# Patient Record
Sex: Female | Born: 1990
Health system: Southern US, Community
[De-identification: ages and names within clinical notes are randomized; demographics above are authoritative.]

## PROBLEM LIST (undated history)

## (undated) DIAGNOSIS — R519 Headache, unspecified: Secondary | ICD-10-CM

## (undated) DIAGNOSIS — G8929 Other chronic pain: Secondary | ICD-10-CM

## (undated) DIAGNOSIS — G43909 Migraine, unspecified, not intractable, without status migrainosus: Secondary | ICD-10-CM

## (undated) DIAGNOSIS — K589 Irritable bowel syndrome without diarrhea: Secondary | ICD-10-CM

## (undated) DIAGNOSIS — T7840XA Allergy, unspecified, initial encounter: Secondary | ICD-10-CM

## (undated) DIAGNOSIS — F419 Anxiety disorder, unspecified: Secondary | ICD-10-CM

## (undated) DIAGNOSIS — K219 Gastro-esophageal reflux disease without esophagitis: Secondary | ICD-10-CM

## (undated) DIAGNOSIS — F32A Depression, unspecified: Secondary | ICD-10-CM

## (undated) DIAGNOSIS — F909 Attention-deficit hyperactivity disorder, unspecified type: Secondary | ICD-10-CM

## (undated) HISTORY — DX: Anxiety disorder, unspecified: F41.9

## (undated) HISTORY — DX: Other chronic pain: G89.29

## (undated) HISTORY — DX: Headache, unspecified: R51.9

## (undated) HISTORY — DX: Gastro-esophageal reflux disease without esophagitis: K21.9

## (undated) HISTORY — DX: Migraine, unspecified, not intractable, without status migrainosus: G43.909

## (undated) HISTORY — DX: Depression, unspecified: F32.A

## (undated) HISTORY — DX: Allergy, unspecified, initial encounter: T78.40XA

## (undated) HISTORY — DX: Irritable bowel syndrome, unspecified: K58.9

---

## 2015-07-06 ENCOUNTER — Ambulatory Visit: Payer: Self-pay | Admitting: Family Medicine

## 2018-09-21 ENCOUNTER — Encounter: Payer: Self-pay | Admitting: Nurse Practitioner

## 2018-09-21 ENCOUNTER — Other Ambulatory Visit (HOSPITAL_COMMUNITY)
Admission: RE | Admit: 2018-09-21 | Discharge: 2018-09-21 | Disposition: A | Discharge status: Home / Self Care | Payer: 59 | Source: Ambulatory Visit | Attending: Nurse Practitioner | Admitting: Nurse Practitioner

## 2018-09-21 ENCOUNTER — Ambulatory Visit (INDEPENDENT_AMBULATORY_CARE_PROVIDER_SITE_OTHER): Payer: 59 | Admitting: Nurse Practitioner

## 2018-09-21 VITALS — BP 102/80 | HR 83 | Temp 98.1°F | Ht 67.0 in | Wt 161.0 lb

## 2018-09-21 DIAGNOSIS — Z0000 Encounter for general adult medical examination without abnormal findings: Secondary | ICD-10-CM

## 2018-09-21 DIAGNOSIS — Z136 Encounter for screening for cardiovascular disorders: Secondary | ICD-10-CM

## 2018-09-21 DIAGNOSIS — Z124 Encounter for screening for malignant neoplasm of cervix: Secondary | ICD-10-CM | POA: Insufficient documentation

## 2018-09-21 DIAGNOSIS — Z13220 Encounter for screening for lipoid disorders: Secondary | ICD-10-CM

## 2018-09-21 DIAGNOSIS — Z30011 Encounter for initial prescription of contraceptive pills: Secondary | ICD-10-CM | POA: Insufficient documentation

## 2018-09-21 LAB — COMPREHENSIVE METABOLIC PANEL
ALBUMIN: 4.2 g/dL (ref 3.5–5.2)
ALK PHOS: 57 U/L (ref 39–117)
ALT: 12 U/L (ref 0–35)
AST: 15 U/L (ref 0–37)
BUN: 7 mg/dL (ref 6–23)
CALCIUM: 9 mg/dL (ref 8.4–10.5)
CHLORIDE: 109 meq/L (ref 96–112)
CO2: 25 mEq/L (ref 19–32)
CREATININE: 0.65 mg/dL (ref 0.40–1.20)
GFR: 117.84 mL/min (ref >60.00)
Glucose, Bld: 95 mg/dL (ref 70–99)
POTASSIUM: 3.8 meq/L (ref 3.5–5.1)
SODIUM: 141 meq/L (ref 135–145)
Total Bilirubin: 0.4 mg/dL (ref 0.2–1.2)
Total Protein: 6.7 g/dL (ref 6.0–8.3)

## 2018-09-21 LAB — CBC
HEMATOCRIT: 37.1 % (ref 36.0–46.0)
HEMOGLOBIN: 12.6 g/dL (ref 12.0–15.0)
MCHC: 34 g/dL (ref 30.0–36.0)
MCV: 91 fl (ref 78.0–100.0)
PLATELETS: 266 10*3/uL (ref 150.0–400.0)
RBC: 4.07 Mil/uL (ref 3.87–5.11)
RDW: 12.3 % (ref 11.5–15.5)
WBC: 4.6 10*3/uL (ref 4.0–10.5)

## 2018-09-21 LAB — POCT URINE PREGNANCY: PREG TEST UR: NEGATIVE

## 2018-09-21 LAB — LIPID PANEL
CHOL/HDL RATIO: 3
Cholesterol: 125 mg/dL (ref 0–200)
HDL: 47.3 mg/dL (ref >39.00)
LDL Cholesterol: 65 mg/dL (ref 0–99)
NONHDL: 77.36
TRIGLYCERIDES: 60 mg/dL (ref 0.0–149.0)
VLDL: 12 mg/dL (ref 0.0–40.0)

## 2018-09-21 LAB — TSH: TSH: 2.29 u[IU]/mL (ref 0.35–4.50)

## 2018-09-21 MED ORDER — NORETHIN ACE-ETH ESTRAD-FE 1-20 MG-MCG PO TABS: 1.0000 | ORAL_TABLET | Freq: Every day | ORAL | 3 refills | Status: DC

## 2018-09-21 NOTE — Progress Notes (Signed)
Subjective:    Patient ID: Melanie Jordan, female    DOB: [birth year], 27 y.o.   MRN: [id number]  Patient presents today for complete physical and establish care.  HPI  no pcp since 2016  Chest wall pain: Onset 1week ago, intermittent, no known aggravating factor, no other associated symptoms, no related to exertion. Improves with deep breathing.  Hx of anxiety, denies any increased anxiety at this time, denies any depression.  Sexual History (orientation,birth control, marital status, STD):sexually active, no contraception, will like to resume oral contraception. No hx of abnormal pap smear, completed HPV vaccine in 2016 while in [place]  Depression/Suicide: Depression screen PHQ 2/9 09/21/2018  Decreased Interest 0  Down, Depressed, Hopeless 0  PHQ - 2 Score 0   Vision:will schedule  Dental:will schedule  Immunizations: (TDAP, Hep C screen, Pneumovax, Influenza, zoster)  Health Maintenance  Topic Date Due  . Pap Smear  07/06/2014  . HIV Screening  09/22/2019*  . Tetanus Vaccine  05/25/2025  . Flu Shot  Completed  *Topic was postponed. The date shown is not the original due date.   Diet:regular.  Weight:  Wt Readings from Last 3 Encounters:  09/21/18 161 lb (73 kg)   Exercise:walking,  Fall Risk: Fall Risk  09/21/2018  Falls in the past year? No   Home Safety:home alone  Medications and allergies reviewed with patient and updated if appropriate.  Patient Active Problem List   Diagnosis Date Noted  . Initiation of oral contraception 09/21/2018    No current outpatient medications on file prior to visit.   No current facility-administered medications on file prior to visit.     Past Medical History:  Diagnosis Date  . GERD (gastroesophageal reflux disease)     History reviewed. No pertinent surgical history.  Social History   Socioeconomic History  . Marital status: Single    Spouse name: Not on file  . Number of children: 0  . Years of  education: Not on file  . Highest education level: Associate degree: occupational, Hotel manager, or vocational program  Occupational History    Comment: radiology technician (inpatient)  Social Needs  . Financial resource strain: Not on file  . Food insecurity:    Worry: Not on file    Inability: Not on file  . Transportation needs:    Medical: Not on file    Non-medical: Not on file  Tobacco Use  . Smoking status: Never Smoker  . Smokeless tobacco: Never Used  Substance and Sexual Activity  . Alcohol use: Yes    Comment: social  . Drug use: Never  . Sexual activity: Yes    Birth control/protection: None  Lifestyle  . Physical activity:    Days per week: Not on file    Minutes per session: Not on file  . Stress: Not on file  Relationships  . Social connections:    Talks on phone: Not on file    Gets together: Not on file    Attends religious service: Not on file    Active member of club or organization: Not on file    Attends meetings of clubs or organizations: Not on file    Relationship status: Not on file  Other Topics Concern  . Not on file  Social History Narrative  . Not on file    Family History  Problem Relation Age of Onset  . Cancer Mother 30       cervical cancer  . Asthma Brother   .  Arthritis Maternal Grandmother   . Depression Maternal Grandmother   . Heart disease Maternal Grandmother 63       CAD with stents  . Hyperlipidemia Maternal Grandmother   . Hypertension Maternal Grandmother   . Stroke Maternal Grandmother 72       TIA  . Cancer Paternal Grandmother 65       uterine cancer  . Asthma Brother        Review of Systems  Constitutional: Negative for fever, malaise/fatigue and weight loss.  HENT: Negative for congestion and sore throat.   Eyes:       Negative for visual changes  Respiratory: Negative for cough and shortness of breath.   Cardiovascular: Positive for chest pain. Negative for palpitations, orthopnea and leg swelling.    Gastrointestinal: Negative for abdominal pain, blood in stool, constipation, diarrhea, heartburn, melena, nausea and vomiting.  Genitourinary: Negative for dysuria, frequency and urgency.  Musculoskeletal: Negative for falls, joint pain and myalgias.  Skin: Negative for rash.  Neurological: Negative for dizziness, sensory change and headaches.  Endo/Heme/Allergies: Does not bruise/bleed easily.  Psychiatric/Behavioral: Negative for depression, substance abuse and suicidal ideas. The patient is not nervous/anxious.     Objective:   Vitals:   09/21/18 0908  BP: 102/80  Pulse: 83  Temp: 98.1 F (36.7 C)  SpO2: 99%    Body mass index is 25.22 kg/m.   Physical Examination:  Physical Exam  Constitutional: She is oriented to person, place, and time. No distress.  HENT:  Right Ear: External ear normal.  Left Ear: External ear normal.  Nose: Nose normal.  Mouth/Throat: No oropharyngeal exudate.  Eyes: Pupils are equal, round, and reactive to light. Conjunctivae and EOM are normal. No scleral icterus.  Neck: Normal range of motion. Neck supple. No thyromegaly present.  Cardiovascular: Normal rate, regular rhythm, normal heart sounds and intact distal pulses.  Pulmonary/Chest: Effort normal and breath sounds normal. She exhibits no tenderness. Right breast exhibits no mass, no nipple discharge and no skin change. Left breast exhibits no mass, no nipple discharge and no skin change. Breasts are symmetrical.  Abdominal: Soft. Bowel sounds are normal. She exhibits no distension. There is no tenderness.  Genitourinary: Rectum normal and vagina normal. Pelvic exam was performed with patient supine. There is no rash, tenderness or lesion on the right labia. There is no rash, tenderness or lesion on the left labia. Cervix exhibits no motion tenderness, no discharge and no friability. No erythema in the vagina. No vaginal discharge found.  Genitourinary Comments: Chaperone present   Musculoskeletal: Normal range of motion. She exhibits no edema or tenderness.  Lymphadenopathy:    She has no cervical adenopathy. No inguinal adenopathy noted on the right side.  Neurological: She is alert and oriented to person, place, and time.  Skin: Skin is warm and dry. No rash noted.  Psychiatric: She has a normal mood and affect. Her behavior is normal. Thought content normal.  Vitals reviewed.   ASSESSMENT and PLAN:  Ricci was seen today for establish care.  Diagnoses and all orders for this visit:  Preventative health care -     CBC -     Comprehensive metabolic panel -     TSH -     Lipid panel  Encounter for lipid screening for cardiovascular disease -     Lipid panel  Initiation of oral contraception -     POCT urine pregnancy -     norethindrone-ethinyl estradiol (JUNEL FE,GILDESS FE,LOESTRIN FE) 1-20  MG-MCG tablet; Take 1 tablet by mouth daily.  Encounter for Papanicolaou smear for cervical cancer screening -     Cytology - PAP   No problem-specific Assessment & Plan notes found for this encounter.     Follow up: Return if symptoms worsen or fail to improve.  Wilfred Lacy, NP

## 2018-09-21 NOTE — Patient Instructions (Addendum)
Negative urine pregnancy. Normal lab results. Oral contraceptive sent Pending PAP.  Chest wall pain may be related to muscle strain. Symptoms do not indicate GERD or cardiovascular disease. Okay to use tylenol or ibuprofen for pain.  Preventive Care for Melanie Jordan, Female The transition to life after high school as a young adult can be a stressful time with many changes. You may start seeing a primary care physician instead of a pediatrician. This is the time when your health care becomes your responsibility. Preventive care refers to lifestyle choices and visits with your health care provider that can promote health and wellness. What does preventive care include?  A yearly physical exam. This is also called an annual wellness visit.  Dental exams once or twice a year.  Routine eye exams. Ask your health care provider how often you should have your eyes checked.  Personal lifestyle choices, including: ? Daily care of your teeth and gums. ? Regular physical activity. ? Eating a healthy diet. ? Avoiding tobacco and drug use. ? Avoiding or limiting alcohol use. ? Practicing safe sex. ? Taking vitamin and mineral supplements as recommended by your health care provider. What happens during an annual wellness visit? Preventive care starts with a yearly visit to your primary care physician. The services and screenings done by your health care provider during your annual wellness visit will depend on your overall health, lifestyle risk factors, and family history of disease. Counseling Your health care provider may ask you questions about:  Past medical problems and your family's medical history.  Medicines or supplements you take.  Health insurance and access to health care.  Alcohol, tobacco, and drug use.  Your safety at home, work, or school.  Access to firearms.  Emotional well-being and how you cope with stress.  Relationship well-being.  Diet, exercise, and sleep  habits.  Your sexual health and activity.  Your methods of birth control.  Your menstrual cycle.  Your pregnancy history.  Screening You may have the following tests or measurements:  Height, weight, and BMI.  Blood pressure.  Lipid and cholesterol levels.  Tuberculosis skin test.  Skin exam.  Vision and hearing tests.  Screening test for hepatitis.  Screening tests for sexually transmitted diseases (STDs), if you are at risk.  BRCA-related cancer screening. This may be done if you have a family history of breast, ovarian, tubal, or peritoneal cancers.  Pelvic exam and Pap test. This may be done every 3 years starting at age 31.  Vaccines Your health care provider may recommend certain vaccines, such as:  Influenza vaccine. This is recommended every year.  Tetanus, diphtheria, and acellular pertussis (Tdap, Td) vaccine. You may need a Td booster every 10 years.  Varicella vaccine. You may need this if you have not been vaccinated.  HPV vaccine. If you are 17 or younger, you may need three doses over 6 months.  Measles, mumps, and rubella (MMR) vaccine. You may need at least one dose of MMR. You may also need a second dose.  Pneumococcal 13-valent conjugate (PCV13) vaccine. You may need this if you have certain conditions and were not previously vaccinated.  Pneumococcal polysaccharide (PPSV23) vaccine. You may need one or two doses if you smoke cigarettes or if you have certain conditions.  Meningococcal vaccine. One dose is recommended if you are age 7-21 years and a first-year college student living in a residence hall, or if you have one of several medical conditions. You may also need additional booster doses.  Hepatitis A vaccine. You may need this if you have certain conditions or if you travel or work in places where you may be exposed to hepatitis A.  Hepatitis B vaccine. You may need this if you have certain conditions or if you travel or work in places  where you may be exposed to hepatitis B.  Haemophilus influenzae type b (Hib) vaccine. You may need this if you have certain risk factors.  Talk to your health care provider about which screenings and vaccines you need and how often you need them. What steps can I take to develop healthy behaviors?  Have regular preventive health care visits with your primary care physician and dentist.  Eat a healthy diet.  Drink enough fluid to keep your urine clear or pale yellow.  Stay active. Exercise at least 30 minutes 5 or more days of the week.  Use alcohol responsibly.  Maintain a healthy weight.  Do not use any products that contain nicotine, such as cigarettes, chewing tobacco, and e-cigarettes. If you need help quitting, ask your health care provider.  Do not use drugs.  Practice safe sex.  Use birth control (contraception) to prevent unwanted pregnancy. If you plan to become pregnant, see your health care provider for a pre-conception visit.  Find healthy ways to manage stress. How can I protect myself from injury? Injuries from violence or accidents are the leading cause of death among young adults and can often be prevented. Take these steps to help protect yourself:  Always wear your seat belt while driving or riding in a vehicle.  Do not drive if you have been drinking alcohol. Do not ride with someone who has been drinking.  Do not drive when you are tired or distracted. Do not text while driving.  Wear a helmet and other protective equipment during sports activities.  If you have firearms in your house, make sure you follow all gun safety procedures.  Seek help if you have been bullied, physically abused, or sexually abused.  Use the Internet responsibly to avoid dangers such as online bullying and online sexual predators.  What can I do to cope with stress? Young adults may face many new challenges that can be stressful, such as finding a job, going to college,  moving away from home, managing money, being in a relationship, getting married, and having children. To manage stress:  Avoid known stressful situations when you can.  Exercise regularly.  Find a stress-reducing activity that works best for you. Examples include meditation, yoga, listening to music, or reading.  Spend time in nature.  Keep a journal to write about your stress and how you respond.  Talk to your health care provider about stress. He or she may suggest counseling.  Spend time with supportive friends or family.  Do not cope with stress by: ? Drinking alcohol or using drugs. ? Smoking cigarettes. ? Eating.  Where can I get more information? Learn more about preventive care and healthy habits from:  [employer] and [employer]necologists: KaraokeExchange.nl  U.S. Probation officer Task Force: StageSync.si  National Adolescent and [place]: StrategicRoad.nl  American Academy of Pediatrics Bright Futures: https://brightfutures.MemberVerification.co.za  Society for Adolescent Health and Medicine: MoralBlog.co.za.aspx  PodExchange.nl: ToyLending.fr  This information is not intended to replace advice given to you by your health care provider. Make sure you discuss any questions you have with your health care provider. Document Released: 03/28/2016 Document Revised: 04/18/2016 Document Reviewed: 03/28/2016 Elsevier Interactive Patient Education  2018 [place].

## 2018-09-23 LAB — CYTOLOGY - PAP
CHLAMYDIA, DNA PROBE: NEGATIVE
Diagnosis: NEGATIVE
NEISSERIA GONORRHEA: NEGATIVE
Trichomonas: NEGATIVE

## 2018-11-25 DIAGNOSIS — Z9889 Other specified postprocedural states: Secondary | ICD-10-CM

## 2018-11-25 HISTORY — DX: Other specified postprocedural states: Z98.890

## 2019-02-23 DIAGNOSIS — Z79899 Other long term (current) drug therapy: Secondary | ICD-10-CM | POA: Diagnosis not present

## 2019-02-23 DIAGNOSIS — F902 Attention-deficit hyperactivity disorder, combined type: Secondary | ICD-10-CM | POA: Diagnosis not present

## 2019-02-23 DIAGNOSIS — F4010 Social phobia, unspecified: Secondary | ICD-10-CM | POA: Diagnosis not present

## 2019-03-27 ENCOUNTER — Emergency Department (HOSPITAL_COMMUNITY): Payer: 59

## 2019-03-27 ENCOUNTER — Other Ambulatory Visit: Payer: Self-pay

## 2019-03-27 ENCOUNTER — Encounter (HOSPITAL_COMMUNITY): Payer: Self-pay | Admitting: Emergency Medicine

## 2019-03-27 ENCOUNTER — Emergency Department (HOSPITAL_COMMUNITY)
Admission: EM | Admit: 2019-03-27 | Discharge: 2019-03-28 | Disposition: A | Discharge status: Home / Self Care | Payer: 59 | Attending: Emergency Medicine | Admitting: Emergency Medicine

## 2019-03-27 DIAGNOSIS — M25512 Pain in left shoulder: Secondary | ICD-10-CM | POA: Insufficient documentation

## 2019-03-27 DIAGNOSIS — R42 Dizziness and giddiness: Secondary | ICD-10-CM | POA: Insufficient documentation

## 2019-03-27 DIAGNOSIS — R0789 Other chest pain: Secondary | ICD-10-CM | POA: Diagnosis not present

## 2019-03-27 DIAGNOSIS — R079 Chest pain, unspecified: Secondary | ICD-10-CM | POA: Diagnosis not present

## 2019-03-27 DIAGNOSIS — R61 Generalized hyperhidrosis: Secondary | ICD-10-CM | POA: Insufficient documentation

## 2019-03-27 DIAGNOSIS — M542 Cervicalgia: Secondary | ICD-10-CM | POA: Insufficient documentation

## 2019-03-27 DIAGNOSIS — F909 Attention-deficit hyperactivity disorder, unspecified type: Secondary | ICD-10-CM | POA: Insufficient documentation

## 2019-03-27 DIAGNOSIS — Z79899 Other long term (current) drug therapy: Secondary | ICD-10-CM | POA: Insufficient documentation

## 2019-03-27 HISTORY — DX: Attention-deficit hyperactivity disorder, unspecified type: F90.9

## 2019-03-27 LAB — BASIC METABOLIC PANEL
Anion gap: 12 (ref 5–15)
BUN: 6 mg/dL (ref 6–20)
CO2: 20 mmol/L — ABNORMAL LOW (ref 22–32)
Calcium: 9.3 mg/dL (ref 8.9–10.3)
Chloride: 104 mmol/L (ref 98–111)
Creatinine, Ser: 0.78 mg/dL (ref 0.44–1.00)
GFR calc Af Amer: >60 mL/min (ref >60)
GFR calc non Af Amer: >60 mL/min (ref >60)
Glucose, Bld: 95 mg/dL (ref 70–99)
Potassium: 3.2 mmol/L — ABNORMAL LOW (ref 3.5–5.1)
Sodium: 136 mmol/L (ref 135–145)

## 2019-03-27 LAB — CBC
HCT: 38.3 % (ref 36.0–46.0)
Hemoglobin: 12.8 g/dL (ref 12.0–15.0)
MCH: 30.5 pg (ref 26.0–34.0)
MCHC: 33.4 g/dL (ref 30.0–36.0)
MCV: 91.4 fL (ref 80.0–100.0)
Platelets: 237 10*3/uL (ref 150–400)
RBC: 4.19 MIL/uL (ref 3.87–5.11)
RDW: 11.8 % (ref 11.5–15.5)
WBC: 7.5 10*3/uL (ref 4.0–10.5)
nRBC: 0 % (ref 0.0–0.2)

## 2019-03-27 LAB — TROPONIN I: Troponin I: <0.03 ng/mL (ref <0.03)

## 2019-03-27 LAB — I-STAT BETA HCG BLOOD, ED (MC, WL, AP ONLY): I-stat hCG, quantitative: <5 m[IU]/mL (ref <5)

## 2019-03-27 MED ORDER — SODIUM CHLORIDE 0.9% FLUSH: 3.0000 mL | Freq: Once | INTRAVENOUS | Status: DC

## 2019-03-27 NOTE — ED Triage Notes (Signed)
Pt. From home c/o sudden CP starting yesterday 5:30 pm for about 4 hours restarting this afternoon around 9:20 pm ongoing. CP "feels like tightness" 5 on scale 0-10 left side radiating to left arm and neck. Pt. Stated feeling nausea and weakness. Pt. Denies V/SOB.

## 2019-03-28 DIAGNOSIS — R42 Dizziness and giddiness: Secondary | ICD-10-CM | POA: Diagnosis not present

## 2019-03-28 DIAGNOSIS — R0789 Other chest pain: Secondary | ICD-10-CM | POA: Diagnosis not present

## 2019-03-28 DIAGNOSIS — Z79899 Other long term (current) drug therapy: Secondary | ICD-10-CM | POA: Diagnosis not present

## 2019-03-28 DIAGNOSIS — F909 Attention-deficit hyperactivity disorder, unspecified type: Secondary | ICD-10-CM | POA: Diagnosis not present

## 2019-03-28 DIAGNOSIS — G43809 Other migraine, not intractable, without status migrainosus: Secondary | ICD-10-CM | POA: Insufficient documentation

## 2019-03-28 DIAGNOSIS — R61 Generalized hyperhidrosis: Secondary | ICD-10-CM | POA: Diagnosis not present

## 2019-03-28 DIAGNOSIS — M25512 Pain in left shoulder: Secondary | ICD-10-CM | POA: Diagnosis not present

## 2019-03-28 DIAGNOSIS — M542 Cervicalgia: Secondary | ICD-10-CM | POA: Diagnosis not present

## 2019-03-28 LAB — D-DIMER, QUANTITATIVE: D-Dimer, Quant: <0.27 ug/mL-FEU (ref 0.00–0.50)

## 2019-03-28 MED ORDER — POTASSIUM CHLORIDE CRYS ER 20 MEQ PO TBCR
40.0000 meq | EXTENDED_RELEASE_TABLET | Freq: Once | ORAL | Status: AC
  Administered 2019-03-28: 40 meq via ORAL
  Filled 2019-03-28: qty 2

## 2019-03-28 NOTE — ED Provider Notes (Signed)
[facility] DEPART[facility] Provider Note   CSN: [id number] Arrival date & time: 03/27/19  2214    History   Chief Complaint Chief Complaint  Patient presents with  . Chest Pain    HPI Melanie Jordan is a 28 y.o. female.     The history is provided by the patient. No language interpreter was used.  Chest Pain  Pain location:  L chest Pain quality: sharp   Pain radiates to:  Neck and L shoulder Pain severity:  Moderate Onset quality:  Sudden Duration: a few hours before subsiding. Timing:  Sporadic Progression:  Unchanged Chronicity:  New Context: not breathing, not eating and not trauma   Relieved by:  Nothing Ineffective treatments:  None tried Associated symptoms: diaphoresis and dizziness   Associated symptoms: no fever, no lower extremity edema, no syncope and no vomiting   Risk factors: no hypertension, not obese, no prior DVT/PE and no smoking   Risk factors comment:  No FHx of sudden cardiac death   Past Medical History:  Diagnosis Date  . ADHD   . GERD (gastroesophageal reflux disease)     Patient Active Problem List   Diagnosis Date Noted  . Initiation of oral contraception 09/21/2018    History reviewed. No pertinent surgical history.   OB History   No obstetric history on file.      Home Medications    Prior to Admission medications   Medication Sig Start Date End Date Taking? Authorizing Provider  amphetamine-dextroamphetamine (ADDERALL XR) 30 MG 24 hr capsule Take 30 mg by mouth daily. 02/23/19  Yes [provider]  norethindrone-ethinyl estradiol (JUNEL FE,GILDESS FE,LOESTRIN FE) 1-20 MG-MCG tablet Take 1 tablet by mouth daily. 09/21/18  Yes Nche, Charlene Brooke, NP    Family History Family History  Problem Relation Age of Onset  . Cancer Mother 30       cervical cancer  . Asthma Brother   . Arthritis Maternal Grandmother   . Depression Maternal Grandmother   . Heart disease Maternal Grandmother  63       CAD with stents  . Hyperlipidemia Maternal Grandmother   . Hypertension Maternal Grandmother   . Stroke Maternal Grandmother 72       TIA  . Cancer Paternal Grandmother 11       uterine cancer  . Asthma Brother     Social History Social History   Tobacco Use  . Smoking status: Never Smoker  . Smokeless tobacco: Never Used  Substance Use Topics  . Alcohol use: Yes    Comment: social  . Drug use: Never     Allergies   Patient has no known allergies.   Review of Systems Review of Systems  Constitutional: Positive for diaphoresis. Negative for fever.  Cardiovascular: Positive for chest pain. Negative for syncope.  Gastrointestinal: Negative for vomiting.  Neurological: Positive for dizziness.  Ten systems reviewed and are negative for acute change, except as noted in the HPI.    Physical Exam Updated Vital Signs BP (!) 103/57   Pulse 72   Temp 98 F (36.7 C)   Resp 16   Ht 5\' 7"  (1.702 m)   Wt 71.2 kg   SpO2 100%   BMI 24.59 kg/m   Physical Exam Vitals signs and nursing note reviewed.  Constitutional:      General: She is not in acute distress.    Appearance: She is well-developed. She is not diaphoretic.     Comments: Nontoxic appearing  and in NAD  HENT:     Head: Normocephalic and atraumatic.  Eyes:     General: No scleral icterus.    Conjunctiva/sclera: Conjunctivae normal.  Neck:     Musculoskeletal: Normal range of motion.  Cardiovascular:     Rate and Rhythm: Normal rate and regular rhythm.     Pulses: Normal pulses.  Pulmonary:     Effort: Pulmonary effort is normal. No respiratory distress.     Breath sounds: No stridor.     Comments: Respirations even and unlabored Chest:     Chest wall: Tenderness (L mid chest wall) present.  Musculoskeletal: Normal range of motion.  Skin:    General: Skin is warm and dry.     Coloration: Skin is not pale.     Findings: No erythema or rash.  Neurological:     General: No focal deficit  present.     Mental Status: She is alert and oriented to person, place, and time.     Coordination: Coordination normal.     Comments: GCS 15. Moving all extremities spontaneously.  Psychiatric:        Behavior: Behavior normal.      ED Treatments / Results  Labs (all labs ordered are listed, but only abnormal results are displayed) Labs Reviewed  BASIC METABOLIC PANEL - Abnormal; Notable for the following components:      Result Value   Potassium 3.2 (*)    CO2 20 (*)    All other components within normal limits  CBC  TROPONIN I  D-DIMER, QUANTITATIVE (NOT AT [facility]  I-STAT BETA HCG BLOOD, ED ([facility], [facility], [facility] ONLY)    EKG EKG Interpretation  Date/Time:  Saturday Mar 27 2019 22:17:13 EDT Ventricular Rate:  98 PR Interval:  164 QRS Duration: 86 QT Interval:  344 QTC Calculation: 439 R Axis:   105 Text Interpretation:  Normal sinus rhythm with sinus arrhythmia Right atrial enlargement Rightward axis Borderline ECG No old tracing to compare Confirmed by Delora Fuel ([id number]) on 03/28/2019 12:04:22 AM   Radiology Dg Chest 2 View  Result Date: 03/27/2019 CLINICAL DATA:  Initial evaluation for acute chest pain. EXAM: CHEST - 2 VIEW COMPARISON:  None. FINDINGS: The cardiac and mediastinal silhouettes are within normal limits. The lungs are normally inflated. No airspace consolidation, pleural effusion, or pulmonary edema is identified. There is no pneumothorax. No acute osseous abnormality identified. IMPRESSION: No active cardiopulmonary disease. Electronically Signed   By: Jeannine Boga M.D.   On: 03/27/2019 22:44    Procedures Procedures (including critical care time)  Medications Ordered in ED Medications  sodium chloride flush (NS) 0.9 % injection 3 mL (has no administration in time range)  potassium chloride SA (K-DUR) CR tablet 40 mEq (40 mEq Oral Given 03/28/19 0152)     Initial Impression / Assessment and Plan / ED Course  I have reviewed the triage vital signs and  the nursing notes.  Pertinent labs & imaging results that were available during my care of the patient were reviewed by me and considered in my medical decision making (see chart for details).        Patient presents to the emergency department for evaluation of chest pain. Pain is sharp, will begin suddenly in the L chest; often preceded by dizziness and diaphoresis. Patient asymptomatic at present. Low suspicion for emergent cardiac etiology given reassuring workup today.  EKG is nonischemic and troponin negative.  Chest x-ray without evidence of mediastinal widening to suggest dissection.  No  pneumothorax, pneumonia, pleural effusion.  Pulmonary embolus further considered; however, patient without tachycardia, tachypnea, dyspnea, hypoxia.  D-dimer is negative.  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  Given reproducible pain on palpation, suspect musculoskeletal etiology; ?muscle spasm.  I have encouraged home use of Tylenol or Motrin.  Patient to follow-up with a primary care doctor.  Return precautions discussed and provided. Patient discharged in stable condition with no unaddressed concerns.   Final Clinical Impressions(s) / ED Diagnoses   Final diagnoses:  Nonspecific chest pain    ED Discharge Orders    None       Antonietta Breach, PA-C 50/38/88 2800    Delora Fuel, MD 34/91/79 [phone]

## 2019-03-28 NOTE — Discharge Instructions (Signed)
Your work-up in the emergency department was reassuring.  We recommend Tylenol or ibuprofen for management of ongoing pain.  Follow-up with your primary care doctor.  You may return for new or concerning symptoms.

## 2019-04-02 ENCOUNTER — Ambulatory Visit (INDEPENDENT_AMBULATORY_CARE_PROVIDER_SITE_OTHER): Payer: 59 | Admitting: Nurse Practitioner

## 2019-04-02 ENCOUNTER — Encounter: Payer: Self-pay | Admitting: Nurse Practitioner

## 2019-04-02 ENCOUNTER — Other Ambulatory Visit (HOSPITAL_COMMUNITY)
Admission: RE | Admit: 2019-04-02 | Discharge: 2019-04-02 | Disposition: A | Discharge status: Home / Self Care | Payer: 59 | Source: Ambulatory Visit | Attending: Nurse Practitioner | Admitting: Nurse Practitioner

## 2019-04-02 VITALS — BP 126/74 | HR 93 | Temp 98.7°F | Ht 67.0 in | Wt 157.0 lb

## 2019-04-02 DIAGNOSIS — F4322 Adjustment disorder with anxiety: Secondary | ICD-10-CM | POA: Diagnosis not present

## 2019-04-02 DIAGNOSIS — N946 Dysmenorrhea, unspecified: Secondary | ICD-10-CM

## 2019-04-02 DIAGNOSIS — R102 Pelvic and perineal pain: Secondary | ICD-10-CM | POA: Diagnosis not present

## 2019-04-02 MED ORDER — BUSPIRONE HCL 15 MG PO TABS: 15.0000 mg | ORAL_TABLET | Freq: Two times a day (BID) | ORAL | 1 refills | Status: DC

## 2019-04-02 MED ORDER — IBUPROFEN 600 MG PO TABS: 600.0000 mg | ORAL_TABLET | Freq: Three times a day (TID) | ORAL | 0 refills | Status: DC | PRN

## 2019-04-02 NOTE — Progress Notes (Signed)
Virtual Visit via Video Note  I connected with Melanie Jordan on 04/02/19 at  2:00 PM EDT by a video enabled telemedicine application and verified that I am speaking with the correct person using two identifiers.  Location: Patient: Home Provider: Office.   I discussed the limitations of evaluation and management by telemedicine and the availability of in person appointments. The patient expressed understanding and agreed to proceed.  CC:ED follow up for chest pain--pt report still have some pain,discomfort and dizzy at times--not as bad/ ED did lab and EKG--normal 2)pt is complaining of pelvis pain,spotting,sharp pain lower abd area/ 3 days/last period was 03/18/2019 very light,never have spotting before--ED didn preg test--normal/ NO GYN  History of Present Illness: Pelvic Pain  The patient's primary symptoms include pelvic pain, vaginal bleeding and vaginal discharge. The patient's pertinent negatives include no genital lesions, genital odor, genital rash or missed menses. This is a new problem. The current episode started in the past 7 days. The problem occurs constantly. The problem has been unchanged. The problem affects both sides. She is not pregnant. Associated symptoms include nausea. Pertinent negatives include no anorexia, back pain, chills, constipation, diarrhea, fever, flank pain, frequency, headaches, painful intercourse, rash, urgency or vomiting. The vaginal discharge was mucoid. The vaginal bleeding is spotting. She has not been passing clots. She has not been passing tissue. Nothing aggravates the symptoms. She has tried nothing for the symptoms. She is sexually active. It is unknown whether or not her partner has an STD. She uses oral contraceptives for contraception. Her menstrual history has been irregular. There is no history of PID or an STD.  Chest Pain   This is a chronic problem. The current episode started in the past 7 days. The onset quality is gradual. The problem  occurs intermittently. The problem has been waxing and waning. The pain is present in the substernal region. The quality of the pain is described as pressure and tightness. The pain radiates to the left arm. Associated symptoms include dizziness and nausea. Pertinent negatives include no back pain, claudication, cough, diaphoresis, exertional chest pressure, fever, headaches, hemoptysis, irregular heartbeat, leg pain, lower extremity edema, malaise/fatigue, near-syncope, numbness, orthopnea, palpitations, PND, shortness of breath, sputum production, syncope, vomiting or weakness. Risk factors include stress and oral contraceptive use.  Pertinent negatives for past medical history include no CAD, no COPD, no diabetes, no DVT, no hyperlipidemia, no hypertension, no sleep apnea, no strokes and no thyroid problem.  Her family medical history is significant for heart disease, hyperlipidemia, hypertension and stroke.  Pertinent negatives for family medical history include: no CAD, no diabetes and no sudden death. Prior diagnostic workup includes chest x-ray.  evaluated in ED for chest pain 03/27/2019, negative troponin and D-dimer. Had similar symptoms in past with no known trigger. Hx of anxiety, no previous medication used or counseling. Use of prozac in past (caused jittery sensation).  Adderall on hold for next 3days per patient. Medication managed by Dr. Johnnye Sima  Observations/Objective: Physical Exam  Constitutional: She is oriented to person, place, and time. No distress.  Pulmonary/Chest: Effort normal.  Neurological: She is alert and oriented to person, place, and time.  Vitals reviewed.   Assessment and Plan: Melanie Jordan was seen today for follow-up and hip pain.  Diagnoses and all orders for this visit:  Adjustment disorder with anxious mood -     busPIRone (BUSPAR) 15 MG tablet; Take 1 tablet (15 mg total) by mouth 2 (two) times daily.  Suprapubic abdominal pain -  Urinalysis w  microscopic + reflex cultur -     Urine cytology ancillary only([facility]) -     ibuprofen (ADVIL) 600 MG tablet; Take 1 tablet (600 mg total) by mouth every 8 (eight) hours as needed. With food  Dysmenorrhea -     ibuprofen (ADVIL) 600 MG tablet; Take 1 tablet (600 mg total) by mouth every 8 (eight) hours as needed. With food   Follow Up Instructions: Normal urinalysis. Pending urine cytology Use ibuprofen for pain. Use Buspar for anxiety. Contact [facility] or [place] for immediate counseling needs. May also use headspace app if needed. F/up in 2weeks   I discussed the assessment and treatment plan with the patient. The patient was provided an opportunity to ask questions and all were answered. The patient agreed with the plan and demonstrated an understanding of the instructions.   The patient was advised to call back or seek an in-person evaluation if the symptoms worsen or if the condition fails to improve as anticipated.   Wilfred Lacy, NP

## 2019-04-02 NOTE — Patient Instructions (Signed)
Go to lab for urine collection. Use ibuprofen for pain. Use Buspar for anxiety. Contact [facility] or [place] for immediate counseling needs. May also use headspace app if needed. F/up in 2weeks

## 2019-04-03 LAB — URINALYSIS W MICROSCOPIC + REFLEX CULTURE
Bilirubin Urine: NEGATIVE
Glucose, UA: NEGATIVE
Hyaline Cast: NONE SEEN /LPF
Ketones, ur: NEGATIVE
Leukocyte Esterase: NEGATIVE
Nitrites, Initial: NEGATIVE
Protein, ur: NEGATIVE
Specific Gravity, Urine: 1.022 (ref 1.001–1.03)
pH: >=8.5 — AB (ref 5.0–8.0)

## 2019-04-03 LAB — NO CULTURE INDICATED

## 2019-04-05 ENCOUNTER — Encounter: Payer: Self-pay | Admitting: Nurse Practitioner

## 2019-04-06 ENCOUNTER — Encounter: Payer: Self-pay | Admitting: Nurse Practitioner

## 2019-04-06 DIAGNOSIS — F4322 Adjustment disorder with anxiety: Secondary | ICD-10-CM

## 2019-04-06 LAB — URINE CYTOLOGY ANCILLARY ONLY
Chlamydia: NEGATIVE
Neisseria Gonorrhea: NEGATIVE
Trichomonas: NEGATIVE

## 2019-04-07 MED ORDER — BUPROPION HCL ER (XL) 150 MG PO TB24: 150.0000 mg | ORAL_TABLET | Freq: Every day | ORAL | 1 refills | Status: DC

## 2019-04-11 ENCOUNTER — Encounter: Payer: Self-pay | Admitting: Nurse Practitioner

## 2019-04-12 ENCOUNTER — Encounter: Payer: Self-pay | Admitting: Nurse Practitioner

## 2019-04-12 ENCOUNTER — Telehealth: Payer: 59 | Admitting: Nurse Practitioner

## 2019-04-12 ENCOUNTER — Other Ambulatory Visit: Payer: Self-pay

## 2019-04-12 VITALS — BP 146/87 | HR 94 | Temp 98.9°F | Ht 67.0 in | Wt 158.0 lb

## 2019-04-12 DIAGNOSIS — E876 Hypokalemia: Secondary | ICD-10-CM

## 2019-04-12 DIAGNOSIS — R42 Dizziness and giddiness: Secondary | ICD-10-CM

## 2019-04-12 DIAGNOSIS — F4322 Adjustment disorder with anxiety: Secondary | ICD-10-CM

## 2019-04-12 DIAGNOSIS — Z79899 Other long term (current) drug therapy: Secondary | ICD-10-CM | POA: Diagnosis not present

## 2019-04-12 DIAGNOSIS — F902 Attention-deficit hyperactivity disorder, combined type: Secondary | ICD-10-CM | POA: Diagnosis not present

## 2019-04-12 NOTE — Progress Notes (Unsigned)
Virtual Visit via Video Note  I connected with Melanie Jordan on 04/12/19 at  3:00 PM EDT by a video enabled telemedicine application and verified that I am speaking with the correct person using two identifiers.  Location: Patient: *** Provider: ***   I discussed the limitations of evaluation and management by telemedicine and the availability of in person appointments. The patient expressed understanding and agreed to proceed.  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   History of Present Illness:  2 episodes of dizziness: while sitting at work and at bedtime, each lasting 42mins-1hr, Resolved spontaneoulse, associated with palpitations and clammy sensation. Did not start wellutrin as prescribed. Stopped buspar BP Readings from Last 3 Encounters:  04/12/19 (!) 146/87  04/02/19 126/74  03/28/19 (!) 103/57   Wt Readings from Last 3 Encounters:  04/12/19 158 lb (71.7 kg)  04/02/19 157 lb (71.2 kg)  03/27/19 157 lb (71.2 kg)   Observations/Objective: Physical Exam  Constitutional: She is oriented to person, place, and time.  Cardiovascular: Normal rate.  Pulmonary/Chest: Effort normal.  Neurological: She is alert and oriented to person, place, and time.  Psychiatric: She has a normal mood and affect. Her behavior is normal. Thought content normal.  Vitals reviewed.  Assessment and Plan: Melanie Jordan was seen today for dizziness.  Diagnoses and all orders for this visit:  Adjustment disorder with anxious mood -     Basic metabolic panel -     Magnesium -     Sedimentation rate -     TSH  Dizziness -     Cancel: TSH -     Cancel: Sedimentation rate -     Cancel: Basic metabolic panel -     Cancel: Magnesium -     Basic metabolic panel -     Magnesium -     Sedimentation rate -     TSH  Hypokalemia -     Cancel: Basic metabolic panel -     Basic metabolic panel -      Magnesium -     Sedimentation rate -     TSH    Follow Up Instructions: Got to lab for blood draw (will be accompanied by her boyfriend) Start wellbutrin daily and xanax prn  if labs are normal    I discussed the assessment and treatment plan with the patient. The patient was provided an opportunity to ask questions and all were answered. The patient agreed with the plan and demonstrated an understanding of the instructions.   The patient was advised to call back or seek an in-person evaluation if the symptoms worsen or if the condition fails to improve as anticipated.   Wilfred Lacy, NP

## 2019-04-13 ENCOUNTER — Encounter: Payer: Self-pay | Admitting: Nurse Practitioner

## 2019-04-13 DIAGNOSIS — F4322 Adjustment disorder with anxiety: Secondary | ICD-10-CM

## 2019-04-13 LAB — MAGNESIUM: Magnesium: 2.2 mg/dL (ref 1.5–2.5)

## 2019-04-13 LAB — BASIC METABOLIC PANEL
BUN/Creatinine Ratio: 8 (calc) (ref 6–22)
BUN: 6 mg/dL — ABNORMAL LOW (ref 7–25)
CO2: 26 mmol/L (ref 20–32)
Calcium: 9.4 mg/dL (ref 8.6–10.2)
Chloride: 109 mmol/L (ref 98–110)
Creat: 0.73 mg/dL (ref 0.50–1.10)
Glucose, Bld: 107 mg/dL — ABNORMAL HIGH (ref 65–99)
Potassium: 3.9 mmol/L (ref 3.5–5.3)
Sodium: 143 mmol/L (ref 135–146)

## 2019-04-13 LAB — SEDIMENTATION RATE

## 2019-04-13 LAB — TSH: TSH: 1.87 mIU/L

## 2019-04-13 MED ORDER — ALPRAZOLAM 0.25 MG PO TABS: 0.2500 mg | ORAL_TABLET | Freq: Every evening | ORAL | 0 refills | Status: DC | PRN

## 2019-04-13 NOTE — Patient Instructions (Signed)
Normal lab results. Please start well butrin as discussed.  also sent temporal alprazolam prescription to use during panic attack. F/up in 2weeks (video appt)

## 2019-04-13 NOTE — Progress Notes (Incomplete)
Virtual Visit via Video Note  I connected with Melanie Jordan on 04/12/19 at  3:00 PM EDT by a video enabled telemedicine application and verified that I am speaking with the correct person using two identifiers.  Location: Patient: home Provider: office   I discussed the limitations of evaluation and management by telemedicine and the availability of in person appointments. The patient expressed understanding and agreed to proceed.  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   History of Present Illness:  2 episodes of dizziness: while sitting at work and at bedtime, each lasting 67mins-1hr, Resolved spontaneoulse, associated with palpitations and clammy sensation. Did not start wellutrin as prescribed. Stopped buspar BP Readings from Last 3 Encounters:  04/12/19 (!) 146/87  04/02/19 126/74  03/28/19 (!) 103/57   Wt Readings from Last 3 Encounters:  04/12/19 158 lb (71.7 kg)  04/02/19 157 lb (71.2 kg)  03/27/19 157 lb (71.2 kg)   Observations/Objective: Physical Exam  Constitutional: She is oriented to person, place, and time.  Cardiovascular: Normal rate.  Pulmonary/Chest: Effort normal.  Neurological: She is alert and oriented to person, place, and time.  Psychiatric: She has a normal mood and affect. Her behavior is normal. Thought content normal.  Vitals reviewed.  Assessment and Plan: Melanie Jordan was seen today for dizziness.  Diagnoses and all orders for this visit:  Adjustment disorder with anxious mood -     Basic metabolic panel -     Magnesium -     Sedimentation rate -     TSH  Dizziness -     Cancel: TSH -     Cancel: Sedimentation rate -     Cancel: Basic metabolic panel -     Cancel: Magnesium -     Basic metabolic panel -     Magnesium -     Sedimentation rate -     TSH  Hypokalemia -     Cancel: Basic metabolic panel -     Basic metabolic  panel -     Magnesium -     Sedimentation rate -     TSH    Follow Up Instructions: Normal lab results. Please start well butrin as discussed.  also sent temporal alprazolam prescription to use during panic attack. F/up in 2weeks (video appt)  I discussed the assessment and treatment plan with the patient. The patient was provided an opportunity to ask questions and all were answered. The patient agreed with the plan and demonstrated an understanding of the instructions.   The patient was advised to call back or seek an in-person evaluation if the symptoms worsen or if the condition fails to improve as anticipated.   Wilfred Lacy, NP

## 2019-04-14 ENCOUNTER — Telehealth: Payer: Self-pay

## 2019-04-14 NOTE — Telephone Encounter (Signed)
Pt is aware.  

## 2019-04-15 ENCOUNTER — Other Ambulatory Visit: Payer: 59

## 2019-04-15 ENCOUNTER — Encounter: Payer: Self-pay | Admitting: Family Medicine

## 2019-04-15 ENCOUNTER — Ambulatory Visit (INDEPENDENT_AMBULATORY_CARE_PROVIDER_SITE_OTHER): Payer: 59 | Admitting: Family Medicine

## 2019-04-15 VITALS — BP 110/78 | HR 86 | Temp 98.5°F | Ht 67.0 in | Wt 156.0 lb

## 2019-04-15 DIAGNOSIS — H8303 Labyrinthitis, bilateral: Secondary | ICD-10-CM

## 2019-04-15 DIAGNOSIS — R42 Dizziness and giddiness: Secondary | ICD-10-CM

## 2019-04-15 DIAGNOSIS — E876 Hypokalemia: Secondary | ICD-10-CM | POA: Diagnosis not present

## 2019-04-15 DIAGNOSIS — H6983 Other specified disorders of Eustachian tube, bilateral: Secondary | ICD-10-CM

## 2019-04-15 LAB — SEDIMENTATION RATE: Sed Rate: 6 mm/h (ref 0–20)

## 2019-04-15 MED ORDER — PREDNISONE 20 MG PO TABS: ORAL_TABLET | ORAL | 0 refills | Status: DC

## 2019-04-15 NOTE — Progress Notes (Signed)
Melanie Jordan is a 28 y.o. female  Chief Complaint  Patient presents with  . Dizziness    pressure in face and ears/ x4days/ pt complains of dizziness when sitting is worse/     HPI: Melanie Jordan is a 28 y.o. female who is a patient of my colleague Wilfred Lacy and complains of facial pressure and feels like her "head is full". She notes worsening B/L tinnitus (chronic but now more intense). She endorses lightheadedness which is worse when seated or when going from standing to sitting. Symptoms x 1 mo, worse this week.  Lt ear with "shooting pains". No ear pressure, fullness. No decreased hearing.  No nasal congestion, runny nose. + PND, throat clearing cough. + fatigue. No fever, chills, myalgias. No CP, SOB.  Pt believes she may have allergies.  She took a dose of sudafed last night.  Pt notes a h/o anxiety and panic attacks. 2 recent panic attacks and pt was started on wellbutrin by PCP on 04/12/19.  Past Medical History:  Diagnosis Date  . ADHD   . GERD (gastroesophageal reflux disease)     No past surgical history on file.  Social History   Socioeconomic History  . Marital status: Single    Spouse name: Not on file  . Number of children: 0  . Years of education: Not on file  . Highest education level: Associate degree: occupational, Hotel manager, or vocational program  Occupational History  . Not on file  Social Needs  . Financial resource strain: Not on file  . Food insecurity:    Worry: Not on file    Inability: Not on file  . Transportation needs:    Medical: Not on file    Non-medical: Not on file  Tobacco Use  . Smoking status: Never Smoker  . Smokeless tobacco: Never Used  Substance and Sexual Activity  . Alcohol use: Yes    Comment: social  . Drug use: Never  . Sexual activity: Yes    Birth control/protection: None  Lifestyle  . Physical activity:    Days per week: Not on file    Minutes per session: Not on file  . Stress: Not on file   Relationships  . Social connections:    Talks on phone: Not on file    Gets together: Not on file    Attends religious service: Not on file    Active member of club or organization: Not on file    Attends meetings of clubs or organizations: Not on file    Relationship status: Not on file  . Intimate partner violence:    Fear of current or ex partner: Not on file    Emotionally abused: Not on file    Physically abused: Not on file    Forced sexual activity: Not on file  Other Topics Concern  . Not on file  Social History Narrative  . Not on file    Family History  Problem Relation Age of Onset  . Cancer Mother 30       cervical cancer  . Asthma Brother   . Arthritis Maternal Grandmother   . Depression Maternal Grandmother   . Heart disease Maternal Grandmother 63       CAD with stents  . Hyperlipidemia Maternal Grandmother   . Hypertension Maternal Grandmother   . Stroke Maternal Grandmother 72       TIA  . Cancer Paternal Grandmother 24       uterine cancer  .  Asthma Brother      Immunization History  Administered Date(s) Administered  . Hpv 05/26/2015  . Influenza,inj,Quad PF,6+ Mos 08/06/2018  . Tdap 05/26/2015    Outpatient Encounter Medications as of 04/15/2019  Medication Sig  . ALPRAZolam (XANAX) 0.25 MG tablet Take 1-2 tablets (0.25-0.5 mg total) by mouth at bedtime as needed for anxiety.  . norethindrone-ethinyl estradiol (JUNEL FE,GILDESS FE,LOESTRIN FE) 1-20 MG-MCG tablet Take 1 tablet by mouth daily.  Marland Kitchen buPROPion (WELLBUTRIN XL) 150 MG 24 hr tablet Take 1 tablet (150 mg total) by mouth daily. (Patient not taking: Reported on 04/12/2019)  . predniSONE (DELTASONE) 20 MG tablet 3 tabs po x 2 days, 2 tabs po x 2 days, 1 tab po x 2 days, 1/2 tab x 2 days   No facility-administered encounter medications on file as of 04/15/2019.      ROS: Pertinent positives and negatives noted in HPI. Remainder of ROS non-contributory    No Known Allergies  BP 110/78    Pulse 86   Temp 98.5 F (36.9 C) (Oral)   Ht 5\' 7"  (1.702 m)   Wt 156 lb (70.8 kg)   SpO2 99%   BMI 24.43 kg/m  BP Readings from Last 3 Encounters:  04/15/19 110/78  04/12/19 (!) 146/87  04/02/19 126/74   Pulse Readings from Last 3 Encounters:  04/15/19 86  04/12/19 94  04/02/19 93    Physical Exam  Constitutional: She is oriented to person, place, and time. She appears well-developed and well-nourished. No distress.  HENT:  Head: Normocephalic and atraumatic.  Right Ear: Tympanic membrane and ear canal normal. Tympanic membrane is not erythematous and not bulging. No middle ear effusion.  Left Ear: Ear canal normal. Tympanic membrane is not erythematous and not bulging. A middle ear effusion (moderate amount of serous fluid) is present.  Nose: Nose normal. Right sinus exhibits no maxillary sinus tenderness and no frontal sinus tenderness. Left sinus exhibits no maxillary sinus tenderness and no frontal sinus tenderness.  Mouth/Throat: Oropharynx is clear and moist and mucous membranes are normal.  Neck: Neck supple.  Cardiovascular: Normal rate, regular rhythm and normal heart sounds.  No murmur heard. Pulmonary/Chest: Effort normal and breath sounds normal. No respiratory distress. She has no wheezes. She has no rhonchi.  Lymphadenopathy:    She has no cervical adenopathy.  Neurological: She is alert and oriented to person, place, and time. She has normal strength. Coordination and gait normal.  Psychiatric:  Pt appears anxious      A/P:  1. Dizziness - Sedimentation rate - previously ordered by PCP  2. Hypokalemia - Sedimentation rate - previous ordered by PCP  3. Eustachian tube dysfunction, bilateral 4. Labyrinthitis of both ears - recommended nasal saline spray TID and PRN, flonase 2 sprays each nostril daily, zyrtec 1 tab daily Rx: - predniSONE (DELTASONE) 20 MG tablet; 3 tabs po x 2 days, 2 tabs po x 2 days, 1 tab po x 2 days, 1/2 tab x 2 days  Dispense:  13 tablet; Refill: 0 - work note given - pt had f/u with PCP already scheduled Discussed plan and reviewed medications with patient, including risks, benefits, and potential side effects. Pt expressed understand. All questions answered.

## 2019-04-15 NOTE — Patient Instructions (Addendum)
Nasal saline spray 3x/day flonase 2 sprays each nostril daily  claritin, zyretc or allegra 1 tab daily Prednisone as directed

## 2019-04-20 ENCOUNTER — Ambulatory Visit: Payer: 59 | Admitting: Nurse Practitioner

## 2019-04-21 ENCOUNTER — Encounter: Payer: Self-pay | Admitting: Nurse Practitioner

## 2019-04-21 NOTE — Telephone Encounter (Signed)
Recommend visit with Melanie Jordan tmrw to discuss.

## 2019-04-22 ENCOUNTER — Telehealth (INDEPENDENT_AMBULATORY_CARE_PROVIDER_SITE_OTHER): Payer: 59 | Admitting: Nurse Practitioner

## 2019-04-22 ENCOUNTER — Encounter: Payer: Self-pay | Admitting: Nurse Practitioner

## 2019-04-22 VITALS — BP 117/72 | HR 68 | Temp 98.0°F | Ht 67.0 in | Wt 156.0 lb

## 2019-04-22 DIAGNOSIS — F411 Generalized anxiety disorder: Secondary | ICD-10-CM

## 2019-04-22 DIAGNOSIS — R42 Dizziness and giddiness: Secondary | ICD-10-CM

## 2019-04-22 DIAGNOSIS — H6983 Other specified disorders of Eustachian tube, bilateral: Secondary | ICD-10-CM

## 2019-04-22 DIAGNOSIS — G44209 Tension-type headache, unspecified, not intractable: Secondary | ICD-10-CM

## 2019-04-22 MED ORDER — NAPROXEN 500 MG PO TABS: 500.0000 mg | ORAL_TABLET | Freq: Two times a day (BID) | ORAL | 0 refills | Status: DC | PRN

## 2019-04-22 MED ORDER — VENLAFAXINE HCL ER 37.5 MG PO CP24: 37.5000 mg | ORAL_CAPSULE | Freq: Every day | ORAL | 0 refills | Status: DC

## 2019-04-22 NOTE — Assessment & Plan Note (Addendum)
Chronic, waxing and waning, worse in last 57months with COVID-19 pandemic. She is hesitant about scheduling appt with psychologist " I do not like talking to people about these things". Expresses frustration with inability to safely go walking or hiking or shopping during this pandemic period. Concerned driving long distances may trigger dizzness. Symptoms are better when she is preoccupied at work. unable to tolerate prozac, buspar and wellbutrin (worsening anxiety, dizziness, insomnia, and uncontrollable crying). No longer taking adderall since worsening anxiety.(previous prescribed by Dr. Johnnye Sima with [facility]).  Hesitant about elavil due to possible sedating effects Will try Effexor. New Rx sent. Use alprazolam as needed. Refer to psychiatry if no improvement with effexor. F/up in 1-2weeks.

## 2019-04-22 NOTE — Progress Notes (Signed)
Virtual Visit via Video Note  I connected with Melanie Jordan on 04/22/19 at 10:45 AM EDT by a video enabled telemedicine application and verified that I am speaking with the correct person using two identifiers.  Location: Patient: Home Provider: Office   I discussed the limitations of evaluation and management by telemedicine and the availability of in person appointments. The patient expressed understanding and agreed to proceed.  CC: pt report hitting her head w/ x-ray machine on 05/24--had headache for 1 hr went away after took tylenol--yesterday experience headpressure near left side ear,back of the head,dizzy coming back, no other symtoms/   History of Present Illness: Head Injury   The incident occurred 3 to 5 days ago. The injury mechanism was a direct blow (hit left side of head against X-ray machine on Sunday 04/18/2019 ). There was no loss of consciousness. There was no blood loss. The quality of the pain is described as aching (pressure). The pain is mild. The pain has been constant since the injury. Associated symptoms include headaches. Pertinent negatives include no blurred vision, disorientation, memory loss, numbness, tinnitus, vomiting or weakness. She has tried nothing for the symptoms.  Anxiety  Presents for follow-up visit. Symptoms include depressed mood, dizziness, excessive worry, insomnia, muscle tension, nervous/anxious behavior, panic and restlessness. Patient reports no chest pain, compulsions, confusion, decreased concentration, dry mouth, feeling of choking, hyperventilation, impotence, irritability, malaise, nausea, obsessions, palpitations, shortness of breath or suicidal ideas. Symptoms occur most days. The severity of symptoms is interfering with daily activities. The quality of sleep is fair. Nighttime awakenings: one to two.   Compliance with medications is 0-25%. Treatment side effects: insomnia, uncontrollable crying.  worsening anxiety with Pandemic  period and implementation of social distancing. Expresses frustration with inability to safely go walking or hiking or shopping during this pandemic period. Concerned driving long distances may trigger dizzness. Symptoms are better when she is preoccupied at work. At home with fiance unable to tolerate prozac, buspar and wellbutrin (worsening anxiety, dizziness, insomnia, and uncontrollable crying). No longer taking adderall   Reviewed medications, problem list and family history.  Observations/Objective: Physical Exam  Constitutional: She is oriented to person, place, and time. No distress.  Eyes: Pupils are equal, round, and reactive to light. Conjunctivae and EOM are normal.  Neck: Normal range of motion. Neck supple.  Pulmonary/Chest: Effort normal.  Neurological: She is alert and oriented to person, place, and time. No cranial nerve deficit. She displays a negative Romberg sign.  Psychiatric: Her speech is normal and behavior is normal. Thought content normal. Her mood appears anxious. Cognition and memory are normal. She expresses no homicidal and no suicidal ideation. She expresses no suicidal plans and no homicidal plans.  Tearful during video call when talking about anxiety.   Assessment and Plan: Melanie Jordan was seen today for concussion.  Diagnoses and all orders for this visit:  GAD (generalized anxiety disorder) -     venlafaxine XR (EFFEXOR-XR) 37.5 MG 24 hr capsule; Take 1 capsule (37.5 mg total) by mouth daily with breakfast.  Eustachian tube dysfunction, bilateral -     Ambulatory referral to Physical Therapy  Dizziness -     Ambulatory referral to Physical Therapy  Acute non intractable tension-type headache -     naproxen (NAPROSYN) 500 MG tablet; Take 1 tablet (500 mg total) by mouth 2 (two) times daily between meals as needed (for pain, take with food).    Follow Up Instructions: Maintain upcoming appt. Change wellbutrin to effexor. Encourage  to schedule appt  with counselor. Use NSAIDs (ibuprofen 400mg  OTC or naproxen 500mg ) for headache (with food and 4-6hrs apart from oral prednisone). You will be contacted to schedule appt with PT. Start home exercise for vertigo.   I discussed the assessment and treatment plan with the patient. The patient was provided an opportunity to ask questions and all were answered. The patient agreed with the plan and demonstrated an understanding of the instructions.   The patient was advised to call back or seek an in-person evaluation if the symptoms worsen or if the condition fails to improve as anticipated.   Wilfred Lacy, NP

## 2019-04-22 NOTE — Patient Instructions (Addendum)
Advised to look up vertigo exercise (Brandt-Daroff) and start while waiting for appt with PT. Maintain adequate oral hydration and rest.. Maintain upcoming appt. Change wellbutrin to effexor. Encourage to schedule appt with counselor. Use NSAIDs (ibuprofen 400mg  OTC or naproxen 500mg ) for headache (with food and 4-6hrs apart from oral prednisone).

## 2019-04-23 ENCOUNTER — Encounter: Payer: Self-pay | Admitting: Nurse Practitioner

## 2019-04-30 ENCOUNTER — Ambulatory Visit: Payer: 59 | Admitting: Nurse Practitioner

## 2019-05-03 ENCOUNTER — Encounter: Payer: Self-pay | Admitting: Nurse Practitioner

## 2019-05-03 DIAGNOSIS — F4322 Adjustment disorder with anxiety: Secondary | ICD-10-CM

## 2019-05-03 DIAGNOSIS — F411 Generalized anxiety disorder: Secondary | ICD-10-CM

## 2019-05-11 ENCOUNTER — Encounter: Payer: Self-pay | Admitting: Nurse Practitioner

## 2019-05-11 DIAGNOSIS — R002 Palpitations: Secondary | ICD-10-CM

## 2019-05-19 ENCOUNTER — Telehealth (INDEPENDENT_AMBULATORY_CARE_PROVIDER_SITE_OTHER): Payer: 59 | Admitting: Nurse Practitioner

## 2019-05-19 ENCOUNTER — Encounter: Payer: Self-pay | Admitting: Nurse Practitioner

## 2019-05-19 ENCOUNTER — Other Ambulatory Visit: Payer: Self-pay

## 2019-05-19 VITALS — Ht 67.0 in

## 2019-05-19 DIAGNOSIS — R51 Headache: Secondary | ICD-10-CM | POA: Diagnosis not present

## 2019-05-19 DIAGNOSIS — R1013 Epigastric pain: Secondary | ICD-10-CM | POA: Diagnosis not present

## 2019-05-19 DIAGNOSIS — R42 Dizziness and giddiness: Secondary | ICD-10-CM

## 2019-05-19 DIAGNOSIS — R519 Headache, unspecified: Secondary | ICD-10-CM

## 2019-05-19 MED ORDER — OMEPRAZOLE 20 MG PO CPDR: 20.0000 mg | DELAYED_RELEASE_CAPSULE | Freq: Every day | ORAL | 0 refills | Status: DC

## 2019-05-19 NOTE — Patient Instructions (Addendum)
Start omeprazole. Call [facility] for appt, if no appt within next 2weeks, let me know to enter referral to [facility] will be contacted to schedule appt for CT head.   Sinus Headache  A sinus headache happens when your sinuses get swollen or blocked (clogged). Sinuses are spaces behind the bones of your face and forehead. You may feel pain or pressure in your face, forehead, ears, or upper teeth. Sinus headaches can be mild or very bad. Follow these instructions at home: General instructions  If told: ? Apply a warm, moist washcloth to your face. This can help to lessen pain. ? Use a nasal saline wash. Follow the directions on the bottle or box. Medicines   Take over-the-counter and prescription medicines only as told by your doctor.  If you were prescribed an antibiotic medicine, take it as told by your doctor. Do not stop taking it even if you start to feel better.  Use a nose spray if your nose feels full of mucus (congested). Hydrate and humidify  Drink enough water to keep your pee (urine) pale yellow.  Use a cool mist humidifier to keep the humidity level in your home above 50%.  Breathe in steam for 10-15 minutes, 3-4 times a day or as told by your doctor. You can do this in the bathroom while a hot shower is running.  Try not to spend time in cool or dry air. Contact a doctor if:  You get more than one headache a week.  Light or sound bothers you.  You have a fever.  You feel sick to your stomach (nauseous) or you throw up (vomit).  Your headaches do not get better with treatment. Get help right away if:  You have trouble seeing.  You suddenly have very bad pain in your face or head.  You start to have quick, sudden movements or shaking that you cannot control (seizure).  You are confused.  You have a stiff neck. Summary  A sinus headache happens when your sinuses get swollen or blocked (clogged). Sinuses are spaces behind the  bones of your face and forehead.  You may feel pain or pressure in your face, forehead, ears, or upper teeth.  Take over-the-counter and prescription medicines only as told by your doctor.  If told, apply a warm, moist washcloth to your face. This can help to lessen pain. This information is not intended to replace advice given to you by your health care provider. Make sure you discuss any questions you have with your health care provider. Document Released: 03/13/2011 Document Revised: 08/22/2017 Document Reviewed: 08/22/2017 Elsevier Interactive Patient Education  2019 [employer].

## 2019-05-19 NOTE — Progress Notes (Signed)
Virtual Visit via Video Note  I connected with Melanie Jordan on 05/19/19 at  3:30 PM EDT by a video enabled telemedicine application and verified that I am speaking with the correct person using two identifiers.  Location: Patient:home Provider: office   I discussed the limitations of evaluation and management by telemedicine and the availability of in person appointments. The patient expressed understanding and agreed to proceed.   CC: ABD pain, sinus pressure, Headache.  History of Present Illness: Headache  This is a recurrent problem. The current episode started more than 1 month ago. The problem occurs constantly. The problem has been unchanged. The pain is located in the frontal, retro-orbital, temporal and vertex region. The pain does not radiate. The pain quality is not similar to prior headaches. Quality: pressure. Associated symptoms include abdominal pain, dizziness and sinus pressure. Pertinent negatives include no abnormal behavior, anorexia, back pain, blurred vision, coughing, drainage, ear pain, eye pain, eye redness, eye watering, facial sweating, fever, hearing loss, insomnia, loss of balance, muscle aches, nausea, neck pain, numbness, phonophobia, photophobia, rhinorrhea, scalp tenderness, seizures, sore throat, swollen glands, tingling, tinnitus, visual change, vomiting, weakness or weight loss. The symptoms are aggravated by Valsalva (laying in supine position). She has tried NSAIDs and acetaminophen (flonase, zyrtec and oral prednisone) for the symptoms. The treatment provided no relief. There is no history of hypertension, migraine headaches, migraines in the family, obesity, recent head traumas, sinus disease or TMJ.  Abdominal Pain This is a new problem. The current episode started in the past 7 days. The onset quality is gradual. The problem occurs constantly. The problem has been unchanged. The pain is located in the epigastric region. The quality of the pain is a  sensation of fullness. The abdominal pain does not radiate. Associated symptoms include headaches. Pertinent negatives include no anorexia, arthralgias, belching, constipation, diarrhea, dysuria, fever, flatus, frequency, hematochezia, hematuria, melena, myalgias, nausea, vomiting or weight loss. Nothing aggravates the pain. The pain is relieved by nothing. She has tried antacids for the symptoms. The treatment provided no relief.   Observations/Objective: Physical Exam  Constitutional: No distress.  HENT:  Right Ear: External ear normal.  Left Ear: External ear normal.  Nose: Nose normal.  Eyes: Conjunctivae and EOM are normal.  Neck: Normal range of motion. Neck supple.  Cardiovascular: Normal rate.  Pulmonary/Chest: Effort normal.  Neurological: She is alert.   Assessment and Plan: Melanie Jordan was seen today for sinusitis, headache and abdominal pain.  Diagnoses and all orders for this visit:  Sinus headache -     CT MAXILLOFACIAL WO CONTRAST; Future  Dizziness -     CT MAXILLOFACIAL WO CONTRAST; Future  Epigastric pain -     omeprazole (PRILOSEC) 20 MG capsule; Take 1 capsule (20 mg total) by mouth daily.   Follow Up Instructions: See discharge instructions   I discussed the assessment and treatment plan with the patient. The patient was provided an opportunity to ask questions and all were answered. The patient agreed with the plan and demonstrated an understanding of the instructions.   The patient was advised to call back or seek an in-person evaluation if the symptoms worsen or if the condition fails to improve as anticipated.   Wilfred Lacy, NP

## 2019-05-20 NOTE — Addendum Note (Signed)
Addended by: Wilfred Lacy L on: 05/20/2019 11:43 AM   Modules accepted: Orders

## 2019-05-21 ENCOUNTER — Ambulatory Visit
Admission: RE | Admit: 2019-05-21 | Discharge: 2019-05-21 | Disposition: A | Discharge status: Home / Self Care | Payer: 59 | Source: Ambulatory Visit | Attending: Nurse Practitioner | Admitting: Nurse Practitioner

## 2019-05-21 ENCOUNTER — Other Ambulatory Visit: Payer: Self-pay

## 2019-05-21 ENCOUNTER — Telehealth: Payer: Self-pay | Admitting: Cardiology

## 2019-05-21 DIAGNOSIS — R42 Dizziness and giddiness: Secondary | ICD-10-CM | POA: Diagnosis not present

## 2019-05-21 DIAGNOSIS — R51 Headache: Secondary | ICD-10-CM | POA: Diagnosis not present

## 2019-05-21 DIAGNOSIS — R519 Headache, unspecified: Secondary | ICD-10-CM

## 2019-05-21 MED ORDER — BUTALBITAL-APAP-CAFFEINE 50-325-40 MG PO TABS: 1.0000 | ORAL_TABLET | Freq: Three times a day (TID) | ORAL | 0 refills | Status: DC | PRN

## 2019-05-21 NOTE — Telephone Encounter (Signed)
New Message     Called and left message to confirm appt and answer COVID questions

## 2019-05-21 NOTE — Telephone Encounter (Signed)

## 2019-05-24 ENCOUNTER — Ambulatory Visit (INDEPENDENT_AMBULATORY_CARE_PROVIDER_SITE_OTHER): Payer: 59 | Admitting: Cardiology

## 2019-05-24 ENCOUNTER — Other Ambulatory Visit: Payer: Self-pay

## 2019-05-24 ENCOUNTER — Encounter: Payer: Self-pay | Admitting: Cardiology

## 2019-05-24 VITALS — BP 137/81 | HR 77 | Ht 67.0 in | Wt 160.4 lb

## 2019-05-24 DIAGNOSIS — R002 Palpitations: Secondary | ICD-10-CM | POA: Insufficient documentation

## 2019-05-24 DIAGNOSIS — R42 Dizziness and giddiness: Secondary | ICD-10-CM | POA: Insufficient documentation

## 2019-05-24 NOTE — Patient Instructions (Addendum)
Medication Instructions:   NO CHANGES   Lab work: NOT NEEDED   Testing/Procedures: WILL BE SCHEDULE AT [facility] [facility]  WILL  WEAR MONITOR FOR 30 DAYS  FOR THE FIRST 2 WEEKS - TAKE ADDERALL AS USUAL THE LAST 2 WEEKS - DO NOT TAKE THE ADDERALL WHILE WEARING THE  EVENT MONITOR Your physician has recommended that you wear an event monitor. Event monitors are medical devices that record the heart's electrical activity. Doctors most often [place] these monitors to diagnose arrhythmias. Arrhythmias are problems with the speed or rhythm of the heartbeat. The monitor is a small, portable device. You can wear one while you do your normal daily activities. This is usually used to diagnose what is causing palpitations/syncope (passing out).    Follow-Up: At [facility], you and your health needs are our priority.  As part of our continuing mission to provide you with exceptional heart care, we have created designated Provider Care Teams.  These Care Teams include your primary Cardiologist (physician) and Advanced Practice Providers (APPs -  Physician Assistants and Nurse Practitioners) who all work together to provide you with the care you need, when you need it. . You will need a follow up appointment in 3 months.  Please call our office 2 months in advance to schedule this appointment.  You may see [name], MD or one of the following Advanced Practice Providers on your designated Care Team:   . Rosaria Ferries, PA-C . Jory Sims, DNP, ANP  Any Other Special Instructions Will Be Listed Below.  LIBERALIZE YOUR SALT INTAKE  KEEP HYDRATED

## 2019-05-24 NOTE — Progress Notes (Signed)
PCP: Flossie Buffy, NP  Clinic Note: Chief Complaint  Patient presents with  . New Patient (Initial Visit)    PCP / ER   . Palpitations    near syncope    HPI: Melanie Jordan is a 28 y.o. female who is being seen today for the evaluation of rapid HR / near syncope & chest tightness at the request of Nche, Charlene Brooke, NP.  Melanie Jordan was seen in the [facility] ER on 03/28/2019 with complaint of sharp L-sided CP, dizziness, rapid HR& diaphoresis. --> EKG & CXR were normal.  Troponin negative.  D-dimer negative.,  Was noted to have hypokalemia but otherwise thought to be due to muscle spasm. -->  After follow-up with PCP, was referred for cardiology evaluation.  Recent Hospitalizations:none  Studies Personally Reviewed - (if available, images/films reviewed: From Epic Chart or Care Everywhere)  none  Interval History: Melanie Jordan is a very pleasant young woman who recently moved to [place] with her fianc, and works as an Engineering geologist at [employer], but lives in [place].  She has been having several episodes lasting up to 2 or 3 hours over the last 2 to 3 months of sensations of rapid heart rate and dizziness sometimes associated with chest tightness.  She has a uneasy feeling feels sweaty and pale.  She has not actually passed out, but has felt like she would fall down if she did not sit down.  At least one time while wearing lead in the operating room environment, she felt as though she may very well pass out if she did not get out of the OR and take blood off.  Other than these episodes, she has not had to any major complaints.  She does note she tires quite easily. She does note that the symptoms are worse sometimes when she stands up they are worse when she is been up for a while.  She has been making a concerted effort to make sure that she drinks plenty of water, but has not necessarily been doing a good job with meant maintaining good solid diet.   She denies any fevers or chills or coughing.  For the most part she does not have any exertional chest pain or pressure.  No exertional dyspnea.  No PND, orthopnea or edema.  She does have a diagnosis of ADHD, and has been on Adderall for some time now, but has currently been holding it because of these episodes.  She does not necessarily notice any change, however.  She has not had any true syncopal symptoms, but has felt near syncope. No TIA/amaurosis fugax symptoms. No claudication.  ROS: A comprehensive was performed -pertinent details noted above. Review of Systems  Constitutional: Positive for diaphoresis (per HPI). Negative for chills, fever, malaise/fatigue and weight loss.  HENT: Negative for congestion and nosebleeds.   Respiratory: Negative.   Cardiovascular: Negative for claudication.  Gastrointestinal: Positive for nausea (Only on occasion if she has had a particular bad spell of dizziness). Negative for abdominal pain, blood in stool, constipation, heartburn and melena.  Genitourinary: Negative for hematuria.  Musculoskeletal: Negative for joint pain.  Skin: Negative.   Neurological: Positive for dizziness (per HPI) and tingling (A tingling is a sensation in her hands noted when she has these spells.). Negative for focal weakness and weakness.  Psychiatric/Behavioral: The patient is nervous/anxious (she has had panic attacks - but current Sx are not similar) and has insomnia.   All other systems reviewed  and are negative.  Issues with sinus HA  I have reviewed and (if needed) personally updated the patient's problem list, medications, allergies, past medical and surgical history, social and family history.   Past Medical History:  Diagnosis Date  . ADHD    now off Adderal   . GERD (gastroesophageal reflux disease)     History reviewed. No pertinent surgical history.  Current Meds  Medication Sig  . ALPRAZolam (XANAX) 0.25 MG tablet Take 1-2 tablets (0.25-0.5 mg  total) by mouth at bedtime as needed for anxiety.  . butalbital-acetaminophen-caffeine (FIORICET) 50-325-40 MG tablet Take 1-2 tablets by mouth every 8 (eight) hours as needed for headache.  . Cetirizine HCl (ZYRTEC PO) Take by mouth.  . norethindrone-ethinyl estradiol (JUNEL FE,GILDESS FE,LOESTRIN FE) 1-20 MG-MCG tablet Take 1 tablet by mouth daily.  Marland Kitchen omeprazole (PRILOSEC) 20 MG capsule Take 1 capsule (20 mg total) by mouth daily.    No Known Allergies  Social History   Tobacco Use  . Smoking status: Never Smoker  . Smokeless tobacco: Never Used  Substance Use Topics  . Alcohol use: Yes    Comment: social  . Drug use: Never   Social History   Social History Narrative   Engaged.      Started drinking more during the day - up to ~2 L (since onset of dizziness).      NO routine exercise.      Works in [employer] @ [employer]    family history includes Anxiety disorder in her brother and sister; Arthritis in her maternal grandmother; Asthma in her brother and brother; Cancer (age of onset: 33) in her mother; Cancer (age of onset: 68) in her paternal grandmother; Depression in her maternal grandmother; Drug abuse in her mother; Fibroids in her mother; GER disease in her mother; Heart disease (age of onset: 5) in her maternal grandmother; Hyperlipidemia in her maternal grandmother; Hypertension in her maternal grandmother; Osteopenia in her mother; Stroke (age of onset: 61) in her maternal grandmother; Thyroid disease in her mother.  Wt Readings from Last 3 Encounters:  05/24/19 160 lb 6.4 oz (72.8 kg)  04/22/19 156 lb (70.8 kg)  04/15/19 156 lb (70.8 kg)    PHYSICAL EXAM BP 137/81   Pulse 77   Ht 5\' 7"  (1.702 m)   Wt 160 lb 6.4 oz (72.8 kg)   BMI 25.12 kg/m   Orthostatics: non-significant Physical Exam  Constitutional: She is oriented to person, place, and time. She appears well-developed and well-nourished. No distress.  Healthy-appearing.  Pleasant young woman.  HENT:   Head: Normocephalic and atraumatic.  Mouth/Throat: Oropharynx is clear and moist.  Eyes: Pupils are equal, round, and reactive to light. Conjunctivae and EOM are normal.  Neck: Normal range of motion. Neck supple. No hepatojugular reflux and no JVD present. Carotid bruit is not present. No tracheal deviation present. No thyromegaly present.  Cardiovascular: Normal rate, regular rhythm, normal heart sounds and intact distal pulses.  No extrasystoles are present. PMI is not displaced. Exam reveals no gallop and no friction rub.  No murmur heard. Pulmonary/Chest: Effort normal and breath sounds normal. No respiratory distress. She has no wheezes. She has no rales.  Abdominal: Soft. Bowel sounds are normal. She exhibits no distension. There is no abdominal tenderness. There is no rebound and no guarding.  Musculoskeletal: Normal range of motion.        General: No edema.  Neurological: She is alert and oriented to person, place, and time. She has normal  reflexes. No cranial nerve deficit.  Skin: Skin is warm and dry. No rash noted. She is not diaphoretic. No erythema.  Psychiatric: She has a normal mood and affect. Her behavior is normal. Judgment and thought content normal.  Vitals reviewed.    Adult ECG Report  Rate: 77 ;  Rhythm: normal sinus rhythm and Normal axis, intervals and durations.;   Narrative Interpretation: Normal EKG   Other studies Reviewed: Additional studies/ records that were reviewed today include:  Recent Labs:   Lab Results  Component Value Date   CREATININE 0.73 04/12/2019   BUN 6 (L) 04/12/2019   NA 143 04/12/2019   K 3.9 04/12/2019   CL 109 04/12/2019   CO2 26 04/12/2019   Lab Results  Component Value Date   WBC 7.5 03/27/2019   HGB 12.8 03/27/2019   HCT 38.3 03/27/2019   MCV 91.4 03/27/2019   PLT 237 03/27/2019   Lab Results  Component Value Date   TSH 1.87 04/12/2019     ASSESSMENT / PLAN: Problem List Items Addressed This Visit    Rapid  palpitations    Rapid heart rate episodes would almost sound consistent with POTS, with the second active her resting heart rate is 77 which would mean this does not meet criteria.  She could also be having short runs of PAT or SVT, but more likely simply sinus tachycardia.  Plan:  30-day event monitor  Ensure adequate hydration, but also at liberalization of salt  Since she did not have positive orthostatics on check today, his symptoms persist, we may consider tilt table test.      Relevant Orders   EKG 12-Lead (Completed)   CARDIAC EVENT MONITOR   Dizziness on standing - Primary    Dizziness associated standing up with tachycardia.  I am checking a event monitor to ensure there is no arrhythmias.  Sounds very consistent with orthostatic symptoms.  We talked about adequate hydration and liberalization of salt intake.  We could consider Florinef or Midorine if symptoms persist.      Relevant Orders   EKG 12-Lead (Completed)   CARDIAC EVENT MONITOR     I spent a total of 9minutes with the patient and chart review. >  50% of the time was spent in direct patient consultation.   Current medicines are reviewed at length with the patient today.  (+/- concerns) none The following changes have been made:  none  Patient Instructions  Medication Instructions:   NO CHANGES   Lab work: NOT NEEDED   Testing/Procedures: WILL BE SCHEDULE AT [facility] 30 DAYS  FOR THE FIRST 2 WEEKS - TAKE ADDERALL AS USUAL THE LAST 2 WEEKS - DO NOT TAKE THE ADDERALL WHILE WEARING THE  EVENT MONITOR Your physician has recommended that you wear an event monitor. Event monitors are medical devices that record the heart's electrical activity. Doctors most often [place] these monitors to diagnose arrhythmias. Arrhythmias are problems with the speed or rhythm of the heartbeat. The monitor is a small, portable device. You can wear one while you do your normal daily  activities. This is usually used to diagnose what is causing palpitations/syncope (passing out).    Follow-Up: At [facility], you and your health needs are our priority.  As part of our continuing mission to provide you with exceptional heart care, we have created designated Provider Care Teams.  These Care Teams include your primary Cardiologist (physician) and Advanced  Practice Providers (APPs -  Physician Assistants and Nurse Practitioners) who all work together to provide you with the care you need, when you need it. . You will need a follow up appointment in 3 months.  Please call our office 2 months in advance to schedule this appointment.  You may see [name], MD or one of the following Advanced Practice Providers on your designated Care Team:   . Rosaria Ferries, PA-C . Jory Sims, DNP, ANP  Any Other Special Instructions Will Be Listed Below.  LIBERALIZE YOUR SALT INTAKE  KEEP HYDRATED     Studies Ordered:   Orders Placed This Encounter  Procedures  . CARDIAC EVENT MONITOR  . EKG 12-Lead      [name], M.D., M.S. Interventional Cardiologist   Pager # [phone] Phone # [phone] [address] [place], [place] [address]   Thank you for choosing Heartcare at [facility]!!

## 2019-05-25 ENCOUNTER — Telehealth: Payer: Self-pay | Admitting: *Deleted

## 2019-05-25 DIAGNOSIS — F4010 Social phobia, unspecified: Secondary | ICD-10-CM | POA: Diagnosis not present

## 2019-05-25 DIAGNOSIS — R42 Dizziness and giddiness: Secondary | ICD-10-CM | POA: Diagnosis not present

## 2019-05-25 DIAGNOSIS — F902 Attention-deficit hyperactivity disorder, combined type: Secondary | ICD-10-CM | POA: Diagnosis not present

## 2019-05-25 DIAGNOSIS — Z79899 Other long term (current) drug therapy: Secondary | ICD-10-CM | POA: Diagnosis not present

## 2019-05-25 NOTE — Telephone Encounter (Signed)
[employer] to ship a 30 day cardiac event monitor to the patients home.  Instructions reviewed briefly as they are included in the monitor kit. 

## 2019-05-26 ENCOUNTER — Encounter: Payer: Self-pay | Admitting: Cardiology

## 2019-05-26 NOTE — Assessment & Plan Note (Signed)
Dizziness associated standing up with tachycardia.  I am checking a event monitor to ensure there is no arrhythmias.  Sounds very consistent with orthostatic symptoms.  We talked about adequate hydration and liberalization of salt intake.  We could consider Florinef or Midorine if symptoms persist.

## 2019-05-26 NOTE — Assessment & Plan Note (Addendum)
Rapid heart rate episodes would almost sound consistent with POTS, with the second active her resting heart rate is 77 which would mean this does not meet criteria.  She could also be having short runs of PAT or SVT, but more likely simply sinus tachycardia.  Plan:  30-day event monitor -- 1st 2 weeks continue to hold Adderal & last 2 weeks restart  Ensure adequate hydration, but also at liberalization of salt  Since she did not have positive orthostatics on check today, his symptoms persist, we may consider tilt table test.

## 2019-05-29 ENCOUNTER — Encounter: Payer: Self-pay | Admitting: Nurse Practitioner

## 2019-05-31 ENCOUNTER — Other Ambulatory Visit: Payer: Self-pay

## 2019-05-31 ENCOUNTER — Ambulatory Visit: Payer: 59 | Attending: Nurse Practitioner | Admitting: Physical Therapy

## 2019-05-31 ENCOUNTER — Encounter: Payer: Self-pay | Admitting: Physical Therapy

## 2019-05-31 DIAGNOSIS — M62838 Other muscle spasm: Secondary | ICD-10-CM | POA: Diagnosis not present

## 2019-05-31 DIAGNOSIS — G44201 Tension-type headache, unspecified, intractable: Secondary | ICD-10-CM | POA: Diagnosis not present

## 2019-05-31 DIAGNOSIS — R42 Dizziness and giddiness: Secondary | ICD-10-CM | POA: Insufficient documentation

## 2019-05-31 NOTE — Therapy (Signed)
[facility] [place], [place], [address] Phone: [phone]   Fax:  [phone]  Physical Therapy Evaluation  Patient Details  Name: Melanie Jordan MRN: [id number] Date of Birth: 21-Apr-1991 Referring Provider (PT): Nche   Encounter Date: 05/31/2019  PT End of Session - 05/31/19 1159    Visit Number  1    Number of Visits  25    Date for PT Re-Evaluation  08/01/19    PT Start Time  1012    PT Stop Time  1053    PT Time Calculation (min)  41 min    Activity Tolerance  Patient tolerated treatment well    Behavior During Therapy  [facility] for tasks assessed/performed       Past Medical History:  Diagnosis Date  . ADHD    now off Adderal   . GERD (gastroesophageal reflux disease)     History reviewed. No pertinent surgical history.  There were no vitals filed for this visit.   Subjective Assessment - 05/31/19 1017    Subjective  Patient reports that she started having some dizziness at the beginning of May.  She is unsure of a specific cause.  She did have "fluid" in the ear, she took prednisone and was getting better, then at work she banged her head and the dizziness became worse again.    Diagnostic tests  CT was negative    Patient Stated Goals  have no dizziness    Currently in Pain?  Yes    Pain Score  3     Pain Location  Head    Pain Descriptors / Indicators  Pressure    Pain Type  Acute pain    Pain Radiating Towards  reports that the HA's have daily for about 2 months    Pain Onset  More than a month ago    Pain Frequency  Constant    Aggravating Factors   unsure    Pain Relieving Factors  ibuprofen    Effect of Pain on Daily Activities  limits some activities, reports dizziness limits the most         [facility] PT Assessment - 05/31/19 0001      Assessment   Medical Diagnosis  dizziness    Referring Provider (PT)  Nche    Onset Date/Surgical Date  03/31/19    Prior Therapy  no      Precautions   Precautions  None      Balance Screen   Has the patient fallen in the past 6 months  No    Has the patient had a decrease in activity level because of a fear of falling?   No    Is the patient reluctant to leave their home because of a fear of falling?   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    Leisure  no exercise      Posture/Postural Control   Posture Comments  fwd head, rounded shoulders      ROM / Strength   AROM / PROM / Strength  AROM;Strength      AROM   Overall AROM Comments  cervical ROM is WFL's does c/o nausea when turning head  side to side      Palpation   Palpation comment  her upper traps and neck are very tight and sore           Vestibular Assessment - 05/31/19 0001      Symptom Behavior   Subjective history of current problem  2 months unknown onset, prednisone helped but hit head and the dizziness returned    Type of Dizziness   Lightheadedness;Spinning    Frequency of Dizziness  at work all the time    Duration of Dizziness  can last all day with activity    Symptom Nature  Motion provoked    Relieving Factors  Lying supine    Progression of Symptoms  No change since onset      Oculomotor Exam   Comment  some c/o dizziness with head turns wiht focus      Vestibulo-Ocular Reflex   Comment  some dizziness with above testing      Positional Testing   Dix-Hallpike  Dix-Hallpike Right;Dix-Hallpike Left      Dix-Hallpike Right   Dix-Hallpike Right Symptoms  No nystagmus      Dix-Hallpike Left   Dix-Hallpike Left Symptoms  No nystagmus          Objective measurements completed on examination: See above findings.              PT Education - 05/31/19 1159    Education Details  gave HEP for VOR    Person(s) Educated  Patient     Methods  Explanation;Demonstration;Handout    Comprehension  Verbalized understanding       PT Short Term Goals - 05/31/19 1204      PT SHORT TERM GOAL #1   Title  indepednent with initial HEP    Time  2    Period  Weeks    Status  New        PT Long Term Goals - 05/31/19 1205      PT LONG TERM GOAL #1   Title  report HA decreased 50%    Time  8    Period  Weeks    Status  New      PT LONG TERM GOAL #2   Title  report dizziness decreased 50%    Time  8    Period  Weeks    Status  New      PT LONG TERM GOAL #3   Title  Patient will be able to tolerate work without having to call out    Time  8    Period  Weeks    Status  New      PT LONG TERM GOAL #4   Title  independent with advanced HEP    Time  8    Period  Weeks    Status  New             Plan - 05/31/19 1200    Clinical Impression Statement  Patient reports an onset of dizziness aobut 2 months ago, she is unsure of a cause, she reports that she was told she had fluid on her ear and was given prednisone, she reports that it was getting somewhat better and then one day at work she bumped her head an the dizziness returned.  She reports daily HA's, she is very tight and with spasms in the upper traps and the neck.  I tried Dix-Hallpike for each side and she had no symptoms, she reports that the  dizziness is with movements but no specific head movements, I gave VOR exercises for a low functioning VOR.    Stability/Clinical Decision Making  Evolving/Moderate complexity    Clinical Decision Making  Low    Rehab Potential  Good    PT Frequency  2x / week    PT Duration  8 weeks    PT Treatment/Interventions  ADLs/Self Care Home Management;Canalith Repostioning;Electrical Stimulation;Cryotherapy;Moist Heat;Traction;Ultrasound;Therapeutic activities;Therapeutic exercise;Neuromuscular re-education;Manual techniques;Dry needling;Patient/family education    PT Next Visit Plan  could retry Dix-Hallpike vs [place]  Roll, treat spasms    Consulted and Agree with Plan of Care  Patient       Patient will benefit from skilled therapeutic intervention in order to improve the following deficits and impairments:  Dizziness, Pain, Postural dysfunction, Decreased activity tolerance, Decreased range of motion  Visit Diagnosis: 1. Dizziness and giddiness   2. Acute intractable tension-type headache   3. Other muscle spasm        Problem List Patient Active Problem List   Diagnosis Date Noted  . Dizziness on standing 05/24/2019  . Rapid palpitations 05/24/2019  . Adjustment disorder with anxious mood 04/02/2019  . Initiation of oral contraception 09/21/2018    Sumner Boast., PT 05/31/2019, 12:07 PM  [place], [place], [address] Phone: [phone]   Fax:  [phone]  Name: Melanie Jordan MRN: [id number] Date of Birth: 30-Jun-1991

## 2019-06-01 ENCOUNTER — Ambulatory Visit (INDEPENDENT_AMBULATORY_CARE_PROVIDER_SITE_OTHER): Payer: 59

## 2019-06-01 ENCOUNTER — Ambulatory Visit (INDEPENDENT_AMBULATORY_CARE_PROVIDER_SITE_OTHER): Payer: 59 | Admitting: Licensed Clinical Social Worker

## 2019-06-01 ENCOUNTER — Encounter: Payer: Self-pay | Admitting: Nurse Practitioner

## 2019-06-01 ENCOUNTER — Encounter: Payer: Self-pay | Admitting: Physical Therapy

## 2019-06-01 DIAGNOSIS — F410 Panic disorder [episodic paroxysmal anxiety] without agoraphobia: Secondary | ICD-10-CM

## 2019-06-01 DIAGNOSIS — F064 Anxiety disorder due to known physiological condition: Secondary | ICD-10-CM

## 2019-06-01 DIAGNOSIS — R002 Palpitations: Secondary | ICD-10-CM

## 2019-06-01 DIAGNOSIS — R42 Dizziness and giddiness: Secondary | ICD-10-CM

## 2019-06-01 NOTE — Progress Notes (Signed)
Comprehensive Clinical Assessment (CCA) Note  06/01/2019 Melanie Jordan [id number]  Virtual Visit via Video Note  I connected with Melanie Jordan on 06/01/19 at  9:00 AM EDT by a video enabled telemedicine application and verified that I am speaking with the correct person using two identifiers.  I discussed the limitations of evaluation and management by telemedicine and the availability of in person appointments. The   I discussed the assessment and treatment plan with the patient. The patient was provided an opportunity to ask questions and all were answered. The patient agreed with the plan and demonstrated an understanding of the instructions.   The patient was advised to call back or seek an in-person evaluation if the symptoms worsen or if the condition fails to improve as anticipated.   Visit Diagnosis:      ICD-10-CM   1. Anxiety disorder due to general medical condition with panic attack  F06.4    F41.0       CCA Part One  Part One has been completed on paper by the patient.  (See scanned document in Chart Review)  CCA Part Two A  Intake/Chief Complaint:  CCA Intake With Chief Complaint CCA Part Two Date: 06/01/19 CCA Part Two Time: 0900 Chief Complaint/Presenting Problem: unknown health issues created anxiety Patients Currently Reported Symptoms/Problems: anxiety, tearfulness, snowballing thoughts about health led to panic (defines as overwhelming thoughts, body shaking, chest hurting) Collateral Involvement: fiance Individual's Strengths: intelligent, resilient Type of Services Patient Feels Are Needed: counseling, not interested in medication Initial Clinical Notes/Concerns: helps to calm anxiety by talking to fiance or lying down and breathing, no panic attacks in about 2 weeks   Patient Report: Patient states that she has always been a more nervous, cautious person. She shares that she has been experiencing physical symptoms for the past 2 months that her  doctor has not found a reason for, and has had increased anxiety around this. Patient describes worry thoughts about her health and what others will think about her inability to do the things she used to, since she does not look ill or have a name/explanation for what is happening. She indicates that when the symptoms first started around the time of the COVID 19 quarantine, she had a lot of anxiety around thoughts that she would get the virus and die because there was something wrong with her that they couldn't identify. This led to panic attacks. Patient is less anxious about that now, but still has panic attacks sometimes when thinking about or having a bad episode of her physical symptoms. At this time, patient states she has not had a panic attack in 2 weeks. Patient is not interested in medication for anxiety, though her PCP has prescribed her Xanax, PRN. She has a historical diagnosis of ADHD (description seems mostly inattentive though she states she was more hyperactive when very young) and has been prescribed Adderall for many years. However, she is not taking it now because it exacerbates her physical symptoms (dizziness, namely). She reports difficulty concentrating and maintaining focus when people are talking to her, but relates it more to the lack of Adderall than her anxiety.   Mental Health Symptoms Depression:  Depression: Change in energy/activity, Tearfulness(relates to medical/physical problems)  Mania:  Mania: N/A  Anxiety:   Anxiety: Tension, Sleep, Worrying, Difficulty concentrating, Restlessness  Psychosis:  Psychosis: N/A  Trauma:   N/A  Obsessions:  Obsessions: N/A  Compulsions:  Compulsions: N/A  Inattention:  Inattention: Avoids/dislikes activities that require  focus, Does not seem to listen, Forgetful, Symptoms before age 61, Loses things, Disorganized(cannot focus on people when they are talking to her)  Hyperactivity/Impulsivity:  Hyperactivity/Impulsivity: Feeling of  restlessness, Fidgets with hands/feet  Oppositional/Defiant Behaviors:  Oppositional/Defiant Behaviors: N/A  Borderline Personality:  Emotional Irregularity: N/A  Other Mood/Personality Symptoms:      Mental Status Exam Appearance and self-care  Stature:  Stature: Average  Weight:  Weight: Average weight  Clothing:  Clothing: Casual  Grooming:  Grooming: Well-groomed  Cosmetic use:  Cosmetic Use: Age appropriate  Posture/gait:  Posture/Gait: Normal  Motor activity:  Motor Activity: Restless  Sensorium  Attention:  Attention: Normal  Concentration:  Concentration: Anxiety interferes  Orientation:  Orientation: X5  Recall/memory:  Recall/Memory: Normal  Affect and Mood  Affect:  Affect: Anxious  Mood:  Mood: Euthymic  Relating  Eye contact:  Eye Contact: Normal  Facial expression:  Facial Expression: Responsive  Attitude toward examiner:  Attitude Toward Examiner: Cooperative  Thought and Language  Speech flow: Speech Flow: Normal  Thought content:  Thought Content: Appropriate to mood and circumstances  Preoccupation:     Hallucinations:     Organization:     Transport planner of Knowledge:  Fund of Knowledge: Average  Intelligence:  Intelligence: Average  Abstraction:  Abstraction: Normal  Judgement:  Judgement: Normal  Reality Testing:  Reality Testing: Realistic  Insight:  Insight: Fair  Decision Making:  Decision Making: Normal  Social Functioning  Social Maturity:  Social Maturity: Responsible  Social Judgement:  Social Judgement: Normal  Stress  Stressors:  Stressors: Illness  Coping Ability:  Coping Ability: English as a second language teacher Deficits:     Supports:      Family and Psychosocial History: Family history Marital status: Long term relationship Long term relationship, how long?: almost 12 years What types of issues is patient dealing with in the relationship?: no real problems, pretty supportive Are you sexually active?: Yes What is your sexual  orientation?: straight Does patient have children?: No  Childhood History:  Childhood History By whom was/is the patient raised?: Grandparents Additional childhood history information: went to live with grandmother when she was 4; "parents weren't the best" was removed from mother's care due to being left home alone around age 62; wanted to live with mother as a child, mother raised other siblings and they lived in the motel, mom went to jail, etc. now sees that living wth grandmother was best for her Description of patient's relationship with caregiver when they were a child: really good, saw parents but were never close Patient's description of current relationship with people who raised him/her: close with grandmother; has contact with parents but not close to them How were you disciplined when you got in trouble as a child/adolescent?: very little discipline, rarely got into trouble Does patient have siblings?: Yes Number of Siblings: 2 Description of patient's current relationship with siblings: did not grow up with them, they live in [place], texts with them Did patient suffer any verbal/emotional/physical/sexual abuse as a child?: No Did patient suffer from severe childhood neglect?: Yes Patient description of severe childhood neglect: left alone by mother Has patient ever been sexually abused/assaulted/raped as an adolescent or adult?: No Was the patient ever a victim of a crime or a disaster?: No Witnessed domestic violence?: No Has patient been effected by domestic violence as an adult?: No  CCA Part Two B  Employment/Work Situation: Employment / Work Situation Employment situation: Employed 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  How long has patient been employed?: 2 years Patient's job has been impacted by current illness: No Did You Receive Any Psychiatric Treatment/Services While in the [employer]?: No Are There Guns or Other Weapons in [place]?: Yes Types of  Guns/Weapons: fiance has shotgun; she does not know how to use it Are These Psychologist, educational?: Yes  Education: Education Last Grade Completed: 12 Did Teacher, adult education From [employer]?: Yes Did You Attend College?: Yes What Type of College Degree Do you Have?: associates in Radiology What Was Your Major?: Radiology Did You Have An Individualized Education Program (IIEP): No Did You Have Any Difficulty At School?: No  Religion: Religion/Spirituality Are You A Religious Person?: No  Leisure/Recreation: Leisure / Recreation Leisure and Hobbies: crocheting, working with finances, spending time with pets (cats)  Exercise/Diet: Exercise/Diet Do You Exercise?: No Have You Gained or Lost A Significant Amount of Weight in the Past Six Months?: No Do You Follow a Special Diet?: Yes Type of Diet: increased salt intake and increased hydration diet while being evaluated for POTS Do You Have Any Trouble Sleeping?: No(when physical symptoms began she had trouble sleeping, dr said it was adrenaline racing through her body and it calmed)  CCA Part Two C  Alcohol/Drug Use: Alcohol / Drug Use History of alcohol / drug use?: No history of alcohol / drug abuse(used to drink casually but it makes her symptoms worse so she stopped)  CCA Part Three  ASAM's:  Six Dimensions of Multidimensional Assessment  Dimension 1:  Acute Intoxication and/or Withdrawal Potential:     Dimension 2:  Biomedical Conditions and Complications:  Dimension 2:  Comments: physical symptoms that can't be explained, dizziness every day, heart racing, difficulty standing or exerting energy for extended periods of time  Dimension 3:  Emotional, Behavioral, or Cognitive Conditions and Complications:  Dimension 3:  Comments: anxiety related to unknown health conditions  Dimension 4:  Readiness to Change:     Dimension 5:  Relapse, Continued use, or Continued Problem Potential:     Dimension 6:  Recovery/Living Environment:       Substance use Disorder (SUD)    Social Function:  Social Functioning Social Maturity: Responsible Social Judgement: Normal  Stress:  Stress Stressors: Illness Coping Ability: Overwhelmed Patient Takes Medications The Way The Doctor Instructed?: Yes Priority Risk: Low Acuity  Risk Assessment- Self-Harm Potential: Risk Assessment For Self-Harm Potential Thoughts of Self-Harm: No current thoughts Method: No plan Availability of Means: No access/NA  Risk Assessment -Dangerous to Others Potential: Risk Assessment For Dangerous to Others Potential Method: No Plan Availability of Means: No access or NA Intent: Vague intent or NA Notification Required: No need or identified person  DSM5 Diagnoses: Patient Active Problem List   Diagnosis Date Noted  . Dizziness on standing 05/24/2019  . Rapid palpitations 05/24/2019  . Adjustment disorder with anxious mood 04/02/2019  . Initiation of oral contraception 09/21/2018    Patient Centered Plan: Patient is on the following Treatment Plan(s):  Anxiety  Recommendations for Services/Supports/Treatments:  Individual therapy  Treatment Plan Summary:  Enhance ability to handle effectively the full variety of life's anxieties  I provided 56 minutes of non-face-to-face time during this encounter.   Lillie Fragmin, LCSW

## 2019-06-07 ENCOUNTER — Other Ambulatory Visit (HOSPITAL_COMMUNITY): Payer: Self-pay | Admitting: Unknown Physician Specialty

## 2019-06-07 ENCOUNTER — Ambulatory Visit: Payer: 59 | Admitting: Physical Therapy

## 2019-06-07 ENCOUNTER — Encounter: Payer: Self-pay | Admitting: Physical Therapy

## 2019-06-07 ENCOUNTER — Other Ambulatory Visit: Payer: Self-pay

## 2019-06-07 ENCOUNTER — Other Ambulatory Visit: Payer: Self-pay | Admitting: Unknown Physician Specialty

## 2019-06-07 DIAGNOSIS — R42 Dizziness and giddiness: Secondary | ICD-10-CM

## 2019-06-07 DIAGNOSIS — R51 Headache: Secondary | ICD-10-CM | POA: Diagnosis not present

## 2019-06-07 DIAGNOSIS — G44201 Tension-type headache, unspecified, intractable: Secondary | ICD-10-CM | POA: Diagnosis not present

## 2019-06-07 DIAGNOSIS — J301 Allergic rhinitis due to pollen: Secondary | ICD-10-CM | POA: Diagnosis not present

## 2019-06-07 DIAGNOSIS — M62838 Other muscle spasm: Secondary | ICD-10-CM

## 2019-06-07 DIAGNOSIS — R519 Headache, unspecified: Secondary | ICD-10-CM

## 2019-06-07 DIAGNOSIS — R070 Pain in throat: Secondary | ICD-10-CM | POA: Diagnosis not present

## 2019-06-07 DIAGNOSIS — J309 Allergic rhinitis, unspecified: Secondary | ICD-10-CM | POA: Diagnosis not present

## 2019-06-07 NOTE — Therapy (Signed)
[facility] [place], [place], [address] Phone: [phone]   Fax:  [phone]  Physical Therapy Treatment  Patient Details  Name: Melanie Jordan MRN: [id number] Date of Birth: 16-Aug-1991 Referring Provider (PT): Nche   Encounter Date: 06/07/2019  PT End of Session - 06/07/19 1348    Visit Number  2    Number of Visits  25    Date for PT Re-Evaluation  08/01/19    PT Start Time  1312    PT Stop Time  4917    PT Time Calculation (min)  35 min    Activity Tolerance  Patient tolerated treatment well    Behavior During Therapy  [facility] for tasks assessed/performed       Past Medical History:  Diagnosis Date  . ADHD    now off Adderal   . GERD (gastroesophageal reflux disease)     History reviewed. No pertinent surgical history.  There were no vitals filed for this visit.  Subjective Assessment - 06/07/19 1313    Subjective  Patient reports that some of the exercises cause the dizziness especially the saccades and the tracking type exercises, she does report a HA now.    Currently in Pain?  Yes    Pain Score  3     Pain Location  Head                       [facility] Adult PT Treatment/Exercise - 06/07/19 0001      Manual Therapy   Manual Therapy  Neural Stretch;Soft tissue mobilization;Manual Traction    Manual therapy comments  used combo on the Upper trap     Soft tissue mobilization  to the right upper trap and into the right rhomboid    Manual Traction  occipital release    Neural Stretch  upper trap and levator stretch             PT Education - 06/07/19 1349    Education Details  gave HEP for scapular retraction, shoulder shrugs and upper trap and levator stretches    Person(s) Educated  Patient    Methods  Explanation;Demonstration;Verbal cues;Tactile cues;Handout    Comprehension  Verbalized understanding       PT Short Term Goals - 06/07/19 1351      PT SHORT  TERM GOAL #1   Title  indepednent with initial HEP    Status  Partially Met        PT Long Term Goals - 05/31/19 1205      PT LONG TERM GOAL #1   Title  report HA decreased 50%    Time  8    Period  Weeks    Status  New      PT LONG TERM GOAL #2   Title  report dizziness decreased 50%    Time  8    Period  Weeks    Status  New      PT LONG TERM GOAL #3   Title  Patient will be able to tolerate work without having to call out    Time  8    Period  Weeks    Status  New      PT LONG TERM GOAL #4   Title  independent with advanced HEP    Time  8    Period  Weeks    Status  New  Plan - 06/07/19 1350    Clinical Impression Statement  Patient with reports that she had some dizziness at [place] this weekend, reports some dizziness with saccades and tracking exercises.  She is extremely tight with large knot in the right upper trap    PT Next Visit Plan  she is to see ENT will await that result before seeing again    Consulted and Agree with Plan of Care  Patient       Patient will benefit from skilled therapeutic intervention in order to improve the following deficits and impairments:  Dizziness, Pain, Postural dysfunction, Decreased activity tolerance, Decreased range of motion  Visit Diagnosis: 1. Dizziness and giddiness   2. Acute intractable tension-type headache   3. Other muscle spasm        Problem List Patient Active Problem List   Diagnosis Date Noted  . Dizziness on standing 05/24/2019  . Rapid palpitations 05/24/2019  . Adjustment disorder with anxious mood 04/02/2019  . Initiation of oral contraception 09/21/2018    Sumner Boast., PT 06/07/2019, 1:52 PM  [place], [place], [address] Phone: [phone]   Fax:  [phone]  Name: Melanie Jordan MRN: [id number] Date of Birth: 1991-05-06

## 2019-06-11 ENCOUNTER — Encounter: Payer: Self-pay | Admitting: Nurse Practitioner

## 2019-06-11 ENCOUNTER — Telehealth (INDEPENDENT_AMBULATORY_CARE_PROVIDER_SITE_OTHER): Payer: 59 | Admitting: Nurse Practitioner

## 2019-06-11 ENCOUNTER — Other Ambulatory Visit: Payer: Self-pay

## 2019-06-11 VITALS — BP 122/73 | HR 94 | Temp 98.4°F | Ht 67.0 in | Wt 151.5 lb

## 2019-06-11 DIAGNOSIS — M79605 Pain in left leg: Secondary | ICD-10-CM

## 2019-06-11 DIAGNOSIS — R6889 Other general symptoms and signs: Secondary | ICD-10-CM

## 2019-06-11 NOTE — Progress Notes (Signed)
Virtual Visit via Video Note  I connected with Melanie Jordan on 06/14/19 at  3:00 PM EDT by a video enabled telemedicine application and verified that I am speaking with the correct person using two identifiers.  Location: Patient: Home Provider: Office   I discussed the limitations of evaluation and management by telemedicine and the availability of in person appointments. The patient expressed understanding and agreed to proceed.  History of Present Illness: Leg Pain  The incident occurred 6 to 12 hours ago. The incident occurred at home. There was no injury mechanism. The pain is present in the left leg. Quality: numbness and tinglin. The pain has been intermittent since onset. Pertinent negatives include no inability to bear weight, loss of motion, loss of sensation, muscle weakness, numbness or tingling. Nothing aggravates the symptoms. She has tried nothing for the symptoms.   Observations/Objective: Physical Exam  Constitutional: She is oriented to person, place, and time. No distress.  Cardiovascular: Normal rate.  Respiratory: Effort normal.  Musculoskeletal:        General: No edema.     Right knee: She exhibits normal range of motion, no swelling and no effusion.     Left knee: She exhibits normal range of motion, no swelling, no effusion and no erythema.     Right ankle: She exhibits normal range of motion and no swelling.     Left ankle: She exhibits normal range of motion and no swelling.     Right upper leg: She exhibits no tenderness, no swelling and no edema.     Left upper leg: She exhibits no tenderness, no swelling and no edema.     Right lower leg: She exhibits no tenderness and no swelling. No edema.     Left lower leg: She exhibits no tenderness and no swelling. No edema.     Right foot: Normal range of motion. No tenderness or swelling.     Left foot: Normal range of motion. No tenderness or swelling.  Neurological: She is alert and oriented to person,  place, and time.  Skin: Skin is warm and dry. No rash noted. No erythema.  Psychiatric: She has a normal mood and affect. Her behavior is normal.   Assessment and Plan: Melanie Jordan was seen today for leg pain.  Diagnoses and all orders for this visit:  Somatic complaints, multiple   Follow Up Instructions: Use tylenol or ibuprofen as needed. Monitor symptoms at this time. Call office if no improvement in 1week.   I discussed the assessment and treatment plan with the patient. The patient was provided an opportunity to ask questions and all were answered. The patient agreed with the plan and demonstrated an understanding of the instructions.   The patient was advised to call back or seek an in-person evaluation if the symptoms worsen or if the condition fails to improve as anticipated.  Wilfred Lacy, NP

## 2019-06-11 NOTE — Telephone Encounter (Signed)
Contact patient and schedule virtual visit with another provider since my schedule is full

## 2019-06-12 ENCOUNTER — Other Ambulatory Visit: Payer: Self-pay | Admitting: Nurse Practitioner

## 2019-06-12 DIAGNOSIS — R1013 Epigastric pain: Secondary | ICD-10-CM

## 2019-06-14 ENCOUNTER — Other Ambulatory Visit: Payer: Self-pay

## 2019-06-14 ENCOUNTER — Encounter: Payer: Self-pay | Admitting: Nurse Practitioner

## 2019-06-14 ENCOUNTER — Ambulatory Visit (HOSPITAL_COMMUNITY): Payer: 59 | Admitting: Licensed Clinical Social Worker

## 2019-06-14 DIAGNOSIS — F064 Anxiety disorder due to known physiological condition: Secondary | ICD-10-CM

## 2019-06-14 DIAGNOSIS — F410 Panic disorder [episodic paroxysmal anxiety] without agoraphobia: Secondary | ICD-10-CM

## 2019-06-14 NOTE — Progress Notes (Signed)
Patient ID: Melanie Jordan, female   DOB: 01-14-1991, 28 y.o.   MRN: [id number]  Virtual Visit via Video Note  I connected with Melanie Jordan on 06/14/19 at  2:00 PM EDT by a video enabled telemedicine application and verified that I am speaking with the correct person using two identifiers.  I discussed the limitations of evaluation and management by telemedicine and the availability of in person appointments. The patient expressed understanding and agreed to proceed.  I discussed the assessment and treatment plan with the patient. The patient was provided an opportunity to ask questions and all were answered. The patient agreed with the plan and demonstrated an understanding of the instructions.   The patient was advised to call back or seek an in-person evaluation if the symptoms worsen or if the condition fails to improve as anticipated.  Type of Therapy: Individual Therapy  Treatment Goals addressed:    Interventions: ACT, Supportive Counseling  Summary: Melanie Jordan  is a 28 y.o. female who presents with symptoms of anxiety, and panic attacks  Therapist Response:  Patient met with clinician for an individual session. Patient reports multiple physical concerns over the past week or so. Although she has not had any more panic attacks, she has had trouble with breathing and sore throat, with the feeling that her throat is swelling. Counselor guided patient to share how she is coping with not knowing what is going on. Patient shares that she spends a lot of time on the internet looking up her symptoms. Counselor explored with her the impact that googling symptoms has on her anxiety. Counselor facilitated functional analysis of this behavior and patient identified that looking up symptoms makes her feel more anxious, but when she tries to not look them up she feels anxious also, and cannot keep herself from her phone. Counselor normalized feeling the need to do something, and encouraged her  to write about her feelings and symptoms rather than looking them up. Patient stated that she doesn't remember what normal looks like for her anymore Counselor guided patient to identify what her life looked like before her symptoms began, and patient struggled to do so. Counselor challenged patient to identify who [place] is outside of the anxiety, but patient stated she didn't know. Patient and counselor explored the role that anxiety plays in her life, and counselor emphasized that she is more than anxiety. Counselor encouraged her to externalize her anxiety to find room for herself again. Counselor guided patient to identify the best thing that has happened to her lately, and pointed out that even in this patient is very focused on her physical symptoms. Counselor contrasted this with asking what fiance would say to this question. Patient recognized that fiance would be more focused on outside things, like his brother coming to visit over July 4th.   Suicidal/Homicidal:  No  Recommendation and plan:  Continued sessions each week  Diagnosis:  Anxiety Disorder due to General Medical Condition  I provided 57 minutes of non-face-to-face time during this encounter.   Lillie Fragmin, LCSW

## 2019-06-15 ENCOUNTER — Encounter: Payer: Self-pay | Admitting: Nurse Practitioner

## 2019-06-15 ENCOUNTER — Ambulatory Visit (INDEPENDENT_AMBULATORY_CARE_PROVIDER_SITE_OTHER): Payer: 59 | Admitting: Nurse Practitioner

## 2019-06-15 VITALS — BP 118/84 | HR 91 | Temp 97.9°F | Ht 67.0 in | Wt 152.8 lb

## 2019-06-15 DIAGNOSIS — R1084 Generalized abdominal pain: Secondary | ICD-10-CM

## 2019-06-15 DIAGNOSIS — R1013 Epigastric pain: Secondary | ICD-10-CM

## 2019-06-15 DIAGNOSIS — M791 Myalgia, unspecified site: Secondary | ICD-10-CM

## 2019-06-15 MED ORDER — METHOCARBAMOL 500 MG PO TABS: 500.0000 mg | ORAL_TABLET | Freq: Three times a day (TID) | ORAL | 0 refills | Status: DC | PRN

## 2019-06-15 NOTE — Progress Notes (Signed)
Subjective:  Patient ID: Melanie Jordan, female    DOB: August 23, 1991  Age: 28 y.o. MRN: [id number]  CC: Pain (pt is c/o left leg pain and sometimes thigh feel like swelling,right side neck pain/5 days/)  Muscle Pain This is a recurrent problem. The current episode started more than 1 month ago (onset 53months ago). The problem occurs constantly. The problem has been gradually worsening since onset. The pain occurs in the context of recent emotional stress. The pain is present in the neck, right shoulder, left knee and left lower leg. The symptoms are aggravated by any movement. Associated symptoms include abdominal pain and fatigue. Pertinent negatives include no diarrhea, dysuria, fever, joint swelling, nausea, stiffness, sensory change, swollen glands, vomiting or weakness. Past treatments include nothing. There is no swelling present.  Abdominal Pain This is a recurrent problem. The current episode started more than 1 month ago. The onset quality is gradual. The problem occurs constantly. The problem has been waxing and waning. The pain is located in the generalized abdominal region. The quality of the pain is dull, a sensation of fullness and colicky. The abdominal pain does not radiate. Associated symptoms include anorexia and myalgias. Pertinent negatives include no diarrhea, dysuria, fever, melena, nausea or vomiting. The pain is aggravated by eating. The pain is relieved by nothing. She has tried proton pump inhibitors for the symptoms. The treatment provided no relief.   Reviewed past Medical, Social and Family history today.  Outpatient Medications Prior to Visit  Medication Sig Dispense Refill  . ADDERALL XR 30 MG 24 hr capsule Take by mouth daily.    Marland Kitchen ALPRAZolam (XANAX) 0.25 MG tablet Take 1-2 tablets (0.25-0.5 mg total) by mouth at bedtime as needed for anxiety. 14 tablet 0  . Cetirizine HCl (ZYRTEC PO) Take by mouth.    . norethindrone-ethinyl estradiol (JUNEL FE,GILDESS FE,LOESTRIN  FE) 1-20 MG-MCG tablet Take 1 tablet by mouth daily. 3 Package 3  . omeprazole (PRILOSEC) 20 MG capsule TAKE 1 CAPSULE BY MOUTH EVERY DAY 30 capsule 0  . butalbital-acetaminophen-caffeine (FIORICET) 50-325-40 MG tablet Take 1-2 tablets by mouth every 8 (eight) hours as needed for headache. (Patient not taking: Reported on 06/11/2019) 20 tablet 0   No facility-administered medications prior to visit.    ROS See HPI  Objective:  BP 118/84   Pulse 91   Temp 97.9 F (36.6 C) (Oral)   Ht 5\' 7"  (1.702 m)   Wt 152 lb 12.8 oz (69.3 kg)   LMP 06/10/2019   SpO2 100%   BMI 23.93 kg/m   BP Readings from Last 3 Encounters:  06/15/19 118/84  06/11/19 122/73  05/24/19 137/81    Wt Readings from Last 3 Encounters:  06/15/19 152 lb 12.8 oz (69.3 kg)  06/11/19 151 lb 8 oz (68.7 kg)  05/24/19 160 lb 6.4 oz (72.8 kg)    Physical Exam Vitals signs reviewed.  Constitutional:      General: She is not in acute distress. Eyes:     Extraocular Movements: Extraocular movements intact.     Conjunctiva/sclera: Conjunctivae normal.  Neck:     Musculoskeletal: Normal range of motion and neck supple.  Cardiovascular:     Rate and Rhythm: Normal rate.  Pulmonary:     Effort: Pulmonary effort is normal.  Musculoskeletal: Normal range of motion.        General: No swelling.     Right shoulder: Normal.     Right elbow: Normal.    Right wrist: Normal.  Right hip: Normal.     Left hip: Normal.     Right knee: She exhibits normal range of motion, no swelling, no effusion and normal patellar mobility. Tenderness found. Medial joint line tenderness noted. No lateral joint line and no patellar tendon tenderness noted.     Left knee: She exhibits normal range of motion, no swelling, no effusion, no erythema and normal patellar mobility. Tenderness found. Medial joint line tenderness noted. No lateral joint line and no patellar tendon tenderness noted.     Right ankle: Normal.     Left ankle: Normal.      Cervical back: She exhibits tenderness. She exhibits normal range of motion, no bony tenderness, no pain and normal pulse.     Lumbar back: She exhibits tenderness. She exhibits normal range of motion, no bony tenderness, no pain and normal pulse.     Right upper arm: Normal.     Right forearm: Normal.     Right hand: Normal.     Right upper leg: Normal.     Left upper leg: Normal.     Right lower leg: Normal. No edema.     Left lower leg: Normal. No edema.  Lymphadenopathy:     Cervical: No cervical adenopathy.  Skin:    Findings: No erythema.  Neurological:     Mental Status: She is oriented to person, place, and time.  Psychiatric:        Attention and Perception: Attention normal.        Mood and Affect: Affect is flat.        Speech: Speech normal.        Behavior: Behavior is cooperative.    Lab Results  Component Value Date   WBC 7.5 03/27/2019   HGB 12.8 03/27/2019   HCT 38.3 03/27/2019   PLT 237 03/27/2019   GLUCOSE 107 (H) 04/12/2019   CHOL 125 09/21/2018   TRIG 60.0 09/21/2018   HDL 47.30 09/21/2018   LDLCALC 65 09/21/2018   ALT 12 09/21/2018   AST 15 09/21/2018   NA 143 04/12/2019   K 3.9 04/12/2019   CL 109 04/12/2019   CREATININE 0.73 04/12/2019   BUN 6 (L) 04/12/2019   CO2 26 04/12/2019   TSH 1.87 04/12/2019   Ct Maxillofacial Wo Contrast  Result Date: 05/21/2019 CLINICAL DATA:  Sinus headaches x2 months EXAM: CT MAXILLOFACIAL WITHOUT CONTRAST TECHNIQUE: Multidetector CT imaging of the maxillofacial structures was performed. Multiplanar CT image reconstructions were also generated. COMPARISON:  None. FINDINGS: Osseous: No fracture or mandibular dislocation. No destructive process. Orbits: Negative. No traumatic or inflammatory finding. Sinuses: Clear. Soft tissues: Negative. Limited intracranial: No significant or unexpected finding. IMPRESSION: Negative Electronically Signed   By: Lucrezia Europe M.D.   On: 05/21/2019 11:29    Assessment & Plan:    Melanie Jordan was seen today for pain.  Diagnoses and all orders for this visit:  Myalgia -     Antinuclear Antib (ANA) -     methocarbamol (ROBAXIN) 500 MG tablet; Take 1 tablet (500 mg total) by mouth every 8 (eight) hours as needed for muscle spasms. -     B. burgdorfi antibodies  Dyspepsia -     Lipase -     Hepatic function panel -     US Abdomen Limited RUQ; Future -     Cancel: Helicobacter pylori special antigen; Future -     Cancel: Helicobacter pylori special antigen; Future -     Cancel: Helicobacter pylori special  antigen -     Helicobacter pylori special antigen; Future  Generalized abdominal pain -     Lipase -     Hepatic function panel -     US Abdomen Limited RUQ; Future -     Cancel: Helicobacter pylori special antigen; Future -     Cancel: Helicobacter pylori special antigen; Future -     Cancel: Helicobacter pylori special antigen -     Helicobacter pylori special antigen; Future   I have discontinued Chesney A. Runquist's butalbital-acetaminophen-caffeine. I am also having her start on methocarbamol. Additionally, I am having her maintain her norethindrone-ethinyl estradiol, ALPRAZolam, Cetirizine HCl (ZYRTEC PO), Adderall XR, and omeprazole.  Meds ordered this encounter  Medications  . methocarbamol (ROBAXIN) 500 MG tablet    Sig: Take 1 tablet (500 mg total) by mouth every 8 (eight) hours as needed for muscle spasms.    Dispense:  30 tablet    Refill:  0    Order Specific Question:   Supervising Provider    Answer:   Lucille Passy [[id number]]    Problem List Items Addressed This Visit    None    Visit Diagnoses    Myalgia    -  Primary   Relevant Medications   methocarbamol (ROBAXIN) 500 MG tablet   Other Relevant Orders   Antinuclear Antib (ANA)   B. burgdorfi antibodies   Dyspepsia       Relevant Orders   Lipase   Hepatic function panel   US Abdomen Limited RUQ   Helicobacter pylori special antigen   Generalized abdominal pain       Relevant  Orders   Lipase   Hepatic function panel   US Abdomen Limited RUQ   Helicobacter pylori special antigen      Follow-up: Return if symptoms worsen or fail to improve.  Wilfred Lacy, NP

## 2019-06-15 NOTE — Patient Instructions (Addendum)
Continue tylenol or ibuprofen or naproxen as needed for pain. Do leg and neck exercise once a day.  Go to lab for blood draw and stool collection kit. You will be contacted to schedule appt for ABD [place].  Stop omeprazole x 2weeks, then return stool sample to lab  Quadriceps Strain Rehab Ask your health care provider which exercises are safe for you. Do exercises exactly as told by your health care provider and adjust them as directed. It is normal to feel mild stretching, pulling, tightness, or discomfort as you do these exercises. Stop right away if you feel sudden pain or your pain gets worse. Do not begin these exercises until told by your health care provider. Stretching and range-of-motion exercises These exercises warm up your muscles and joints and improve the movement and flexibility of your thigh. These exercises can also help to relieve stiffness or swelling. Heel slides  1. Lie on your back with both legs straight. If this causes back discomfort, bend the knee of your healthy leg so your foot is flat on the floor. 2. Slowly slide your left / right heel back toward your buttocks. Stop when you feel a gentle stretch in the front of your knee or thigh (quadriceps). 3. Hold this position for __________ seconds. 4. Slowly slide your left / right heel back to the starting position. Repeat __________ times. Complete this exercise __________ times a day. Quadriceps stretch, prone  1. Lie on your abdomen on a firm surface, such as a bed or padded floor (prone position). 2. Bend your left / right knee and hold your ankle. If you cannot reach your ankle or pant leg, loop a belt around your foot and grab the belt instead. 3. Gently pull your heel toward your buttocks. Your knee should not slide out to the side. You should feel a stretch in the front of your thigh and knee (quadriceps). 4. Hold this position for __________ seconds. Repeat __________ times. Complete this exercise __________ times  a day. Strengthening exercises These exercises build strength and endurance in your thigh. Endurance is the ability to use your muscles for a long time, even after your muscles get tired. Straight leg raises, supine This exercise stretches the muscles in front of your thigh (quadriceps) and the muscles that move your hips (hip flexors). Quality counts! Watch for signs that the quadriceps muscle is working to ensure that you are strengthening the correct muscles and not cheating by using healthier muscles. 1. Lie on your back (supine position) with your left / right leg extended and your other knee bent. 2. Tense the muscles in the front of your left / right thigh. You should see your kneecap slide up or see increased dimpling just above the knee. 3. Tighten these muscles even more and raise your leg 4-6 inches (10-15 cm) off the floor. 4. Hold this position for __________ seconds. 5. Keep the thigh muscles tense as you lower your leg. 6. Relax the muscles slowly and completely after each repetition. Repeat __________ times. Complete this exercise __________ times a day. Leg raises, prone This exercise strengthens the muscles that move the hips (hip extensors). 1. Lie on your abdomen on a bed or a firm surface (prone position). Place a pillow under your hips. 2. Bend your left / right knee so your foot is straight up in the air. 3. Squeeze your buttocks muscles and lift your left / right thigh off the bed. Do not let your back arch. 4. Hold this  position for __________ seconds. 5. Slowly return to the starting position. Let your muscles relax completely before doing another repetition. Repeat __________ times. Complete this exercise __________ times a day. Wall sits Follow the directions for form closely. Knee pain can occur if your feet or knees are not placed properly. 1. Lean your back against a smooth wall or door, and walk your feet out 18-24 inches (46-61 cm) from it. 2. Place your feet  hip-width apart. 3. Slowly slide down the wall or door until your knees bend __________ degrees. Keep your weight back and over your heels, not over your toes. Keep your thighs straight or pointing slightly outward. 4. Hold this position for __________ seconds. 5. Use your thigh and buttocks muscles to push yourself back up to a standing position. Keep your weight through your heels while you do this. 6. Rest for __________ seconds after each repetition. Repeat __________ times. Complete this exercise __________ times a day. This information is not intended to replace advice given to you by your health care provider. Make sure you discuss any questions you have with your health care provider. Document Released: 11/11/2005 Document Revised: 03/05/2019 Document Reviewed: 09/03/2018 Elsevier Patient Education  2020 [place].   Neck Exercises Ask your health care provider which exercises are safe for you. Do exercises exactly as told by your health care provider and adjust them as directed. It is normal to feel mild stretching, pulling, tightness, or discomfort as you do these exercises. Stop right away if you feel sudden pain or your pain gets worse. Do not begin these exercises until told by your health care provider. Neck exercises can be important for many reasons. They can improve strength and maintain flexibility in your neck, which will help your upper back and prevent neck pain. Stretching exercises Rotation neck stretching  1. Sit in a chair or stand up. 2. Place your feet flat on the floor, shoulder width apart. 3. Slowly turn your head (rotate) to the right until a slight stretch is felt. Turn it all the way to the right so you can look over your right shoulder. Do not tilt or tip your head. 4. Hold this position for 10-30 seconds. 5. Slowly turn your head (rotate) to the left until a slight stretch is felt. Turn it all the way to the left so you can look over your left shoulder. Do  not tilt or tip your head. 6. Hold this position for 10-30 seconds. Repeat __________ times. Complete this exercise __________ times a day. Neck retraction 1. Sit in a sturdy chair or stand up. 2. Look straight ahead. Do not bend your neck. 3. Use your fingers to push your chin backward (retraction). Do not bend your neck for this movement. Continue to face straight ahead. If you are doing the exercise properly, you will feel a slight sensation in your throat and a stretch at the back of your neck. 4. Hold the stretch for 1-2 seconds. Repeat __________ times. Complete this exercise __________ times a day. Strengthening exercises Neck press 1. Lie on your back on a firm bed or on the floor with a pillow under your head. 2. Use your neck muscles to push your head down on the pillow and straighten your spine. 3. Hold the position as well as you can. Keep your head facing up (in a neutral position) and your chin tucked. 4. Slowly count to 5 while holding this position. Repeat __________ times. Complete this exercise __________ times a day.  Isometrics These are exercises in which you strengthen the muscles in your neck while keeping your neck still (isometrics). 1. Sit in a supportive chair and place your hand on your forehead. 2. Keep your head and face facing straight ahead. Do not flex or extend your neck while doing isometrics. 3. Push forward with your head and neck while pushing back with your hand. Hold for 10 seconds. 4. Do the sequence again, this time putting your hand against the back of your head. Use your head and neck to push backward against the hand pressure. 5. Finally, do the same exercise on either side of your head, pushing sideways against the pressure of your hand. Repeat __________ times. Complete this exercise __________ times a day. Prone head lifts 1. Lie face-down (prone position), resting on your elbows so that your chest and upper back are raised. 2. Start with your  head facing downward, near your chest. Position your chin either on or near your chest. 3. Slowly lift your head upward. Lift until you are looking straight ahead. Then continue lifting your head as far back as you can comfortably stretch. 4. Hold your head up for 5 seconds. Then slowly lower it to your starting position. Repeat __________ times. Complete this exercise __________ times a day. Supine head lifts 1. Lie on your back (supine position), bending your knees to point to the ceiling and keeping your feet flat on the floor. 2. Lift your head slowly off the floor, raising your chin toward your chest. 3. Hold for 5 seconds. Repeat __________ times. Complete this exercise __________ times a day. Scapular retraction 1. Stand with your arms at your sides. Look straight ahead. 2. Slowly pull both shoulders (scapulae) backward and downward (retraction) until you feel a stretch between your shoulder blades in your upper back. 3. Hold for 10-30 seconds. 4. Relax and repeat. Repeat __________ times. Complete this exercise __________ times a day. Contact a health care provider if:  Your neck pain or discomfort gets much worse when you do an exercise.  Your neck pain or discomfort does not improve within 2 hours after you exercise. If you have any of these problems, stop exercising right away. Do not do the exercises again unless your health care provider says that you can. Get help right away if:  You develop sudden, severe neck pain. If this happens, stop exercising right away. Do not do the exercises again unless your health care provider says that you can. This information is not intended to replace advice given to you by your health care provider. Make sure you discuss any questions you have with your health care provider. Document Released: 10/23/2015 Document Revised: 09/09/2018 Document Reviewed: 09/09/2018 Elsevier Patient Education  2020 [employer].

## 2019-06-16 ENCOUNTER — Encounter: Payer: Self-pay | Admitting: Nurse Practitioner

## 2019-06-16 LAB — HEPATIC FUNCTION PANEL
ALT: 17 U/L (ref 0–35)
AST: 19 U/L (ref 0–37)
Albumin: 4.7 g/dL (ref 3.5–5.2)
Alkaline Phosphatase: 49 U/L (ref 39–117)
Bilirubin, Direct: 0.1 mg/dL (ref 0.0–0.3)
Total Bilirubin: 0.3 mg/dL (ref 0.2–1.2)
Total Protein: 7.4 g/dL (ref 6.0–8.3)

## 2019-06-16 LAB — LIPASE: Lipase: 60 U/L — ABNORMAL HIGH (ref 11.0–59.0)

## 2019-06-17 ENCOUNTER — Ambulatory Visit
Admission: RE | Admit: 2019-06-17 | Discharge: 2019-06-17 | Disposition: A | Discharge status: Home / Self Care | Payer: 59 | Source: Ambulatory Visit | Attending: Unknown Physician Specialty | Admitting: Unknown Physician Specialty

## 2019-06-17 ENCOUNTER — Other Ambulatory Visit: Payer: Self-pay

## 2019-06-17 DIAGNOSIS — R51 Headache: Secondary | ICD-10-CM | POA: Insufficient documentation

## 2019-06-17 DIAGNOSIS — R42 Dizziness and giddiness: Secondary | ICD-10-CM | POA: Insufficient documentation

## 2019-06-17 DIAGNOSIS — H5213 Myopia, bilateral: Secondary | ICD-10-CM | POA: Diagnosis not present

## 2019-06-17 DIAGNOSIS — R519 Headache, unspecified: Secondary | ICD-10-CM

## 2019-06-17 LAB — ANA: Anti Nuclear Antibody (ANA): NEGATIVE

## 2019-06-17 LAB — B. BURGDORFI ANTIBODIES: B burgdorferi Ab IgG+IgM: <0.9 index

## 2019-06-17 MED ORDER — GADOBUTROL 1 MMOL/ML IV SOLN
7.0000 mL | Freq: Once | INTRAVENOUS | Status: AC | PRN
  Administered 2019-06-17: 7 mL via INTRAVENOUS

## 2019-06-21 DIAGNOSIS — R42 Dizziness and giddiness: Secondary | ICD-10-CM | POA: Diagnosis not present

## 2019-06-21 DIAGNOSIS — J301 Allergic rhinitis due to pollen: Secondary | ICD-10-CM | POA: Diagnosis not present

## 2019-06-30 ENCOUNTER — Emergency Department (HOSPITAL_COMMUNITY): Payer: 59

## 2019-06-30 ENCOUNTER — Emergency Department (HOSPITAL_BASED_OUTPATIENT_CLINIC_OR_DEPARTMENT_OTHER)
Admission: EM | Admit: 2019-06-30 | Discharge: 2019-06-30 | Disposition: A | Discharge status: Home / Self Care | Payer: 59 | Attending: Emergency Medicine | Admitting: Emergency Medicine

## 2019-06-30 ENCOUNTER — Other Ambulatory Visit: Payer: Self-pay

## 2019-06-30 ENCOUNTER — Ambulatory Visit
Admission: RE | Admit: 2019-06-30 | Discharge: 2019-06-30 | Disposition: A | Discharge status: Home / Self Care | Payer: 59 | Source: Ambulatory Visit | Attending: Nurse Practitioner | Admitting: Nurse Practitioner

## 2019-06-30 ENCOUNTER — Emergency Department (HOSPITAL_BASED_OUTPATIENT_CLINIC_OR_DEPARTMENT_OTHER): Payer: 59

## 2019-06-30 ENCOUNTER — Encounter (HOSPITAL_BASED_OUTPATIENT_CLINIC_OR_DEPARTMENT_OTHER): Payer: Self-pay | Admitting: Emergency Medicine

## 2019-06-30 DIAGNOSIS — R1010 Upper abdominal pain, unspecified: Secondary | ICD-10-CM | POA: Diagnosis not present

## 2019-06-30 DIAGNOSIS — Z79899 Other long term (current) drug therapy: Secondary | ICD-10-CM | POA: Diagnosis not present

## 2019-06-30 DIAGNOSIS — R202 Paresthesia of skin: Secondary | ICD-10-CM | POA: Insufficient documentation

## 2019-06-30 DIAGNOSIS — R200 Anesthesia of skin: Secondary | ICD-10-CM

## 2019-06-30 DIAGNOSIS — R51 Headache: Secondary | ICD-10-CM | POA: Diagnosis not present

## 2019-06-30 DIAGNOSIS — R1013 Epigastric pain: Secondary | ICD-10-CM

## 2019-06-30 DIAGNOSIS — R110 Nausea: Secondary | ICD-10-CM | POA: Diagnosis not present

## 2019-06-30 DIAGNOSIS — R1084 Generalized abdominal pain: Secondary | ICD-10-CM

## 2019-06-30 DIAGNOSIS — R519 Headache, unspecified: Secondary | ICD-10-CM

## 2019-06-30 LAB — COMPREHENSIVE METABOLIC PANEL
ALT: 13 U/L (ref 0–44)
AST: 17 U/L (ref 15–41)
Albumin: 4.4 g/dL (ref 3.5–5.0)
Alkaline Phosphatase: 43 U/L (ref 38–126)
Anion gap: 9 (ref 5–15)
BUN: 5 mg/dL — ABNORMAL LOW (ref 6–20)
CO2: 24 mmol/L (ref 22–32)
Calcium: 9.3 mg/dL (ref 8.9–10.3)
Chloride: 108 mmol/L (ref 98–111)
Creatinine, Ser: 0.68 mg/dL (ref 0.44–1.00)
GFR calc Af Amer: >60 mL/min (ref >60)
GFR calc non Af Amer: >60 mL/min (ref >60)
Glucose, Bld: 113 mg/dL — ABNORMAL HIGH (ref 70–99)
Potassium: 3.7 mmol/L (ref 3.5–5.1)
Sodium: 141 mmol/L (ref 135–145)
Total Bilirubin: 0.5 mg/dL (ref 0.3–1.2)
Total Protein: 7.6 g/dL (ref 6.5–8.1)

## 2019-06-30 LAB — CBC WITH DIFFERENTIAL/PLATELET
Abs Immature Granulocytes: 0.01 10*3/uL (ref 0.00–0.07)
Basophils Absolute: 0 10*3/uL (ref 0.0–0.1)
Basophils Relative: 0 %
Eosinophils Absolute: 0.1 10*3/uL (ref 0.0–0.5)
Eosinophils Relative: 1 %
HCT: 41.6 % (ref 36.0–46.0)
Hemoglobin: 13.6 g/dL (ref 12.0–15.0)
Immature Granulocytes: 0 %
Lymphocytes Relative: 33 %
Lymphs Abs: 2.1 10*3/uL (ref 0.7–4.0)
MCH: 30.6 pg (ref 26.0–34.0)
MCHC: 32.7 g/dL (ref 30.0–36.0)
MCV: 93.7 fL (ref 80.0–100.0)
Monocytes Absolute: 0.4 10*3/uL (ref 0.1–1.0)
Monocytes Relative: 6 %
Neutro Abs: 3.8 10*3/uL (ref 1.7–7.7)
Neutrophils Relative %: 60 %
Platelets: 273 10*3/uL (ref 150–400)
RBC: 4.44 MIL/uL (ref 3.87–5.11)
RDW: 12.2 % (ref 11.5–15.5)
WBC: 6.3 10*3/uL (ref 4.0–10.5)
nRBC: 0 % (ref 0.0–0.2)

## 2019-06-30 LAB — PREGNANCY, URINE: Preg Test, Ur: NEGATIVE

## 2019-06-30 MED ORDER — SODIUM CHLORIDE 0.9 % IV BOLUS
1000.0000 mL | Freq: Once | INTRAVENOUS | Status: AC
  Administered 2019-06-30: 1000 mL via INTRAVENOUS

## 2019-06-30 MED ORDER — IOHEXOL 350 MG/ML SOLN
100.0000 mL | Freq: Once | INTRAVENOUS | Status: AC | PRN
  Administered 2019-06-30: 100 mL via INTRAVENOUS

## 2019-06-30 MED ORDER — GADOBUTROL 1 MMOL/ML IV SOLN
7.0000 mL | Freq: Once | INTRAVENOUS | Status: AC | PRN
  Administered 2019-06-30: 7 mL via INTRAVENOUS

## 2019-06-30 NOTE — ED Notes (Signed)
Patient transported to CT 

## 2019-06-30 NOTE — ED Triage Notes (Addendum)
Tingling on left side of face, left arm and left lower leg last night.  It was gone this morning when she woke up but it is back now.  Also left sided HA x6 days.  Pt is on a 30-day event heart monitor for tachycardia and palpitations with dizziness that started 3 months ago. Dr. [name] is following her for this.

## 2019-06-30 NOTE — ED Provider Notes (Signed)
[facility] EMERGENCY DEPARTMENT Provider Note   CSN: [id number] Arrival date & time: 06/30/19  1147    History   Chief Complaint Chief Complaint  Patient presents with   tingling on left    HPI Melanie Jordan is a 28 y.o. female.     HPI   28 year old female with a history of palpitations, adjustment disorder with anxious mood, ADHD, recent evaluation for disequilibrium with MRI, undergoing current cardiac monitoring for palpitations, who presents with concern for left-sided numbness and tingling.  Left sided face, arm, leg tingling began last night around 8PM, went to bed, woke up this morning feeling better, then around 9AM developed left face and arm tingling which has been constant. No other symptoms.   Has not had symptoms like this before.   Headache for 6 days, not very severe, left sided, sharp at times and pressure on that side of head.  Has had history of headaches in the past, but has not had associated neurologic symptoms. D ysequilibrium for 3 mos, had outpt MRI which was ok, has bodyguard monitor in place with Dr. Ellyn Hack for palpitations/dizziness  With coming from a right upper quadrant ultrasound today with symptoms started.  Reports that she is also had months of nausea and abdominal pain.  No smoking/etoh/drugs  Past Medical History:  Diagnosis Date   ADHD    now off Adderal    GERD (gastroesophageal reflux disease)     Patient Active Problem List   Diagnosis Date Noted   Dizziness on standing 05/24/2019   Rapid palpitations 05/24/2019   Adjustment disorder with anxious mood 04/02/2019   Initiation of oral contraception 09/21/2018    History reviewed. No pertinent surgical history.   OB History   No obstetric history on file.      Home Medications    Prior to Admission medications   Medication Sig Start Date End Date Taking? Authorizing Provider  Magnesium Citrate 125 MG CAPS Take 250 mg by mouth at bedtime.   Yes  [provider]  magnesium citrate SOLN Take 1 Bottle by mouth once.   Yes [provider]  ADDERALL XR 30 MG 24 hr capsule Take by mouth daily. 05/25/19   [provider]  ALPRAZolam Duanne Moron) 0.25 MG tablet Take 1-2 tablets (0.25-0.5 mg total) by mouth at bedtime as needed for anxiety. 04/13/19   Nche, Charlene Brooke, NP  Cetirizine HCl (ZYRTEC PO) Take by mouth.    [provider]  methocarbamol (ROBAXIN) 500 MG tablet Take 1 tablet (500 mg total) by mouth every 8 (eight) hours as needed for muscle spasms. 06/15/19   Nche, Charlene Brooke, NP  norethindrone-ethinyl estradiol (JUNEL FE,GILDESS FE,LOESTRIN FE) 1-20 MG-MCG tablet Take 1 tablet by mouth daily. 09/21/18   Nche, Charlene Brooke, NP  omeprazole (PRILOSEC) 20 MG capsule TAKE 1 CAPSULE BY MOUTH EVERY DAY 06/14/19   Nche, Charlene Brooke, NP    Family History Family History  Problem Relation Age of Onset   Cancer Mother 71       cervical and stomach cancer   Fibroids Mother        uterine fibroid   Thyroid disease Mother        hypothyroidism due to [place]   Osteopenia Mother    GER disease Mother    Drug abuse Mother    Asthma Brother    Anxiety disorder Brother    Arthritis Maternal Grandmother    Depression Maternal Grandmother    Heart disease Maternal  Grandmother 29       CAD with stents   Hyperlipidemia Maternal Grandmother    Hypertension Maternal Grandmother    Stroke Maternal Grandmother 72       TIA   Cancer Paternal Grandmother 80       uterine cancer   Asthma Brother    Anxiety disorder Sister     Social History Social History   Tobacco Use   Smoking status: Never Smoker   Smokeless tobacco: Never Used  Substance Use Topics   Alcohol use: Yes    Comment: social   Drug use: Never     Allergies   Patient has no known allergies.   Review of Systems Review of Systems  Constitutional: Negative for fever.  HENT: Negative for sore throat.   Eyes:  Negative for visual disturbance.  Respiratory: Negative for cough and shortness of breath.   Cardiovascular: Positive for palpitations. Negative for chest pain.  Gastrointestinal: Positive for abdominal pain (no acute changes today but undergoing work up for abd pain/n/v), nausea and vomiting.  Genitourinary: Negative for difficulty urinating.  Musculoskeletal: Negative for back pain and neck pain.  Skin: Negative for rash.  Neurological: Positive for dizziness, light-headedness and numbness. Negative for syncope, speech difficulty, weakness and headaches.     Physical Exam Updated Vital Signs BP 114/71    Pulse 67    Temp 98.2 F (36.8 C) (Oral)    Resp 15    Ht 5\' 7"  (1.702 m)    Wt 68 kg    LMP 06/11/2019    SpO2 100%    BMI 23.49 kg/m   Physical Exam Vitals signs and nursing note reviewed.  Constitutional:      General: She is not in acute distress.    Appearance: She is well-developed. She is not diaphoretic.  HENT:     Head: Normocephalic and atraumatic.  Eyes:     Conjunctiva/sclera: Conjunctivae normal.  Neck:     Musculoskeletal: Normal range of motion.  Cardiovascular:     Rate and Rhythm: Normal rate and regular rhythm.     Heart sounds: Normal heart sounds.  Pulmonary:     Effort: Pulmonary effort is normal. No respiratory distress.     Breath sounds: Normal breath sounds.  Abdominal:     General: There is no distension.     Palpations: Abdomen is soft.     Tenderness: There is no abdominal tenderness. There is no guarding.  Musculoskeletal:        General: No tenderness.  Skin:    General: Skin is warm and dry.     Findings: No erythema or rash.  Neurological:     Mental Status: She is alert and oriented to person, place, and time.     GCS: GCS eye subscore is 4. GCS verbal subscore is 5. GCS motor subscore is 6.     Cranial Nerves: Cranial nerves are intact.     Sensory: Sensory deficit (left arm, face) present.     Motor: Motor function is intact. No  weakness, tremor or pronator drift.     Coordination: Coordination is intact. Coordination normal.      ED Treatments / Results  Labs (all labs ordered are listed, but only abnormal results are displayed) Labs Reviewed  COMPREHENSIVE METABOLIC PANEL - Abnormal; Notable for the following components:      Result Value   Glucose, Bld 113 (*)    BUN 5 (*)    All other components within normal  limits  CBC WITH DIFFERENTIAL/PLATELET  PREGNANCY, URINE    EKG EKG Interpretation  Date/Time:  Wednesday June 30 2019 11:57:40 EDT Ventricular Rate:  83 PR Interval:    QRS Duration: 92 QT Interval:  364 QTC Calculation: 428 R Axis:   74 Text Interpretation:  Sinus rhythm Baseline wander in lead(s) V4 No significant change since last tracing Confirmed by Gareth Morgan [phone]) on 06/30/2019 1:16:09 PM   Radiology Ct Angio Head W Or Wo Contrast  Result Date: 06/30/2019 CLINICAL DATA:  Left-sided face arm numbness and tingling beginning last night. EXAM: CT ANGIOGRAPHY HEAD AND NECK TECHNIQUE: Multidetector CT imaging of the head and neck was performed using the standard protocol during bolus administration of intravenous contrast. Multiplanar CT image reconstructions and MIPs were obtained to evaluate the vascular anatomy. Carotid stenosis measurements (when applicable) are obtained utilizing NASCET criteria, using the distal internal carotid diameter as the denominator. CONTRAST:  135mL OMNIPAQUE IOHEXOL 350 MG/ML SOLN COMPARISON:  MRI 06/17/2019 FINDINGS: CT HEAD FINDINGS Brain: The brain shows a normal appearance without evidence of malformation, atrophy, old or acute small or large vessel infarction, mass lesion, hemorrhage, hydrocephalus or extra-axial collection. Vascular: No hyperdense vessel. No evidence of atherosclerotic calcification. Skull: Normal.  No traumatic finding.  No focal bone lesion. Sinuses/Orbits: Sinuses are clear. Orbits appear normal. Mastoids are clear. Other: None  significant CTA NECK FINDINGS Aortic arch: Normal Right carotid system: Normal Left carotid system: Normal Vertebral arteries: Normal Skeleton: Normal Other neck: Normal Upper chest: Normal Review of the MIP images confirms the above findings CTA HEAD FINDINGS Anterior circulation: Both internal carotid arteries are widely patent through the skull base and siphon regions. The anterior and middle cerebral vessels are patent without proximal stenosis, aneurysm or vascular malformation. No missing branch vessels are identified. Posterior circulation: Both vertebral arteries are patent to the basilar. No basilar stenosis. Posterior circulation branch vessels are. Venous sinuses: Normal Anatomic variants: None Review of the MIP images confirms the above findings IMPRESSION: Normal head CT.  Normal CT angiography of the head and neck. Electronically Signed   By: Nelson Chimes M.D.   On: 06/30/2019 13:06   Ct Angio Neck W And/or Wo Contrast  Result Date: 06/30/2019 CLINICAL DATA:  Left-sided face arm numbness and tingling beginning last night. EXAM: CT ANGIOGRAPHY HEAD AND NECK TECHNIQUE: Multidetector CT imaging of the head and neck was performed using the standard protocol during bolus administration of intravenous contrast. Multiplanar CT image reconstructions and MIPs were obtained to evaluate the vascular anatomy. Carotid stenosis measurements (when applicable) are obtained utilizing NASCET criteria, using the distal internal carotid diameter as the denominator. CONTRAST:  152mL OMNIPAQUE IOHEXOL 350 MG/ML SOLN COMPARISON:  MRI 06/17/2019 FINDINGS: CT HEAD FINDINGS Brain: The brain shows a normal appearance without evidence of malformation, atrophy, old or acute small or large vessel infarction, mass lesion, hemorrhage, hydrocephalus or extra-axial collection. Vascular: No hyperdense vessel. No evidence of atherosclerotic calcification. Skull: Normal.  No traumatic finding.  No focal bone lesion. Sinuses/Orbits:  Sinuses are clear. Orbits appear normal. Mastoids are clear. Other: None significant CTA NECK FINDINGS Aortic arch: Normal Right carotid system: Normal Left carotid system: Normal Vertebral arteries: Normal Skeleton: Normal Other neck: Normal Upper chest: Normal Review of the MIP images confirms the above findings CTA HEAD FINDINGS Anterior circulation: Both internal carotid arteries are widely patent through the skull base and siphon regions. The anterior and middle cerebral vessels are patent without proximal stenosis, aneurysm or vascular malformation. No missing branch  vessels are identified. Posterior circulation: Both vertebral arteries are patent to the basilar. No basilar stenosis. Posterior circulation branch vessels are. Venous sinuses: Normal Anatomic variants: None Review of the MIP images confirms the above findings IMPRESSION: Normal head CT.  Normal CT angiography of the head and neck. Electronically Signed   By: Nelson Chimes M.D.   On: 06/30/2019 13:06   US Abdomen Limited Ruq  Result Date: 06/30/2019 CLINICAL DATA:  Upper abdominal pain with nausea EXAM: ULTRASOUND ABDOMEN LIMITED RIGHT UPPER QUADRANT COMPARISON:  None. FINDINGS: Gallbladder: No gallstones or wall thickening visualized. No sonographic Murphy sign noted by sonographer. Common bile duct: Diameter: 2.2 mm Liver: No focal lesion identified. Within normal limits in parenchymal echogenicity. Portal vein is patent on color Doppler imaging with normal direction of blood flow towards the liver. Other: None. IMPRESSION: Negative right upper quadrant abdominal ultrasound Electronically Signed   By: Donavan Foil M.D.   On: 06/30/2019 14:49    Procedures Procedures (including critical care time)  Medications Ordered in ED Medications  sodium chloride 0.9 % bolus 1,000 mL (0 mLs Intravenous Stopped 06/30/19 1436)  iohexol (OMNIPAQUE) 350 MG/ML injection 100 mL (100 mLs Intravenous Contrast Given 06/30/19 1238)     Initial Impression  / Assessment and Plan / ED Course  I have reviewed the triage vital signs and the nursing notes.  Pertinent labs & imaging results that were available during my care of the patient were reviewed by me and considered in my medical decision making (see chart for details).        28 year old female with a history of palpitations, adjustment disorder with anxious mood, ADHD, recent evaluation for disequilibrium with MRI, undergoing current cardiac monitoring for palpitations, who presents with concern for left-sided numbness and tingling.  DDx includes CVA, MS, complicated migraine, anxiety.  CTA head/neck WNL. Labwork without significant electrolyte abnormalities.  Given patient is currently undergoing palpitations work up, it is difficult to exculde afib as potential risk factor although pt in normal sinus rhythm at this time. Given new numbness, will transfer to [facility] to evaluate for CVA or MS.       Final Clinical Impressions(s) / ED Diagnoses   Final diagnoses:  Numbness and tingling  Nonintractable headache, unspecified chronicity pattern, unspecified headache type    ED Discharge Orders    None       Gareth Morgan, MD 06/30/19 1507

## 2019-06-30 NOTE — ED Provider Notes (Signed)
Patient presents today in transfer from [facility] for an MRI, please see note by Dr. Billy Fischer for full H&P.  Briefly patient has had left-sided face, arm, and leg tingling since last night.  The symptoms have been intermittent.  At the time of transfer she had left-sided face and arm tingling.   Physical Exam  BP 129/72   Pulse 83   Temp 98.8 F (37.1 C) (Oral)   Resp 16   Ht 5\' 7"  (1.702 m)   Wt 68 kg   LMP 06/11/2019   SpO2 100%   BMI 23.49 kg/m   Physical Exam Vitals signs and nursing note reviewed.  Constitutional:      General: She is not in acute distress.    Appearance: She is well-developed. She is not diaphoretic.     Comments: Seated in bed, no distress  HENT:     Head: Normocephalic and atraumatic.  Eyes:     General: No scleral icterus.       Right eye: No discharge.        Left eye: No discharge.     Conjunctiva/sclera: Conjunctivae normal.  Neck:     Musculoskeletal: Normal range of motion.  Cardiovascular:     Rate and Rhythm: Normal rate and regular rhythm.  Pulmonary:     Effort: Pulmonary effort is normal. No respiratory distress.     Breath sounds: No stridor.  Abdominal:     General: There is no distension.  Musculoskeletal:        General: No deformity.     Comments: There is tenderness palpation over the left-sided posterior shoulder/trapezius muscle.  This palpation continues along the left-sided cervical paraspinal muscles and into the head.  Palpation in these areas both re-creates and exacerbates her reported pain.  Skin:    General: Skin is warm and dry.  Neurological:     Mental Status: She is alert and oriented to person, place, and time.     Motor: No abnormal muscle tone.  Psychiatric:        Behavior: Behavior normal.     ED Course/Procedures   Clinical Course as of Jun 29 1740  Wed Jun 30, 2019  1739 Patient reevaluated, she says that she is feeling better.  She does have left-sided trapezius muscle tenderness to  palpation which worsens her headache.  I am able to exacerbate her tingling in her left arm with palpation over anterior shoulder/chest.  We discussed results, she states she is ready to go home.   [EH]    Clinical Course User Index [EH] Ollen Gross    Procedures   Mr Brain W And Wo Contrast  Result Date: 06/30/2019 CLINICAL DATA:  28 year old female with palpitations, left side numbness and tingling of the face and extremities since 2000 hours yesterday. EXAM: MRI HEAD WITHOUT AND WITH CONTRAST TECHNIQUE: Multiplanar, multiecho pulse sequences of the brain and surrounding structures were obtained without and with intravenous contrast. CONTRAST:  7 milliliters Gadavist COMPARISON:  CT head, CTA head and neck earlier today. Brain MRI 06/17/2019. FINDINGS: Brain: Normal cerebral volume. No restricted diffusion to suggest acute infarction. No midline shift, mass effect, evidence of mass lesion, ventriculomegaly, extra-axial collection or acute intracranial hemorrhage. Cervicomedullary junction and pituitary are within normal limits. Pearline Cables and white matter signal remains normal for age. No chronic blood products. No cortical encephalomalacia. No abnormal enhancement identified. No dural thickening. Vascular: Major intracranial vascular flow voids are stable. The major dural venous sinuses are  enhancing and appear to be patent. Skull and upper cervical spine: Negative visible cervical spine. Visualized bone marrow signal is within normal limits. Sinuses/Orbits: Stable and negative. Other: Mastoids remain clear. Visible internal auditory structures appear normal. Scalp and face soft tissues appear negative. IMPRESSION: Stable and normal MRI appearance of the brain. Electronically Signed   By: Genevie Ann M.D.   On: 06/30/2019 17:16     MDM  Patient presents today in transfer for MRI.  Her MRI was reassuring.  Patient states that she has outpatient neurology follow-up scheduled in September.  She  reports that she feels ready to go home and is feeling better overall.  I suspect that her headache may be musculoskeletal in nature.  Return precautions were discussed with patient who states their understanding.  At the time of discharge patient denied any unaddressed complaints or concerns.  Patient is agreeable for discharge home.        Lorin Glass, PA-C 06/30/19 [place], [place], DO 06/30/19 [place]rose Mantle

## 2019-06-30 NOTE — ED Notes (Signed)
Pt arrives to ED via [facility] from, [facility] for MRI. Pt c.o left sided tingling since last night. NIH 0. Pt alert and oriented.

## 2019-06-30 NOTE — ED Notes (Signed)
MRI stated pt is next

## 2019-06-30 NOTE — ED Notes (Signed)
All appropriate discharge materials reviewed with patient at length. Time for questions provided. Pt denies any further questions at this time. Verbalizes understanding of all provided materials.  

## 2019-06-30 NOTE — ED Notes (Signed)
[facility] arrived to transport pt to [facility] 

## 2019-07-01 ENCOUNTER — Encounter: Payer: Self-pay | Admitting: Nurse Practitioner

## 2019-07-01 ENCOUNTER — Other Ambulatory Visit: Payer: 59

## 2019-07-01 DIAGNOSIS — R1084 Generalized abdominal pain: Secondary | ICD-10-CM | POA: Diagnosis not present

## 2019-07-01 DIAGNOSIS — F4322 Adjustment disorder with anxiety: Secondary | ICD-10-CM

## 2019-07-01 DIAGNOSIS — R002 Palpitations: Secondary | ICD-10-CM

## 2019-07-01 DIAGNOSIS — R1013 Epigastric pain: Secondary | ICD-10-CM | POA: Diagnosis not present

## 2019-07-01 NOTE — Addendum Note (Signed)
Addended by: Lynnea Ferrier on: 07/01/2019 02:13 PM   Modules accepted: Orders

## 2019-07-02 LAB — HELICOBACTER PYLORI  SPECIAL ANTIGEN
MICRO NUMBER:: 744242
SPECIMEN QUALITY: ADEQUATE

## 2019-07-05 ENCOUNTER — Other Ambulatory Visit (INDEPENDENT_AMBULATORY_CARE_PROVIDER_SITE_OTHER): Payer: 59

## 2019-07-05 ENCOUNTER — Other Ambulatory Visit: Payer: Self-pay

## 2019-07-05 DIAGNOSIS — R002 Palpitations: Secondary | ICD-10-CM

## 2019-07-05 DIAGNOSIS — F4322 Adjustment disorder with anxiety: Secondary | ICD-10-CM

## 2019-07-06 ENCOUNTER — Encounter: Payer: Self-pay | Admitting: Nurse Practitioner

## 2019-07-06 ENCOUNTER — Ambulatory Visit (INDEPENDENT_AMBULATORY_CARE_PROVIDER_SITE_OTHER): Payer: 59 | Admitting: Nurse Practitioner

## 2019-07-06 VITALS — BP 116/72 | HR 72 | Temp 98.6°F | Ht 67.0 in | Wt 150.2 lb

## 2019-07-06 DIAGNOSIS — R42 Dizziness and giddiness: Secondary | ICD-10-CM

## 2019-07-06 DIAGNOSIS — R51 Headache: Secondary | ICD-10-CM | POA: Diagnosis not present

## 2019-07-06 DIAGNOSIS — R519 Headache, unspecified: Secondary | ICD-10-CM

## 2019-07-06 DIAGNOSIS — R2241 Localized swelling, mass and lump, right lower limb: Secondary | ICD-10-CM | POA: Diagnosis not present

## 2019-07-06 DIAGNOSIS — G8929 Other chronic pain: Secondary | ICD-10-CM

## 2019-07-06 LAB — THYROID PANEL WITH TSH
Free Thyroxine Index: 2.5 (ref 1.4–3.8)
T3 Uptake: 23 % (ref 22–35)
T4, Total: 10.8 ug/dL (ref 5.1–11.9)
TSH: 4.3 mIU/L

## 2019-07-06 MED ORDER — SUMATRIPTAN SUCCINATE 25 MG PO TABS: 25.0000 mg | ORAL_TABLET | ORAL | 0 refills | Status: DC | PRN

## 2019-07-06 NOTE — Patient Instructions (Signed)
Maintain appt with neurology and therapist.

## 2019-07-06 NOTE — Progress Notes (Signed)
Subjective:  Patient ID: Melanie Jordan, female    DOB: 1991-10-18  Age: 28 y.o. MRN: [id number]  CC: Hospitalization Follow-up (hospital follow up--still have some thingling and numbness--headache everyday still. has PT appt on Monday. )  Headache  This is a recurrent problem. The current episode started more than 1 month ago. The problem occurs constantly. The problem has been unchanged. The pain is located in the left unilateral region. The pain radiates to the face, left arm, left neck, left shoulder and upper back. The pain quality is similar to prior headaches. The quality of the pain is described as dull and aching. Associated symptoms include dizziness, muscle aches, neck pain, numbness and tingling. Pertinent negatives include no blurred vision, coughing, drainage, fever, hearing loss, insomnia, phonophobia, photophobia, rhinorrhea, scalp tenderness, sinus pressure, tinnitus, visual change or weakness. The symptoms are aggravated by activity. She has tried acetaminophen, Excedrin and NSAIDs for the symptoms. The treatment provided no relief. Her past medical history is significant for migraine headaches. There is no history of cancer, cluster headaches, hypertension, immunosuppression, migraines in the family, obesity, recent head traumas or sinus disease.  upcoming appt with neurology, PT and therapist.  Mass on plantar surface of foot x 38months, pain with prolonged standing.  Reviewed past Medical, Social and Family history today.  Outpatient Medications Prior to Visit  Medication Sig Dispense Refill   ALPRAZolam (XANAX) 0.25 MG tablet Take 1-2 tablets (0.25-0.5 mg total) by mouth at bedtime as needed for anxiety. 14 tablet 0   Cetirizine HCl (ZYRTEC PO) Take by mouth.     ibuprofen (ADVIL) 600 MG tablet Take 600 mg by mouth every 6 (six) hours as needed. headache     Magnesium Citrate 125 MG CAPS Take 250 mg by mouth 2 (two) times daily.      methocarbamol (ROBAXIN) 500 MG  tablet Take 1 tablet (500 mg total) by mouth every 8 (eight) hours as needed for muscle spasms. 30 tablet 0   norethindrone-ethinyl estradiol (JUNEL FE,GILDESS FE,LOESTRIN FE) 1-20 MG-MCG tablet Take 1 tablet by mouth daily. 3 Package 3   ADDERALL XR 30 MG 24 hr capsule Take by mouth daily.     magnesium citrate SOLN Take 1 Bottle by mouth once.     omeprazole (PRILOSEC) 20 MG capsule TAKE 1 CAPSULE BY MOUTH EVERY DAY (Patient not taking: Reported on 07/06/2019) 30 capsule 0   No facility-administered medications prior to visit.     ROS See HPI  Objective:  BP 116/72    Pulse 72    Temp 98.6 F (37 C) (Tympanic)    Ht 5\' 7"  (1.702 m)    Wt 150 lb 3.2 oz (68.1 kg)    LMP 06/11/2019    SpO2 100%    BMI 23.52 kg/m   BP Readings from Last 3 Encounters:  07/06/19 116/72  06/30/19 125/67  06/15/19 118/84    Wt Readings from Last 3 Encounters:  07/06/19 150 lb 3.2 oz (68.1 kg)  06/30/19 150 lb (68 kg)  06/15/19 152 lb 12.8 oz (69.3 kg)    Physical Exam Vitals signs reviewed.  HENT:     Right Ear: Tympanic membrane, ear canal and external ear normal.     Left Ear: Tympanic membrane, ear canal and external ear normal.  Eyes:     Extraocular Movements: Extraocular movements intact.     Conjunctiva/sclera: Conjunctivae normal.     Pupils: Pupils are equal, round, and reactive to light.  Neck:  Musculoskeletal: Normal range of motion and neck supple.     Thyroid: No thyroid mass, thyromegaly or thyroid tenderness.  Cardiovascular:     Rate and Rhythm: Normal rate and regular rhythm.     Pulses: Normal pulses.     Heart sounds: Normal heart sounds.  Pulmonary:     Effort: Pulmonary effort is normal.     Breath sounds: Normal breath sounds.  Musculoskeletal: Normal range of motion.     Right lower leg: No edema.     Left lower leg: No edema.       Feet:  Feet:     Right foot:     Skin integrity: Skin integrity normal.     Left foot:     Skin integrity: Skin integrity  normal.  Lymphadenopathy:     Cervical: No cervical adenopathy.     Right cervical: No superficial, deep or posterior cervical adenopathy.    Left cervical: No superficial, deep or posterior cervical adenopathy.  Neurological:     Mental Status: She is alert and oriented to person, place, and time.     Motor: No weakness.     Coordination: Coordination normal.     Gait: Gait normal.     Deep Tendon Reflexes: Reflexes normal.  Psychiatric:        Mood and Affect: Mood normal.        Behavior: Behavior normal.        Thought Content: Thought content normal.     Lab Results  Component Value Date   WBC 6.3 06/30/2019   HGB 13.6 06/30/2019   HCT 41.6 06/30/2019   PLT 273 06/30/2019   GLUCOSE 113 (H) 06/30/2019   CHOL 125 09/21/2018   TRIG 60.0 09/21/2018   HDL 47.30 09/21/2018   LDLCALC 65 09/21/2018   ALT 13 06/30/2019   AST 17 06/30/2019   NA 141 06/30/2019   K 3.7 06/30/2019   CL 108 06/30/2019   CREATININE 0.68 06/30/2019   BUN 5 (L) 06/30/2019   CO2 24 06/30/2019   TSH 4.30 07/05/2019    Ct Angio Head W Or Wo Contrast  Result Date: 06/30/2019 CLINICAL DATA:  Left-sided face arm numbness and tingling beginning last night. EXAM: CT ANGIOGRAPHY HEAD AND NECK TECHNIQUE: Multidetector CT imaging of the head and neck was performed using the standard protocol during bolus administration of intravenous contrast. Multiplanar CT image reconstructions and MIPs were obtained to evaluate the vascular anatomy. Carotid stenosis measurements (when applicable) are obtained utilizing NASCET criteria, using the distal internal carotid diameter as the denominator. CONTRAST:  14mL OMNIPAQUE IOHEXOL 350 MG/ML SOLN COMPARISON:  MRI 06/17/2019 FINDINGS: CT HEAD FINDINGS Brain: The brain shows a normal appearance without evidence of malformation, atrophy, old or acute small or large vessel infarction, mass lesion, hemorrhage, hydrocephalus or extra-axial collection. Vascular: No hyperdense vessel.  No evidence of atherosclerotic calcification. Skull: Normal.  No traumatic finding.  No focal bone lesion. Sinuses/Orbits: Sinuses are clear. Orbits appear normal. Mastoids are clear. Other: None significant CTA NECK FINDINGS Aortic arch: Normal Right carotid system: Normal Left carotid system: Normal Vertebral arteries: Normal Skeleton: Normal Other neck: Normal Upper chest: Normal Review of the MIP images confirms the above findings CTA HEAD FINDINGS Anterior circulation: Both internal carotid arteries are widely patent through the skull base and siphon regions. The anterior and middle cerebral vessels are patent without proximal stenosis, aneurysm or vascular malformation. No missing branch vessels are identified. Posterior circulation: Both vertebral arteries are patent to the  basilar. No basilar stenosis. Posterior circulation branch vessels are. Venous sinuses: Normal Anatomic variants: None Review of the MIP images confirms the above findings IMPRESSION: Normal head CT.  Normal CT angiography of the head and neck. Electronically Signed   By: Nelson Chimes M.D.   On: 06/30/2019 13:06   Ct Angio Neck W And/or Wo Contrast  Result Date: 06/30/2019 CLINICAL DATA:  Left-sided face arm numbness and tingling beginning last night. EXAM: CT ANGIOGRAPHY HEAD AND NECK TECHNIQUE: Multidetector CT imaging of the head and neck was performed using the standard protocol during bolus administration of intravenous contrast. Multiplanar CT image reconstructions and MIPs were obtained to evaluate the vascular anatomy. Carotid stenosis measurements (when applicable) are obtained utilizing NASCET criteria, using the distal internal carotid diameter as the denominator. CONTRAST:  157mL OMNIPAQUE IOHEXOL 350 MG/ML SOLN COMPARISON:  MRI 06/17/2019 FINDINGS: CT HEAD FINDINGS Brain: The brain shows a normal appearance without evidence of malformation, atrophy, old or acute small or large vessel infarction, mass lesion, hemorrhage,  hydrocephalus or extra-axial collection. Vascular: No hyperdense vessel. No evidence of atherosclerotic calcification. Skull: Normal.  No traumatic finding.  No focal bone lesion. Sinuses/Orbits: Sinuses are clear. Orbits appear normal. Mastoids are clear. Other: None significant CTA NECK FINDINGS Aortic arch: Normal Right carotid system: Normal Left carotid system: Normal Vertebral arteries: Normal Skeleton: Normal Other neck: Normal Upper chest: Normal Review of the MIP images confirms the above findings CTA HEAD FINDINGS Anterior circulation: Both internal carotid arteries are widely patent through the skull base and siphon regions. The anterior and middle cerebral vessels are patent without proximal stenosis, aneurysm or vascular malformation. No missing branch vessels are identified. Posterior circulation: Both vertebral arteries are patent to the basilar. No basilar stenosis. Posterior circulation branch vessels are. Venous sinuses: Normal Anatomic variants: None Review of the MIP images confirms the above findings IMPRESSION: Normal head CT.  Normal CT angiography of the head and neck. Electronically Signed   By: Nelson Chimes M.D.   On: 06/30/2019 13:06   Mr Jeri Cos And Wo Contrast  Result Date: 06/30/2019 CLINICAL DATA:  28 year old female with palpitations, left side numbness and tingling of the face and extremities since 2000 hours yesterday. EXAM: MRI HEAD WITHOUT AND WITH CONTRAST TECHNIQUE: Multiplanar, multiecho pulse sequences of the brain and surrounding structures were obtained without and with intravenous contrast. CONTRAST:  7 milliliters Gadavist COMPARISON:  CT head, CTA head and neck earlier today. Brain MRI 06/17/2019. FINDINGS: Brain: Normal cerebral volume. No restricted diffusion to suggest acute infarction. No midline shift, mass effect, evidence of mass lesion, ventriculomegaly, extra-axial collection or acute intracranial hemorrhage. Cervicomedullary junction and pituitary are within  normal limits. Pearline Cables and white matter signal remains normal for age. No chronic blood products. No cortical encephalomalacia. No abnormal enhancement identified. No dural thickening. Vascular: Major intracranial vascular flow voids are stable. The major dural venous sinuses are enhancing and appear to be patent. Skull and upper cervical spine: Negative visible cervical spine. Visualized bone marrow signal is within normal limits. Sinuses/Orbits: Stable and negative. Other: Mastoids remain clear. Visible internal auditory structures appear normal. Scalp and face soft tissues appear negative. IMPRESSION: Stable and normal MRI appearance of the brain. Electronically Signed   By: Genevie Ann M.D.   On: 06/30/2019 17:16   US Abdomen Limited Ruq  Result Date: 06/30/2019 CLINICAL DATA:  Upper abdominal pain with nausea EXAM: ULTRASOUND ABDOMEN LIMITED RIGHT UPPER QUADRANT COMPARISON:  None. FINDINGS: Gallbladder: No gallstones or wall thickening visualized. No  sonographic Percell Miller sign noted by sonographer. Common bile duct: Diameter: 2.2 mm Liver: No focal lesion identified. Within normal limits in parenchymal echogenicity. Portal vein is patent on color Doppler imaging with normal direction of blood flow towards the liver. Other: None. IMPRESSION: Negative right upper quadrant abdominal ultrasound Electronically Signed   By: Donavan Foil M.D.   On: 06/30/2019 14:49    Assessment & Plan:   Ashika was seen today for hospitalization follow-up.  Diagnoses and all orders for this visit:  Dizziness -     SUMAtriptan (IMITREX) 25 MG tablet; Take 1 tablet (25 mg total) by mouth every 2 (two) hours as needed for migraine. No more than 100mg  in 24hrs  Chronic nonintractable headache, unspecified headache type -     SUMAtriptan (IMITREX) 25 MG tablet; Take 1 tablet (25 mg total) by mouth every 2 (two) hours as needed for migraine. No more than 100mg  in 24hrs  Subcutaneous mass of right foot -     Ambulatory referral to  Podiatry   I am having Mertha A. Wanzer start on SUMAtriptan. I am also having her maintain her norethindrone-ethinyl estradiol, ALPRAZolam, Cetirizine HCl (ZYRTEC PO), Adderall XR, omeprazole, methocarbamol, magnesium citrate, Magnesium Citrate, and ibuprofen.  Meds ordered this encounter  Medications   SUMAtriptan (IMITREX) 25 MG tablet    Sig: Take 1 tablet (25 mg total) by mouth every 2 (two) hours as needed for migraine. No more than 100mg  in 24hrs    Dispense:  10 tablet    Refill:  0    Order Specific Question:   Supervising Provider    Answer:   Lucille Passy [[id number]]    Problem List Items Addressed This Visit    None    Visit Diagnoses    Dizziness    -  Primary   Relevant Medications   SUMAtriptan (IMITREX) 25 MG tablet   Chronic nonintractable headache, unspecified headache type       Relevant Medications   ibuprofen (ADVIL) 600 MG tablet   SUMAtriptan (IMITREX) 25 MG tablet   Subcutaneous mass of right foot       Relevant Orders   Ambulatory referral to Podiatry       Follow-up: Return if symptoms worsen or fail to improve.  Wilfred Lacy, NP

## 2019-07-07 ENCOUNTER — Encounter: Payer: Self-pay | Admitting: Nurse Practitioner

## 2019-07-08 ENCOUNTER — Ambulatory Visit (HOSPITAL_COMMUNITY): Payer: 59 | Admitting: Licensed Clinical Social Worker

## 2019-07-08 NOTE — Telephone Encounter (Signed)
Is she taking NSAID (ibuprofen, aleve, excedrin) for her headaches still?  Sometimes these can cause rash.  Recommend she d/c any of these medications for now and try benadryl for rash/itching.  Let us know if not improving with this approach or for any worsening symptoms.

## 2019-07-09 ENCOUNTER — Encounter: Payer: Self-pay | Admitting: Nurse Practitioner

## 2019-07-12 ENCOUNTER — Encounter: Payer: Self-pay | Admitting: Nurse Practitioner

## 2019-07-12 ENCOUNTER — Other Ambulatory Visit: Payer: Self-pay

## 2019-07-12 ENCOUNTER — Encounter: Payer: Self-pay | Admitting: Physical Therapy

## 2019-07-12 ENCOUNTER — Ambulatory Visit: Payer: 59 | Attending: Nurse Practitioner | Admitting: Physical Therapy

## 2019-07-12 DIAGNOSIS — R42 Dizziness and giddiness: Secondary | ICD-10-CM | POA: Insufficient documentation

## 2019-07-12 DIAGNOSIS — M62838 Other muscle spasm: Secondary | ICD-10-CM | POA: Diagnosis not present

## 2019-07-12 DIAGNOSIS — G44201 Tension-type headache, unspecified, intractable: Secondary | ICD-10-CM | POA: Insufficient documentation

## 2019-07-12 NOTE — Therapy (Signed)
[facility] [place], [place], [address] Phone: [phone]   Fax:  [phone]  Physical Therapy Treatment  Patient Details  Name: Melanie Jordan MRN: [id number] Date of Birth: 1991-03-18 Referring Provider (PT): Nche   Encounter Date: 07/12/2019  PT End of Session - 07/12/19 0925    Visit Number  3    Number of Visits  25    Date for PT Re-Evaluation  08/01/19    PT Start Time  0844    PT Stop Time  0939    PT Time Calculation (min)  55 min    Activity Tolerance  Patient tolerated treatment well    Behavior During Therapy  [facility] for tasks assessed/performed       Past Medical History:  Diagnosis Date  . ADHD    now off Adderal   . GERD (gastroesophageal reflux disease)     History reviewed. No pertinent surgical history.  There were no vitals filed for this visit.  Subjective Assessment - 07/12/19 0852    Subjective  Patient reports that she has had continued dizziness, reports that our last treatment one month ago helped some.  She reports that she went to the ED due to issues and that CT scans were negative.    Pain Score  3     Pain Location  Head    Aggravating Factors   unsure                       [facility] Adult PT Treatment/Exercise - 07/12/19 0001      Exercises   Exercises  Neck      Neck Exercises: Machines for Strengthening   Nustep  level 4 x 4 minutes      Neck Exercises: Theraband   Shoulder Extension  20 reps;Red    Rows  20 reps;Red    Shoulder External Rotation  20 reps;Red      Neck Exercises: Standing   Other Standing Exercises  5# shrugs with upper trap and levator stretches      Modalities   Modalities  Electrical Stimulation;Moist Heat      Moist Heat Therapy   Number Minutes Moist Heat  12 Minutes    Moist Heat Location  Cervical      Electrical Stimulation   Electrical Stimulation Location  cervical area    Electrical Stimulation Action   IFC    Electrical Stimulation Parameters  supine    Electrical Stimulation Goals  Pain      Manual Therapy   Manual Therapy  Neural Stretch;Soft tissue mobilization;Manual Traction    Manual therapy comments  used combo on the Upper trap     Soft tissue mobilization  to the right upper trap and into the right rhomboid    Manual Traction  occipital release    Neural Stretch  upper trap and levator stretch               PT Short Term Goals - 07/12/19 0927      PT SHORT TERM GOAL #1   Title  indepednent with initial HEP    Status  Achieved        PT Long Term Goals - 07/12/19 0927      PT LONG TERM GOAL #1   Title  report HA decreased 50%    Status  On-going      PT LONG TERM GOAL #2   Title  report dizziness decreased 50%    Status  On-going            Plan - 07/12/19 0925    Clinical Impression Statement  Patient continues to have knots in the upper traps, she did not want to try the DN yet, she is weak in the scapular area.  She does have a difficult time relaxing and allowing PROM and traction    PT Next Visit Plan  she is to see a neurologist soon, we will see if anything we can do to help    Consulted and Agree with Plan of Care  Patient       Patient will benefit from skilled therapeutic intervention in order to improve the following deficits and impairments:  Dizziness, Pain, Postural dysfunction, Decreased activity tolerance, Decreased range of motion  Visit Diagnosis: 1. Dizziness and giddiness   2. Acute intractable tension-type headache   3. Other muscle spasm        Problem List Patient Active Problem List   Diagnosis Date Noted  . Dizziness on standing 05/24/2019  . Rapid palpitations 05/24/2019  . Adjustment disorder with anxious mood 04/02/2019  . Initiation of oral contraception 09/21/2018    Sumner Boast., PT 07/12/2019, 9:28 AM  [facility]  [place], [place], [address] Phone: [phone]   Fax:  [phone]  Name: Melanie Jordan MRN: [id number] Date of Birth: [birth year]

## 2019-07-12 NOTE — Patient Instructions (Signed)

## 2019-07-19 ENCOUNTER — Other Ambulatory Visit: Payer: Self-pay | Admitting: Podiatry

## 2019-07-19 ENCOUNTER — Ambulatory Visit (INDEPENDENT_AMBULATORY_CARE_PROVIDER_SITE_OTHER): Payer: 59

## 2019-07-19 ENCOUNTER — Encounter: Payer: Self-pay | Admitting: Podiatry

## 2019-07-19 ENCOUNTER — Other Ambulatory Visit: Payer: Self-pay

## 2019-07-19 ENCOUNTER — Ambulatory Visit (INDEPENDENT_AMBULATORY_CARE_PROVIDER_SITE_OTHER): Payer: 59 | Admitting: Podiatry

## 2019-07-19 VITALS — BP 123/79 | HR 75 | Temp 97.4°F

## 2019-07-19 DIAGNOSIS — M7752 Other enthesopathy of left foot: Secondary | ICD-10-CM | POA: Diagnosis not present

## 2019-07-19 DIAGNOSIS — M67471 Ganglion, right ankle and foot: Secondary | ICD-10-CM

## 2019-07-19 DIAGNOSIS — M7750 Other enthesopathy of unspecified foot: Secondary | ICD-10-CM

## 2019-07-19 DIAGNOSIS — M7989 Other specified soft tissue disorders: Secondary | ICD-10-CM

## 2019-07-19 NOTE — Progress Notes (Signed)
   HPI: 28 y.o. female presenting today as a new patient for evaluation of a previously diagnosed ganglion cyst to the plantar aspect of the right first MTPJ.  Patient saw her PCP approximately 1-1/2 weeks ago and was told she had a cyst on the plantar aspect of the right forefoot.  She was referred here.  Patient also relates pain to the left great toe during ambulation.  Is been painful off and on for several months now.  She presents for further treatment and evaluation  Past Medical History:  Diagnosis Date  . ADHD    now off Adderal   . GERD (gastroesophageal reflux disease)      Physical Exam: General: The patient is alert and oriented x3 in no acute distress.  Dermatology: Skin is warm, dry and supple bilateral lower extremities. Negative for open lesions or macerations.  Vascular: Palpable pedal pulses bilaterally. No edema or erythema noted. Capillary refill within normal limits.  Neurological: Epicritic and protective threshold grossly intact bilaterally.   Musculoskeletal Exam: Range of motion within normal limits to all pedal and ankle joints bilateral. Muscle strength 5/5 in all groups bilateral.  There is a palpable fluctuant mass just proximal to the sesamoidal apparatus to the right foot consistent with a ganglion cyst.  It appears very superficial in nature embedded within the subcutaneous tissue.  Non-adhered.  There is also pain on palpation to the plantar aspect of the IPJ left great toe concerning for possible accessory sesamoid of the IPJ  Radiographic Exam:  Normal osseous mineralization. Joint spaces preserved. No fracture/dislocation/boney destruction.  Accessory sesamoid of the IPJ noted on radiographic exam left great toe.  Assessment: 1.  Ganglion cyst sub-first MTPJ right foot 2.  Hallux IPJ sesamoid causing inflammatory capsulitis left great toe   Plan of Care:  1. Patient evaluated. X-Rays reviewed.  2.  Concerning the hallux IPJ sesamoid I do recommend  good supportive shoe gear.  Patient is asymptomatic with good supportive shoes.  I do not recommend going barefoot 3.  Local anesthesia infiltration was utilized around the ganglion cyst of the plantar aspect of the right foot.  Toe was prepped in aseptic manner and the ganglion cyst was aspirated using an 18-gauge needle and syringe.  Approximately 1 mL of thick viscous serous fluid was drained from the underlying ganglion cyst.  Light dressing applied 4.  Explained to the patient that the ganglion cyst may recur.  Recommend good supportive shoes. 5.  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 Edrick Kins, DPM    [address] [place], [place] [address]                Office [phone]  Fax [phone]

## 2019-07-21 ENCOUNTER — Other Ambulatory Visit: Payer: Self-pay | Admitting: Nurse Practitioner

## 2019-07-21 DIAGNOSIS — Z30011 Encounter for initial prescription of contraceptive pills: Secondary | ICD-10-CM

## 2019-07-22 ENCOUNTER — Encounter: Payer: Self-pay | Admitting: Nurse Practitioner

## 2019-07-22 DIAGNOSIS — Z1283 Encounter for screening for malignant neoplasm of skin: Secondary | ICD-10-CM

## 2019-07-23 ENCOUNTER — Telehealth: Payer: Self-pay | Admitting: Nurse Practitioner

## 2019-07-23 NOTE — Telephone Encounter (Signed)
Dr. Loletha Grayer and Baldo Ash please advise.    Copied from [place] [phone] [phone]. Topic: Appointment Scheduling - Transfer of Care >> Jul 23, 2019  9:09 AM Reyne Dumas L wrote: Pt is requesting to transfer FROM: Wilfred Lacy Pt is requesting to transfer TO: Dr. Baker Janus Reason for requested transfer: prefers a doctor  Pt also wants to go ahead and schedule physical exam.  Send CRM to patient's current PCP (transferring FROM).

## 2019-07-23 NOTE — Telephone Encounter (Signed)
Admin team can you please help set this up

## 2019-07-23 NOTE — Telephone Encounter (Signed)
I called and left message on patient voicemail that TOC has been approved to see Dr. Bryan Lemma.

## 2019-07-23 NOTE — Telephone Encounter (Signed)
ok 

## 2019-07-23 NOTE — Telephone Encounter (Signed)
[place] with me but on review of pts chart, I don't think I have much different or additional to offer than what is currently being done

## 2019-07-27 DIAGNOSIS — E538 Deficiency of other specified B group vitamins: Secondary | ICD-10-CM | POA: Diagnosis not present

## 2019-07-27 DIAGNOSIS — E559 Vitamin D deficiency, unspecified: Secondary | ICD-10-CM | POA: Diagnosis not present

## 2019-07-27 DIAGNOSIS — G43109 Migraine with aura, not intractable, without status migrainosus: Secondary | ICD-10-CM | POA: Diagnosis not present

## 2019-07-28 ENCOUNTER — Ambulatory Visit (INDEPENDENT_AMBULATORY_CARE_PROVIDER_SITE_OTHER): Payer: 59 | Admitting: Licensed Clinical Social Worker

## 2019-07-28 ENCOUNTER — Other Ambulatory Visit: Payer: Self-pay

## 2019-07-28 ENCOUNTER — Encounter: Payer: Self-pay | Admitting: Family Medicine

## 2019-07-28 ENCOUNTER — Ambulatory Visit (INDEPENDENT_AMBULATORY_CARE_PROVIDER_SITE_OTHER): Payer: 59 | Admitting: Family Medicine

## 2019-07-28 VITALS — BP 126/74 | HR 83 | Temp 98.3°F | Ht 67.0 in | Wt 147.6 lb

## 2019-07-28 DIAGNOSIS — N644 Mastodynia: Secondary | ICD-10-CM

## 2019-07-28 DIAGNOSIS — F064 Anxiety disorder due to known physiological condition: Secondary | ICD-10-CM | POA: Diagnosis not present

## 2019-07-28 DIAGNOSIS — R0989 Other specified symptoms and signs involving the circulatory and respiratory systems: Secondary | ICD-10-CM

## 2019-07-28 DIAGNOSIS — F410 Panic disorder [episodic paroxysmal anxiety] without agoraphobia: Secondary | ICD-10-CM

## 2019-07-28 NOTE — Progress Notes (Signed)
Chief Complaint  Patient presents with  . Establish Care    HPI: Melanie Jordan is a 28 y.o. female who complains of  1. Rt breast "lump" and pain with palpation since this AM. Pt states she was doing regular self-breast exam and thought she felt a lump. She continued to press on the area and then notes discomfort in the area for about 20 min. It is now resolved. No redness or other skin changes. No nipple discharge. No fam h/o breast cancer. She has reduced her caffeine intake and is about 2 weeks from the start of her next period.  2. Pt feels her abdominal aortic pulse is "more noticeable" in the past 1-2 mo. She states she is aware of it all of the time. No pain. No fam h/o AAA or other aneurysm.  3. Pt saw neuro (with [facility]) and was diagnosed this AM with Vit B12 deficiency. She will be starting B12 injections. She was also started on nortriptyline for migraines and was told this may also help with her IBS.    Past Medical History:  Diagnosis Date  . ADHD    now off Adderal   . GERD (gastroesophageal reflux disease)     No past surgical history on file.  Social History   Socioeconomic History  . Marital status: Single    Spouse name: Not on file  . Number of children: 0  . Years of education: Not on file  . Highest education level: Associate degree: occupational, Hotel manager, or vocational program  Occupational History  . 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 Financial resource strain: Not on file  . Food insecurity    Worry: Not on file    Inability: Not on file  . Transportation needs    Medical: Not on file    Non-medical: Not on file  Tobacco Use  . Smoking status: Never Smoker  . Smokeless tobacco: Never Used  Substance and Sexual Activity  . Alcohol use: Yes    Comment: social  . Drug use: Never  . Sexual activity: Yes    Birth control/protection: Pill  Lifestyle  . Physical activity    Days per  week: Not on file    Minutes per session: Not on file  . Stress: Not on file  Relationships  . Social Herbalist on phone: Not on file    Gets together: Not on file    Attends religious service: Not on file    Active member of club or organization: Not on file    Attends meetings of clubs or organizations: Not on file    Relationship status: Not on file  . Intimate partner violence    Fear of current or ex partner: Not on file    Emotionally abused: Not on file    Physically abused: Not on file    Forced sexual activity: Not on file  Other Topics Concern  . Not on file  Social History Narrative   Engaged.      Started drinking more during the day - up to ~2 L (since onset of dizziness).      NO routine exercise.      Works in Rx Radiology @ [employer]    Family History  Problem Relation Age of Onset  . Cancer Mother 30       cervical and stomach cancer  . Fibroids Mother  uterine fibroid  . Thyroid disease Mother        hypothyroidism due to [place]  . Osteopenia Mother   . GER disease Mother   . Drug abuse Mother   . Asthma Brother   . Anxiety disorder Brother   . Arthritis Maternal Grandmother   . Depression Maternal Grandmother   . Heart disease Maternal Grandmother 63       CAD with stents  . Hyperlipidemia Maternal Grandmother   . Hypertension Maternal Grandmother   . Stroke Maternal Grandmother 72       TIA  . Cancer Paternal Grandmother 3       uterine cancer  . Asthma Brother   . Anxiety disorder Sister      Immunization History  Administered Date(s) Administered  . Hpv 05/26/2015  . Influenza,inj,Quad PF,6+ Mos 08/06/2018  . Tdap 05/26/2015    Outpatient Encounter Medications as of 07/28/2019  Medication Sig  . ALPRAZolam (XANAX) 0.25 MG tablet Take 1-2 tablets (0.25-0.5 mg total) by mouth at bedtime as needed for anxiety.  . Cetirizine HCl (ZYRTEC PO) Take by mouth.  Lenda Kelp FE 1/20 1-20 MG-MCG tablet TAKE 1 TABLET BY MOUTH EVERY  DAY  . nortriptyline (PAMELOR) 10 MG capsule Take by mouth.  . predniSONE (DELTASONE) 10 MG tablet Take 60mg  day 1, 50mg  day 2, 40mg  day 3, 30mg  day 4, 20mg  day 5, 10mg  day 6, then stop  . SUMAtriptan (IMITREX) 25 MG tablet Take 1 tablet (25 mg total) by mouth every 2 (two) hours as needed for migraine. No more than 100mg  in 24hrs  . ADDERALL XR 30 MG 24 hr capsule Take by mouth daily.  . Magnesium Citrate 125 MG CAPS Take 250 mg by mouth 2 (two) times daily.   . magnesium citrate SOLN Take 1 Bottle by mouth once.  . methocarbamol (ROBAXIN) 500 MG tablet Take 1 tablet (500 mg total) by mouth every 8 (eight) hours as needed for muscle spasms. (Patient not taking: Reported on 07/28/2019)  . omeprazole (PRILOSEC) 20 MG capsule TAKE 1 CAPSULE BY MOUTH EVERY DAY (Patient not taking: Reported on 07/28/2019)  . [DISCONTINUED] ibuprofen (ADVIL) 600 MG tablet Take 600 mg by mouth every 6 (six) hours as needed. headache   No facility-administered encounter medications on file as of 07/28/2019.      ROS: Pertinent positives and negatives noted in HPI. Remainder of ROS non-contributory    No Known Allergies  BP 126/74   Pulse 83   Temp 98.3 F (36.8 C) (Oral)   Ht 5\' 7"  (1.702 m)   Wt 147 lb 9.6 oz (67 kg)   SpO2 99%   BMI 23.12 kg/m   Physical Exam  Constitutional: She is oriented to person, place, and time. She appears well-developed and well-nourished. No distress.  Cardiovascular: Normal rate, regular rhythm and normal heart sounds.  Pulmonary/Chest: Effort normal and breath sounds normal. No respiratory distress. Right breast exhibits no mass, no nipple discharge, no skin change and no tenderness. Left breast exhibits no mass, no nipple discharge, no skin change and no tenderness.  Abdominal: Soft. Bowel sounds are normal. She exhibits no distension, no abdominal bruit, no pulsatile midline mass and no mass. There is no abdominal tenderness.  Neurological: She is alert and oriented to person,  place, and time.  Psychiatric: She has a normal mood and affect.     A/P:  1. Prominent abdominal aortic pulse - no bruit and no pulsatile mass on exam. Pt is concerned and  does feel her anxiety may be driving or contributing to her level of concern. Will check abd [place] to r/o AAA and provide pt reassurance and piece of mind - VAS [place] AAA DUPLEX; Future  2. Pain of right breast - pain resolved and no abnormalities on exam - provided reassurance and advised continued monthly self-breast exams - f/u PRN

## 2019-07-28 NOTE — Progress Notes (Signed)
Virtual Visit via Video Note  I connected with Melanie Jordan on 07/28/19 at  2:00 PM EDT by a video enabled telemedicine application and verified that I am speaking with the correct person using two identifiers.   I discussed the limitations of evaluation and management by telemedicine and the availability of in person appointments. The patient expressed understanding and agreed to proceed.   I discussed the assessment and treatment plan with the patient. The patient was provided an opportunity to ask questions and all were answered. The patient agreed with the plan and demonstrated an understanding of the instructions.   The patient was advised to call back or seek an in-person evaluation if the symptoms worsen or if the condition fails to improve as anticipated.  I provided 50 minutes of non-face-to-face time during this encounter.  THERAPIST PROGRESS NOTE  Session Time: 2:00 PM to 2:50 PM Participation Level: Active  Behavioral Response: CasualAlertAnxious  Type of Therapy: Individual Therapy  Treatment Goals addressed:  Enhance ability to handle effectively the full variety of life's anxieties, Interventions: CBT, DBT, Solution Focused, Strength-based, Supportive, Reframing and Other: coping  Summary: Melanie Jordan is a 28 y.o. female who presents with review of symptoms. She has had dizziness for 4 months. Went to a neurologist and just found out that her B12 low and starting treatment for that tomorrow. Thought that they have found a cause means it might be getting better and it helps. Shared history of seeking treatment that she went to ER due to being dizzy and chest pain. PCP thought it was anxiety and panic attack but patient says she knows what that is like and this is different. She had a panic attack at the start of these symptoms when she didn't know what this was, but her symptoms are different because they are all day, every day. Referred to physical therapy thought  it was her inner ear or vertigo. ENT completley ruled out to vertigo referral to neurologist. Having migtraines as a result of symptoms and why neurologist.  Neurologist has added nortriptyline for migraines and also may help her irritable bowel syndrome.   Anxiety symptoms have not completely decreased given her experience of not finding out what was going on so taking a wait and see approach. Treatment to start tomorrow. I had low anxiety all life not seen because not an issue until now. Think will not be an issue when this resolved. 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 Anxiety-4 out of 10 with 10 being the worst because nervous to start treatment. Differentiates this anxiety as "nervous kind of anxiety". Therapist elaborated because it is something new and unknown.  Worked on challenging cognitive distortion of worrying.  Patient relates that she is like this to worry about little things. For example  When MRI worried that her crown on her tooth shoot out and go through her brain. Irrational fears. Therapist pointed out the role of imagination in anxiety, and recognizing can help it not take over. Patient believes uncontrolled ADHD may be another reason for these type of irrational fears. On medications her heart was racing to 170 when sitting so stopped taking (went to a cardiologist) Taking Adderall. Possibly get back on. It was helpful for focus at work. Only work three days a week.  Reviewed CBT challenges to anxiety (see below) Patient shares  I should know how to think but can't. Therapist challenged her on should statements as putting high expectations on oneself and to change working to "I would like..." Describes anxiety in general as that her mind will fixate on one thing from the day and all the other worries are with it. But only for the day. If fFocus really hard can pull back  thoughts.  Discussed challenging modality to help get out of thoughts.  Started back with physical activity. Lower back and knee pain. Low impact. I haven't enjoyed going to work, helping people haven't been able to do this in confidence right now. Life not in line with values right now.  Suicidal/Homicidal: No  Therapist Response: Therapist assessed patient current functioning per report and process feelings related to current stressors.  Explored coping strategies for stressors.  Updated to patient's symptoms, focus of treatment significant changes in events and functioning as patient is a transfer from previous therapist.  Review gains from therapy that she has learned looking up symptoms extensively was not a good idea because of escalation of anxiety.   Focused on anxiety for tomorrow and beginning of procedure, reviewed cognitive distortions of anxiety including negative for casting, not believing 1 can cope and worked on challenging this with patient in session pointing out future has not happened so worrying about non-events.  Asked was the point of worrying about something that has not happened also utilize reframing to focus on positive of finally getting treatment for symptoms.  Reviewed escalation of symptoms of baseline low-grade anxiety with physical issues.   Discussed automatic thoughts that her thoughts are neurochemical signals and not facts so to challenged them for accuracy.  Explained nature of automatic thoughts that they happen so quickly that we often do not pay attention to them.the more we pay attention to more we are able to catch them and pay attention to what her thoughts are telling us and thus can better challenge for accuracy.  Reviewed self reflective skills as becoming more aware of our thoughts through practices such as mindfulness meditation, these practices help [place] to observe her thoughts that help [place] with better management of her thoughts.   Reviewed anxiety is fight  flight that helps [place] protect [place] from danger but does not always give [place] accurate information.  Helpful to pause a few seconds in our reaction to allow our smart brain to take over to help with the more effective reaction to events. We  can slow down our reaction by naming and identifying thoughts.  Looked up perspective of anxiety as false alarm often and work toward automatic reaction of not paying so much attention to false alarm, default setting of questioning for accuracy.  Using side-by-side comparison, million dollar question red pen at it, asking oneself with other parts are telling us, are curious part, are smart part. Reviewed distress tolerance that we can distract and come back to problem when less stressed. Reviewed in general focus of treatment interventions of emotional regulation skills to have better management of emotions. Provided strength based and supportive interventions.    Plan: Return again 4 weeks.2.Review ACT values. 3.Continue to work on DBT, relaxation, CBT for management of anxiety.   Diagnosis: Axis I: Anxiety disorder due to a medical condition with panic attacks    Axis II: No diagnosis    Cordella Register, LCSW 07/28/2019

## 2019-07-29 ENCOUNTER — Other Ambulatory Visit: Payer: Self-pay | Admitting: Nurse Practitioner

## 2019-07-29 DIAGNOSIS — R42 Dizziness and giddiness: Secondary | ICD-10-CM

## 2019-07-29 DIAGNOSIS — E538 Deficiency of other specified B group vitamins: Secondary | ICD-10-CM | POA: Diagnosis not present

## 2019-07-29 DIAGNOSIS — R519 Headache, unspecified: Secondary | ICD-10-CM

## 2019-07-29 NOTE — Telephone Encounter (Signed)
Nicole sent referral to a [facility] dermatology, per patient request to see a in network provider.

## 2019-07-29 NOTE — Telephone Encounter (Signed)
Please inquire with patient if this medication was helpful and if she requested for refill?

## 2019-07-29 NOTE — Telephone Encounter (Signed)
Pt stated that this was the pharmacy requesting this refill she does not need this prescription refill

## 2019-07-29 NOTE — Telephone Encounter (Signed)
Ok will refuse refill

## 2019-07-29 NOTE — Telephone Encounter (Signed)
Pt mentioned seeing neuro and being started on nortriptyline for migraines. Please call pt to see if sumatriptan was effective and if she vs pharm is requesting refill

## 2019-07-29 NOTE — Telephone Encounter (Signed)
Dr. Loletha Grayer please advise of medication refill

## 2019-07-30 ENCOUNTER — Telehealth (HOSPITAL_COMMUNITY): Payer: Self-pay | Admitting: Rehabilitation

## 2019-07-30 NOTE — Telephone Encounter (Signed)
Attempted to contact pt for scheduling ultrasound ordered by patient's PCP. The patient did not answer the call, so I left message requesting pt to call back to schedule an appt. [place]

## 2019-08-03 ENCOUNTER — Encounter: Payer: Self-pay | Admitting: Family Medicine

## 2019-08-03 ENCOUNTER — Emergency Department (HOSPITAL_BASED_OUTPATIENT_CLINIC_OR_DEPARTMENT_OTHER): Payer: 59

## 2019-08-03 ENCOUNTER — Emergency Department (HOSPITAL_BASED_OUTPATIENT_CLINIC_OR_DEPARTMENT_OTHER)
Admission: EM | Admit: 2019-08-03 | Discharge: 2019-08-03 | Disposition: A | Discharge status: Home / Self Care | Payer: 59 | Attending: Emergency Medicine | Admitting: Emergency Medicine

## 2019-08-03 ENCOUNTER — Ambulatory Visit: Payer: 59 | Admitting: Family Medicine

## 2019-08-03 ENCOUNTER — Encounter (HOSPITAL_BASED_OUTPATIENT_CLINIC_OR_DEPARTMENT_OTHER): Payer: Self-pay | Admitting: *Deleted

## 2019-08-03 ENCOUNTER — Other Ambulatory Visit: Payer: Self-pay

## 2019-08-03 DIAGNOSIS — Z87891 Personal history of nicotine dependence: Secondary | ICD-10-CM | POA: Insufficient documentation

## 2019-08-03 DIAGNOSIS — R0789 Other chest pain: Secondary | ICD-10-CM

## 2019-08-03 DIAGNOSIS — R51 Headache: Secondary | ICD-10-CM | POA: Insufficient documentation

## 2019-08-03 DIAGNOSIS — R531 Weakness: Secondary | ICD-10-CM | POA: Diagnosis not present

## 2019-08-03 DIAGNOSIS — R42 Dizziness and giddiness: Secondary | ICD-10-CM | POA: Diagnosis not present

## 2019-08-03 DIAGNOSIS — R000 Tachycardia, unspecified: Secondary | ICD-10-CM

## 2019-08-03 DIAGNOSIS — R5383 Other fatigue: Secondary | ICD-10-CM | POA: Diagnosis not present

## 2019-08-03 DIAGNOSIS — E876 Hypokalemia: Secondary | ICD-10-CM

## 2019-08-03 DIAGNOSIS — Z79899 Other long term (current) drug therapy: Secondary | ICD-10-CM | POA: Insufficient documentation

## 2019-08-03 DIAGNOSIS — R109 Unspecified abdominal pain: Secondary | ICD-10-CM | POA: Diagnosis not present

## 2019-08-03 DIAGNOSIS — R079 Chest pain, unspecified: Secondary | ICD-10-CM | POA: Diagnosis not present

## 2019-08-03 DIAGNOSIS — R110 Nausea: Secondary | ICD-10-CM | POA: Diagnosis not present

## 2019-08-03 LAB — CBC WITH DIFFERENTIAL/PLATELET
Abs Immature Granulocytes: 0.02 10*3/uL (ref 0.00–0.07)
Basophils Absolute: 0 10*3/uL (ref 0.0–0.1)
Basophils Relative: 0 %
Eosinophils Absolute: 0.1 10*3/uL (ref 0.0–0.5)
Eosinophils Relative: 1 %
HCT: 43.1 % (ref 36.0–46.0)
Hemoglobin: 14 g/dL (ref 12.0–15.0)
Immature Granulocytes: 0 %
Lymphocytes Relative: 38 %
Lymphs Abs: 2.6 10*3/uL (ref 0.7–4.0)
MCH: 30.5 pg (ref 26.0–34.0)
MCHC: 32.5 g/dL (ref 30.0–36.0)
MCV: 93.9 fL (ref 80.0–100.0)
Monocytes Absolute: 0.4 10*3/uL (ref 0.1–1.0)
Monocytes Relative: 6 %
Neutro Abs: 3.7 10*3/uL (ref 1.7–7.7)
Neutrophils Relative %: 55 %
Platelets: 281 10*3/uL (ref 150–400)
RBC: 4.59 MIL/uL (ref 3.87–5.11)
RDW: 12.2 % (ref 11.5–15.5)
WBC: 6.9 10*3/uL (ref 4.0–10.5)
nRBC: 0 % (ref 0.0–0.2)

## 2019-08-03 LAB — COMPREHENSIVE METABOLIC PANEL
ALT: 15 U/L (ref 0–44)
AST: 15 U/L (ref 15–41)
Albumin: 4.5 g/dL (ref 3.5–5.0)
Alkaline Phosphatase: 36 U/L — ABNORMAL LOW (ref 38–126)
Anion gap: 10 (ref 5–15)
BUN: 8 mg/dL (ref 6–20)
CO2: 24 mmol/L (ref 22–32)
Calcium: 9.1 mg/dL (ref 8.9–10.3)
Chloride: 102 mmol/L (ref 98–111)
Creatinine, Ser: 0.85 mg/dL (ref 0.44–1.00)
GFR calc Af Amer: >60 mL/min (ref >60)
GFR calc non Af Amer: >60 mL/min (ref >60)
Glucose, Bld: 98 mg/dL (ref 70–99)
Potassium: 3.1 mmol/L — ABNORMAL LOW (ref 3.5–5.1)
Sodium: 136 mmol/L (ref 135–145)
Total Bilirubin: 0.6 mg/dL (ref 0.3–1.2)
Total Protein: 7.6 g/dL (ref 6.5–8.1)

## 2019-08-03 LAB — D-DIMER, QUANTITATIVE: D-Dimer, Quant: <0.27 ug/mL-FEU (ref 0.00–0.50)

## 2019-08-03 LAB — TROPONIN I (HIGH SENSITIVITY)
Troponin I (High Sensitivity): <2 ng/L (ref <18)
Troponin I (High Sensitivity): 2 ng/L (ref <18)

## 2019-08-03 LAB — MAGNESIUM: Magnesium: 2.3 mg/dL (ref 1.7–2.4)

## 2019-08-03 MED ORDER — POTASSIUM CHLORIDE 10 MEQ/100ML IV SOLN
10.0000 meq | Freq: Once | INTRAVENOUS | Status: AC
  Administered 2019-08-03: 10 meq via INTRAVENOUS
  Filled 2019-08-03: qty 100

## 2019-08-03 MED ORDER — LACTATED RINGERS IV BOLUS
1000.0000 mL | Freq: Once | INTRAVENOUS | Status: AC
  Administered 2019-08-03: 1000 mL via INTRAVENOUS

## 2019-08-03 NOTE — ED Triage Notes (Signed)
Left chest pain radiates to her back just oppsite her left chest. She stated that she always have chest pain but it gets worst this past Friday

## 2019-08-03 NOTE — ED Provider Notes (Signed)
[facility]ER[facility]ENC[facility] DEPARTMENT Provider Note   CSN: [id number] Arrival date & time: 08/03/19  1051     History   Chief Complaint Chief Complaint  Patient presents with   Chest Pain    HPI Melanie Jordan is a 28 y.o. female.     Patient is a 28 year old female presenting today with multiple complaints.  Patient states that for the last 4 months she feels like her body is just falling apart.  She has had extreme fatigue, worsening headaches, feeling dizzy and lightheaded that require her to sit down because she feels like her heart is racing and she may pass out and has also had some intermittent chest pain.  She is followed up with her regular doctor, cardiology and neurology.  She was diagnosed with vitamin B12 deficiency which is started receiving treatment for that but also wore a heart monitor that showed evidence of sinus tachycardia but no other acute dysrhythmia.  Neurology also started her on nortriptyline and she took a prednisone taper for the steroids.  She has had several MRIs in the last few months which have all been negative.  She does not have a history of tobacco use, drug use and only heart history is of a grandmother diagnosed later in life with coronary artery disease.  Patient denies any supplemental use other than B12 and D3 which were prescribed by her doctor.  She does take oral contraceptives and has not had change in her menses.  She does have a history of anxiety and has been seeing a counselor which she feels is helping.  Other than the pandemic she has had no other stressful life events.  The history is provided by the patient.  Chest Pain Pain location:  L chest Pain quality: aching and shooting   Pain radiates to:  L shoulder and upper back Pain severity:  Moderate Onset quality:  Gradual Duration: Has been present intermittently for 4 months but worse in the last 1 week. Timing:  Intermittent Progression:  Waxing and waning Chronicity:   Recurrent Context comment:  She does feel like it is worse when she is up and moving around Relieved by:  Nothing Ineffective treatments:  Rest   Past Medical History:  Diagnosis Date   ADHD    now off Adderal    GERD (gastroesophageal reflux disease)     Patient Active Problem List   Diagnosis Date Noted   Dizziness on standing 05/24/2019   Rapid palpitations 05/24/2019   Adjustment disorder with anxious mood 04/02/2019   Initiation of oral contraception 09/21/2018    History reviewed. No pertinent surgical history.   OB History   No obstetric history on file.      Home Medications    Prior to Admission medications   Medication Sig Start Date End Date Taking? Authorizing Provider  ADDERALL XR 30 MG 24 hr capsule Take by mouth daily. 05/25/19   [provider]  ALPRAZolam Duanne Moron) 0.25 MG tablet Take 1-2 tablets (0.25-0.5 mg total) by mouth at bedtime as needed for anxiety. 04/13/19   Nche, Charlene Brooke, NP  Cetirizine HCl (ZYRTEC PO) Take by mouth.    [provider]  JUNEL FE 1/20 1-20 MG-MCG tablet TAKE 1 TABLET BY MOUTH EVERY DAY 07/21/19   Nche, Charlene Brooke, NP  Magnesium Citrate 125 MG CAPS Take 250 mg by mouth 2 (two) times daily.     [provider]  magnesium citrate SOLN Take 1 Bottle by mouth once.  [provider]  methocarbamol (ROBAXIN) 500 MG tablet Take 1 tablet (500 mg total) by mouth every 8 (eight) hours as needed for muscle spasms. Patient not taking: Reported on 07/28/2019 06/15/19   Nche, Charlene Brooke, NP  nortriptyline (PAMELOR) 10 MG capsule Take by mouth. 07/27/19 08/26/19  [provider]  omeprazole (PRILOSEC) 20 MG capsule TAKE 1 CAPSULE BY MOUTH EVERY DAY Patient not taking: Reported on 07/28/2019 06/14/19   Nche, Charlene Brooke, NP  predniSONE (DELTASONE) 10 MG tablet Take 60mg  day 1, 50mg  day 2, 40mg  day 3, 30mg  day 4, 20mg  day 5, 10mg  day 6, then stop 07/27/19   [provider]  SUMAtriptan  (IMITREX) 25 MG tablet Take 1 tablet (25 mg total) by mouth every 2 (two) hours as needed for migraine. No more than 100mg  in 24hrs 07/06/19   Nche, Charlene Brooke, NP    Family History Family History  Problem Relation Age of Onset   Cancer Mother 61       cervical and stomach cancer   Fibroids Mother        uterine fibroid   Thyroid disease Mother        hypothyroidism due to Independence   Osteopenia Mother    GER disease Mother    Drug abuse Mother    Asthma Brother    Anxiety disorder Brother    Arthritis Maternal Grandmother    Depression Maternal Grandmother    Heart disease Maternal Grandmother 40       CAD with stents   Hyperlipidemia Maternal Grandmother    Hypertension Maternal Grandmother    Stroke Maternal Grandmother 72       TIA   Cancer Paternal Grandmother 80       uterine cancer   Asthma Brother    Anxiety disorder Sister     Social History Social History   Tobacco Use   Smoking status: Never Smoker   Smokeless tobacco: Never Used  Substance Use Topics   Alcohol use: Yes    Comment: social   Drug use: Never     Allergies   Patient has no known allergies.   Review of Systems Review of Systems  Cardiovascular: Positive for chest pain.  Gastrointestinal:       For the last several months she has had ongoing abdominal discomfort and nausea that is continuing to worsen  Neurological:       Feelings of lightheadedness, dizziness, facial pain and headaches almost daily  All other systems reviewed and are negative.    Physical Exam Updated Vital Signs BP (!) 138/97 (BP Location: Right Arm)    Pulse (!) 101    Temp 98.2 F (36.8 C) (Oral)    Resp 14    Ht 5\' 7"  (1.702 m)    Wt 67.4 kg    LMP 07/11/2019    SpO2 100%    BMI 23.29 kg/m   Physical Exam Vitals signs and nursing note reviewed.  Constitutional:      General: She is not in acute distress.    Appearance: She is well-developed and normal weight.     Comments: Patient  is tearful on exam but appropriate  HENT:     Head: Normocephalic and atraumatic.     Mouth/Throat:     Mouth: Mucous membranes are moist.  Eyes:     Pupils: Pupils are equal, round, and reactive to light.  Cardiovascular:     Rate and Rhythm: Regular rhythm. Tachycardia present.  Pulses: Normal pulses.     Heart sounds: Normal heart sounds. No murmur. No friction rub.     Comments: Patient's heart rate will go from 102 up to 140 when she gets upset Pulmonary:     Effort: Pulmonary effort is normal.     Breath sounds: Normal breath sounds. No wheezing or rales.  Abdominal:     General: Bowel sounds are normal. There is no distension.     Palpations: Abdomen is soft.     Tenderness: There is no abdominal tenderness. There is no guarding or rebound.  Musculoskeletal: Normal range of motion.        General: No tenderness.     Right lower leg: No edema.     Left lower leg: No edema.     Comments: No edema  Skin:    General: Skin is warm and dry.     Findings: No rash.  Neurological:     Mental Status: She is alert and oriented to person, place, and time.     Cranial Nerves: No cranial nerve deficit.  Psychiatric:        Mood and Affect: Affect is tearful.        Behavior: Behavior normal.      ED Treatments / Results  Labs (all labs ordered are listed, but only abnormal results are displayed) Labs Reviewed  COMPREHENSIVE METABOLIC PANEL - Abnormal; Notable for the following components:      Result Value   Potassium 3.1 (*)    Alkaline Phosphatase 36 (*)    All other components within normal limits  CBC WITH DIFFERENTIAL/PLATELET  D-DIMER, QUANTITATIVE (NOT AT [facility])  MAGNESIUM  TROPONIN I (HIGH SENSITIVITY)  TROPONIN I (HIGH SENSITIVITY)    EKG EKG Interpretation  Date/Time:  Tuesday August 03 2019 11:03:31 EDT Ventricular Rate:  94 PR Interval:    QRS Duration: 87 QT Interval:  362 QTC Calculation: 453 R Axis:   98 Text Interpretation:  Sinus rhythm  Consider right atrial enlargement Borderline right axis deviation Borderline T abnormalities, inferior leads Baseline wander in lead(s) V3 similar to EKG 03/2019 Confirmed by Blanchie Dessert [id number] on 08/03/2019 11:16:32 AM   Radiology Dg Chest Port 1 View  Result Date: 08/03/2019 CLINICAL DATA:  Chest pain EXAM: PORTABLE CHEST 1 VIEW COMPARISON:  03/27/2019 FINDINGS: The heart size and mediastinal contours are within normal limits. Both lungs are clear. The visualized skeletal structures are unremarkable. IMPRESSION: No acute abnormality of the lungs in AP portable projection. Electronically Signed   By: Eddie Candle M.D.   On: 08/03/2019 11:26    Procedures Procedures (including critical care time)  Medications Ordered in ED Medications  lactated ringers bolus 1,000 mL (has no administration in time range)     Initial Impression / Assessment and Plan / ED Course  I have reviewed the triage vital signs and the nursing notes.  Pertinent labs & imaging results that were available during my care of the patient were reviewed by me and considered in my medical decision making (see chart for details).        Patient is a 28 year old female presenting today with multiple vague complaints of generalized fatigue, weakness, chest pain in the left chest into her shoulder, ongoing nausea, headaches and abdominal discomfort.  She also complains of feeling dizzy with any prolonged standing like she may pass out.  Patient has seen neurology and cardiology for these symptoms.  At the time she saw cardiology she wore a monitor for a  month without significant dysrhythmia but did show sinus tachycardia.  Thought was a possible pots but she did not meet criteria when she was seen in the office.  Patient also was diagnosed with B12 deficiency and has started replacement and has also started nortriptyline for headaches which she does not feel is helping.  She feels like she cannot get a deep breath but denies  shortness of breath.  She has no infectious symptoms and is in no acute distress on exam.  Patient does get extremely tachycardic when she gets upset with heart rates going up to 140 but then quickly improving to the high 90s.  She does take OCPs but is otherwise low risk Wells criteria.  Low suspicion that this is ACS.  EKG does show some T wave inversion which was present on her EKG in May as well.  She does have prior history of having mild hypokalemia of 3.2 we will repeat that today to ensure no recurrent hypokalemia.  Patient will be given a fluid bolus.  Labs and x-ray are pending.  1:22 PM Patient's potassium again is low at 3.1 today from unknown cause.  She does not suffer from bulimia or anorexia.  She is eating regularly and does not use alcohol.  Will check magnesium to ensure that is not low as well.  After IV fluids patient's tachycardia has significantly improved.  She is generally feeling better.  Patient was given 1 round of IV potassium.  Feel that she is stable for discharge at this time but encouraged to follow-up with neurology and cardiology in the future. Magnesium is wnl.   Final Clinical Impressions(s) / ED Diagnoses   Final diagnoses:  Atypical chest pain  Hypokalemia  Sinus tachycardia    ED Discharge Orders    None       Blanchie Dessert, MD 08/03/19 [phone]

## 2019-08-03 NOTE — ED Notes (Signed)
Patient stated that she still feel dizzy when she lay down and felt like a swimming head when she ambulates.

## 2019-08-04 ENCOUNTER — Ambulatory Visit: Payer: 59 | Admitting: Nurse Practitioner

## 2019-08-04 ENCOUNTER — Ambulatory Visit (HOSPITAL_COMMUNITY)
Admission: RE | Admit: 2019-08-04 | Discharge: 2019-08-04 | Disposition: A | Discharge status: Home / Self Care | Payer: 59 | Source: Ambulatory Visit | Attending: Family Medicine | Admitting: Family Medicine

## 2019-08-04 DIAGNOSIS — R0989 Other specified symptoms and signs involving the circulatory and respiratory systems: Secondary | ICD-10-CM | POA: Insufficient documentation

## 2019-08-05 DIAGNOSIS — E876 Hypokalemia: Secondary | ICD-10-CM | POA: Diagnosis not present

## 2019-08-05 DIAGNOSIS — G43109 Migraine with aura, not intractable, without status migrainosus: Secondary | ICD-10-CM | POA: Diagnosis not present

## 2019-08-05 DIAGNOSIS — Z9109 Other allergy status, other than to drugs and biological substances: Secondary | ICD-10-CM | POA: Insufficient documentation

## 2019-08-05 DIAGNOSIS — F419 Anxiety disorder, unspecified: Secondary | ICD-10-CM | POA: Insufficient documentation

## 2019-08-05 DIAGNOSIS — R102 Pelvic and perineal pain: Secondary | ICD-10-CM | POA: Diagnosis not present

## 2019-08-05 DIAGNOSIS — E538 Deficiency of other specified B group vitamins: Secondary | ICD-10-CM | POA: Diagnosis not present

## 2019-08-05 NOTE — Telephone Encounter (Signed)
Vascular US of aorta is normal.  No sign of a aneurysm

## 2019-08-06 DIAGNOSIS — R102 Pelvic and perineal pain: Secondary | ICD-10-CM | POA: Diagnosis not present

## 2019-08-09 ENCOUNTER — Encounter: Payer: Self-pay | Admitting: Cardiology

## 2019-08-09 ENCOUNTER — Other Ambulatory Visit: Payer: Self-pay

## 2019-08-09 ENCOUNTER — Ambulatory Visit (INDEPENDENT_AMBULATORY_CARE_PROVIDER_SITE_OTHER): Payer: 59 | Admitting: Cardiology

## 2019-08-09 VITALS — BP 119/81 | HR 81 | Temp 98.1°F | Ht 67.0 in | Wt 151.4 lb

## 2019-08-09 DIAGNOSIS — R0789 Other chest pain: Secondary | ICD-10-CM | POA: Diagnosis not present

## 2019-08-09 DIAGNOSIS — R002 Palpitations: Secondary | ICD-10-CM | POA: Diagnosis not present

## 2019-08-09 DIAGNOSIS — R42 Dizziness and giddiness: Secondary | ICD-10-CM | POA: Diagnosis not present

## 2019-08-09 NOTE — Patient Instructions (Signed)
Medication Instructions:  MAY USE TYLENOL OR IBUPROFEN  FOR YOUR CHEST PAIN   If you need a refill on your cardiac medications before your next appointment, please call your pharmacy.   Lab work:  NOT NEEDED  Testing/Procedures: NOT NEEDED  Follow-Up: At [facility], you and your health needs are our priority.  As part of our continuing mission to provide you with exceptional heart care, we have created designated Provider Care Teams.  These Care Teams include your primary Cardiologist (physician) and Advanced Practice Providers (APPs -  Physician Assistants and Nurse Practitioners) who all work together to provide you with the care you need, when you need it. . You will need a follow up appointment in  6 months.  Please call our office 2 months in advance to schedule this appointment.  You may see [name], MD or one of the following Advanced Practice Providers on your designated Care Team:   . Rosaria Ferries, PA-C . Jory Sims, DNP, ANP  Any Other Special Instructions Will Be Listed Below (If Applicable).   TRY TO KEEP HYDRATED  EAT BANANAS  AS WELL

## 2019-08-09 NOTE — Progress Notes (Signed)
PCP: Ronnald Nian, DO  Clinic Note: Chief Complaint  Patient presents with  . Follow-up    Results of monitor  . Palpitations  . Chest Pain    HPI: Melanie Jordan is a 28 y.o. female who is being seen today for follow-up evaluation of rapid HR / near syncope & chest tightness.  Here to follow-up results of studies.  Initially seen at the request of Nche, Charlene Brooke, NP in response to ER visit back in May.Melanie Jordan is a very pleasant young woman who recently moved to [place] with her fianc, and works as an Engineering geologist at [employer], but lives in [place].  Melanie Jordan was seen for initial consultation on June 29.  We were going to evaluate her dizziness palpitations with an event monitor.  Recent Hospitalizations:  She had another ER visit with chest pain on 08/03/2019 --indicated that her "body must just be falling apart.  Noting fatigue headaches dizziness lightheadedness.  Heart racing as though she may pass out.  Chest pain.  She was told that her symptoms are related to low potassium (but was only roughly 3.1)  Studies Personally Reviewed - (if available, images/films reviewed: From Epic Chart or Care Everywhere)  Cardiac event monitor August 2020: Mostly sinus rhythm, average heart rate 75 bpm.  Minimum heart rate 43 bpm, maximum 1 6 6  bpm in sinus tachycardia.  Rare <1% PACs or PVCs.  No sustained arrhythmias.  Symptoms noted with mostly sinus rhythm with occasional PVCs.  Interval History: Melanie Jordan returns here today shy and timid.  She says that she has been diagnosed with vestibular migraines which does explain some of her dizziness.  She says that she still has palpitations off and on but they are little bit better.  The chest discomfort she described to me is actually in the left upper chest along her sternum then also underneath her breast.  Is been off and on for about 2 3days worse with certain movements.  Short episodes of  stabbing discomfort.  Worse with deep inspiration.  She has had dizziness but is not really associated with the palpitations.  The discomfort in her chest is not exertional, usually happening at rest or when she lies down.  She denies any PND or orthopnea.  Although she has dizziness there is no syncope or near syncope.  No TIA or amaurosis fugax.   ROS: A comprehensive was performed -pertinent details noted above. Review of Systems  Constitutional: Positive for diaphoresis (per HPI). Negative for malaise/fatigue and weight loss.  HENT: Negative for congestion and nosebleeds.   Respiratory: Negative.   Cardiovascular: Negative for claudication.  Gastrointestinal: Positive for nausea (Only on occasion if she has had a particular bad spell of dizziness). Negative for abdominal pain, blood in stool, constipation, heartburn and melena.  Genitourinary: Negative for hematuria.  Musculoskeletal: Negative for joint pain.  Skin: Negative.   Neurological: Positive for dizziness (per HPI), tingling (Tingling in her hands with anxiety) and headaches (Recent diagnosis of vestibular migraines). Negative for focal weakness and weakness.  Psychiatric/Behavioral: The patient is nervous/anxious (she has had panic attacks - but current Sx are not similar) and has insomnia.    The patient does not have symptoms concerning for COVID-19 infection (fever, chills, cough, or new shortness of breath).  The patient is practicing social distancing.   COVID-19 Education: The signs and symptoms of COVID-19 were discussed with the patient and how to seek care for testing (follow  up with PCP or arrange E-visit).   The importance of social distancing was discussed today.   I have reviewed and (if needed) personally updated the patient's problem list, medications, allergies, past medical and surgical history, social and family history.   Past Medical History:  Diagnosis Date  . ADHD    now off Adderal   . GERD  (gastroesophageal reflux disease)     History reviewed. No pertinent surgical history.  Current Meds  Medication Sig  . ALPRAZolam (XANAX) 0.25 MG tablet Take 1-2 tablets (0.25-0.5 mg total) by mouth at bedtime as needed for anxiety.  . Cetirizine HCl (ZYRTEC PO) Take by mouth.  . Cholecalciferol (VITAMIN D3) 25 MCG (1000 UT) CAPS Take 1 capsule by mouth daily.  Lenda Kelp FE 1/20 1-20 MG-MCG tablet TAKE 1 TABLET BY MOUTH EVERY DAY  . nortriptyline (PAMELOR) 10 MG capsule Take by mouth.  . SUMAtriptan (IMITREX) 25 MG tablet Take 1 tablet (25 mg total) by mouth every 2 (two) hours as needed for migraine. No more than 100mg  in 24hrs  . vitamin B-12 (CYANOCOBALAMIN) 1000 MCG tablet Take 1,000 mcg by mouth daily.    No Known Allergies  Social History   Tobacco Use  . Smoking status: Never Smoker  . Smokeless tobacco: Never Used  Substance Use Topics  . Alcohol use: Yes    Comment: social  . Drug use: Never   Social History   Social History Narrative   Engaged.      Started drinking more during the day - up to ~2 L (since onset of dizziness).      NO routine exercise.      Works in [employer] @ [employer]    family history includes Anxiety disorder in her brother and sister; Arthritis in her maternal grandmother; Asthma in her brother and brother; Cancer (age of onset: 81) in her mother; Cancer (age of onset: 42) in her paternal grandmother; Depression in her maternal grandmother; Drug abuse in her mother; Fibroids in her mother; GER disease in her mother; Heart disease (age of onset: 10) in her maternal grandmother; Hyperlipidemia in her maternal grandmother; Hypertension in her maternal grandmother; Osteopenia in her mother; Stroke (age of onset: 13) in her maternal grandmother; Thyroid disease in her mother.  Wt Readings from Last 3 Encounters:  08/09/19 151 lb 6.4 oz (68.7 kg)  08/03/19 148 lb 11.2 oz (67.4 kg)  07/28/19 147 lb 9.6 oz (67 kg)    PHYSICAL EXAM BP 119/81    Pulse 81   Temp 98.1 F (36.7 C)   Ht 5\' 7"  (1.702 m)   Wt 151 lb 6.4 oz (68.7 kg)   LMP 07/11/2019   SpO2 100%   BMI 23.71 kg/m   Orthostatics: non-significant Physical Exam  Constitutional: She is oriented to person, place, and time. She appears well-developed and well-nourished. No distress.  Healthy-appearing.  Pleasant young woman.  HENT:  Head: Normocephalic and atraumatic.  Mouth/Throat: Oropharynx is clear and moist.  Neck: Normal range of motion. Neck supple. No hepatojugular reflux and no JVD present. Carotid bruit is not present.  Cardiovascular: Normal rate, regular rhythm, normal heart sounds and intact distal pulses.  No extrasystoles are present. PMI is not displaced. Exam reveals no gallop and no friction rub.  No murmur heard. Pulmonary/Chest: Effort normal and breath sounds normal. No respiratory distress. She has no wheezes. She has no rales.  Musculoskeletal: Normal range of motion.        General: No edema.  Neurological: She is alert and oriented to person, place, and time. She has normal reflexes.  Skin: She is not diaphoretic.  Psychiatric: She has a normal mood and affect. Her behavior is normal. Judgment and thought content normal.  Vitals reviewed.    Adult ECG Report n/a   Other studies Reviewed: Additional studies/ records that were reviewed today include:  Recent Labs:   Lab Results  Component Value Date   CREATININE 0.85 08/03/2019   BUN 8 08/03/2019   NA 136 08/03/2019   K 3.1 (L) 08/03/2019   CL 102 08/03/2019   CO2 24 08/03/2019   Lab Results  Component Value Date   WBC 6.9 08/03/2019   HGB 14.0 08/03/2019   HCT 43.1 08/03/2019   MCV 93.9 08/03/2019   PLT 281 08/03/2019   Lab Results  Component Value Date   TSH 4.30 07/05/2019     ASSESSMENT / PLAN: Problem List Items Addressed This Visit    Anterior chest wall pain    Reproducible anterior left-sided chest discomfort along the second and third costosternal border.  Also  noted on the floating ribs in the costochondral joints.  I suspect that her symptoms are costochondritis.  Suggest using Tylenol or Motrin.  Also try to avoid sleeping on one side or the other.      Dizziness on standing    She probably could have some mild orthostatic type symptoms may be able to POTS type situation although it does meet criteria for POTS because of the average heart rate of 75 and occasional bradycardia.  Simply recommend maintaining adequate hydration      Rapid palpitations - Primary    Pretty much normal monitor.  No adverse findings.  I do not think the Adderall had anything to do with it. No SVT or PAT.  No arrhythmias.  Would not treat.  Mostly suggest to continue adequate hydration and liberalization of salt.  Also eating foods high in potassium such as potatoes tomatoes and bananas.        I spent a total of 20 min with the patient and chart review. >  50% of the time was spent in direct patient consultation.   Current medicines are reviewed at length with the patient today.  (+/- concerns) none The following changes have been made:  none  Patient Instructions  Medication Instructions:  MAY USE TYLENOL OR IBUPROFEN  FOR YOUR CHEST PAIN   If you need a refill on your cardiac medications before your next appointment, please call your pharmacy.   Lab work:  NOT NEEDED  Testing/Procedures: NOT NEEDED  Follow-Up: At [facility], you and your health needs are our priority.  As part of our continuing mission to provide you with exceptional heart care, we have created designated Provider Care Teams.  These Care Teams include your primary Cardiologist (physician) and Advanced Practice Providers (APPs -  Physician Assistants and Nurse Practitioners) who all work together to provide you with the care you need, when you need it. . You will need a follow up appointment in  6 months.  Please call our office 2 months in advance to schedule this appointment.  You  may see [name], MD or one of the following Advanced Practice Providers on your designated Care Team:   . Rosaria Ferries, PA-C . Jory Sims, DNP, ANP  Any Other Special Instructions Will Be Listed Below (If Applicable).   TRY TO KEEP HYDRATED  EAT BANANAS  AS WELL    Studies  Ordered:   No orders of the defined types were placed in this encounter.     [name], M.D., M.S. Interventional Cardiologist   Pager # [phone] Phone # [phone] [address] [place], [place] [address]   Thank you for choosing [facility] at [facility]!!

## 2019-08-10 ENCOUNTER — Encounter: Payer: Self-pay | Admitting: Gastroenterology

## 2019-08-10 DIAGNOSIS — J305 Allergic rhinitis due to food: Secondary | ICD-10-CM | POA: Diagnosis not present

## 2019-08-11 ENCOUNTER — Encounter: Payer: Self-pay | Admitting: Family Medicine

## 2019-08-11 ENCOUNTER — Ambulatory Visit: Payer: 59 | Admitting: Family Medicine

## 2019-08-12 ENCOUNTER — Inpatient Hospital Stay: Payer: 59 | Admitting: Family Medicine

## 2019-08-12 ENCOUNTER — Encounter: Payer: Self-pay | Admitting: Cardiology

## 2019-08-12 DIAGNOSIS — R0789 Other chest pain: Secondary | ICD-10-CM | POA: Insufficient documentation

## 2019-08-12 DIAGNOSIS — E538 Deficiency of other specified B group vitamins: Secondary | ICD-10-CM | POA: Diagnosis not present

## 2019-08-12 NOTE — Assessment & Plan Note (Signed)
She probably could have some mild orthostatic type symptoms may be able to POTS type situation although it does meet criteria for POTS because of the average heart rate of 75 and occasional bradycardia.  Simply recommend maintaining adequate hydration

## 2019-08-12 NOTE — Assessment & Plan Note (Signed)
Pretty much normal monitor.  No adverse findings.  I do not think the Adderall had anything to do with it. No SVT or PAT.  No arrhythmias.  Would not treat.  Mostly suggest to continue adequate hydration and liberalization of salt.  Also eating foods high in potassium such as potatoes tomatoes and bananas.

## 2019-08-12 NOTE — Assessment & Plan Note (Signed)
Reproducible anterior left-sided chest discomfort along the second and third costosternal border.  Also noted on the floating ribs in the costochondral joints.  I suspect that her symptoms are costochondritis.  Suggest using Tylenol or Motrin.  Also try to avoid sleeping on one side or the other.

## 2019-08-16 ENCOUNTER — Ambulatory Visit: Payer: 59 | Admitting: Gastroenterology

## 2019-08-18 ENCOUNTER — Inpatient Hospital Stay: Payer: 59 | Admitting: Family Medicine

## 2019-08-19 ENCOUNTER — Encounter: Payer: Self-pay | Admitting: Family Medicine

## 2019-08-19 ENCOUNTER — Other Ambulatory Visit: Payer: Self-pay

## 2019-08-19 ENCOUNTER — Ambulatory Visit (INDEPENDENT_AMBULATORY_CARE_PROVIDER_SITE_OTHER): Payer: 59 | Admitting: Family Medicine

## 2019-08-19 VITALS — BP 118/78 | HR 64 | Temp 98.1°F | Ht 67.0 in | Wt 152.4 lb

## 2019-08-19 DIAGNOSIS — F54 Psychological and behavioral factors associated with disorders or diseases classified elsewhere: Secondary | ICD-10-CM

## 2019-08-19 DIAGNOSIS — E876 Hypokalemia: Secondary | ICD-10-CM | POA: Diagnosis not present

## 2019-08-19 DIAGNOSIS — F4322 Adjustment disorder with anxiety: Secondary | ICD-10-CM | POA: Diagnosis not present

## 2019-08-19 DIAGNOSIS — F419 Anxiety disorder, unspecified: Secondary | ICD-10-CM | POA: Diagnosis not present

## 2019-08-19 DIAGNOSIS — E538 Deficiency of other specified B group vitamins: Secondary | ICD-10-CM | POA: Diagnosis not present

## 2019-08-19 MED ORDER — ALPRAZOLAM 0.25 MG PO TABS: 0.2500 mg | ORAL_TABLET | Freq: Every evening | ORAL | 0 refills | Status: DC | PRN

## 2019-08-19 NOTE — Progress Notes (Signed)
Melanie Jordan is a 28 y.o. female  Chief Complaint  Patient presents with  . Hospitalization Follow-up    pt states she still "feels out of it"/ when she went to hospital she was weak/ dizzy/ pt states she felt like "her body was shutting down"    HPI: Melanie Jordan is a 28 y.o. female here to f/u on constellation of symptoms since 03/2019.  Has ophthalmology appt on Tues - vision changes, blurry vision; had eye exam a few months ago but vision has changed.  B12 injections - x 4 and now getting monthly x 4 Pt sleeps 7-9 hrs/night.  Pt still feels like she is lightheaded and "off", not dizzy. Symptoms worse later in the day. Symptoms worse on the days she works and later in her shifts.  Pt is eating regularly, stopped low FODMAP diet and feels this helped some. She drinks plenty of water.  Pt saw cardio on 9/14 and was told palpitations not cardiac in etiology. She endorses anxiety at bedtime and at times feels like "she has to remember to breathe or take deep breaths more often" Pt does not drink (used to drink socially) and denies illicit drug use. Symptoms since 03/2019. She has seen ENT, done PT, MRI brain, CTA head, saw cardio, neuro as well - all normal, negative.  She has been tried on buspar (dizzy, nausea), wellbutrin (crying all day), effexor (felt like a "zombie" and "scared")  Past Medical History:  Diagnosis Date  . ADHD    now off Adderal   . GERD (gastroesophageal reflux disease)     No past surgical history on file.  Social History   Socioeconomic History  . Marital status: Single    Spouse name: Not on file  . Number of children: 0  . Years of education: Not on file  . Highest education level: Associate degree: occupational, Hotel manager, or vocational program  Occupational History  . Occupation: Radiology Engineer, production: [employer]  Social Needs  . Financial resource strain: Not on file  . Food insecurity    Worry: Not on file     Inability: Not on file  . Transportation needs    Medical: Not on file    Non-medical: Not on file  Tobacco Use  . Smoking status: Never Smoker  . Smokeless tobacco: Never Used  Substance and Sexual Activity  . Alcohol use: Yes    Comment: social  . Drug use: Never  . Sexual activity: Yes    Birth control/protection: Pill  Lifestyle  . Physical activity    Days per week: Not on file    Minutes per session: Not on file  . Stress: Not on file  Relationships  . Social Herbalist on phone: Not on file    Gets together: Not on file    Attends religious service: Not on file    Active member of club or organization: Not on file    Attends meetings of clubs or organizations: Not on file    Relationship status: Not on file  . Intimate partner violence    Fear of current or ex partner: Not on file    Emotionally abused: Not on file    Physically abused: Not on file    Forced sexual activity: Not on file  Other Topics Concern  . Not on file  Social History Narrative   Engaged.      Started drinking  more during the day - up to ~2 L (since onset of dizziness).      NO routine exercise.      Works in [employer] Rad[employer] @ [employer]    Family History  Problem Relation Age of Onset  . Cancer Mother 30       cervical and stomach cancer  . Fibroids Mother        uterine fibroid  . Thyroid disease Mother        hypothyroidism due to [place]  . Osteopenia Mother   . GER disease Mother   . Drug abuse Mother   . Asthma Brother   . Anxiety disorder Brother   . Arthritis Maternal Grandmother   . Depression Maternal Grandmother   . Heart disease Maternal Grandmother 63       CAD with stents  . Hyperlipidemia Maternal Grandmother   . Hypertension Maternal Grandmother   . Stroke Maternal Grandmother 72       TIA  . Cancer Paternal Grandmother 65       uterine cancer  . Asthma Brother   . Anxiety disorder Sister      Immunization History  Administered Date(s)  Administered  . Hpv 05/26/2015  . Influenza,inj,Quad PF,6+ Mos 08/06/2018  . Influenza-Unspecified 08/15/2019  . Tdap 05/26/2015    Outpatient Encounter Medications as of 08/19/2019  Medication Sig  . ALPRAZolam (XANAX) 0.25 MG tablet Take 1-2 tablets (0.25-0.5 mg total) by mouth at bedtime as needed for anxiety.  . Cetirizine HCl (ZYRTEC PO) Take by mouth.  . Cholecalciferol (VITAMIN D3) 25 MCG (1000 UT) CAPS Take 1 capsule by mouth daily.  Lenda Kelp FE 1/20 1-20 MG-MCG tablet TAKE 1 TABLET BY MOUTH EVERY DAY  . nortriptyline (PAMELOR) 10 MG capsule Take by mouth.  . SUMAtriptan (IMITREX) 25 MG tablet Take 1 tablet (25 mg total) by mouth every 2 (two) hours as needed for migraine. No more than 100mg  in 24hrs  . vitamin B-12 (CYANOCOBALAMIN) 1000 MCG tablet Take 1,000 mcg by mouth daily.  . promethazine (PHENERGAN) 25 MG tablet TAKE 1 TABLET (25 MG TOTAL) BY MOUTH EVERY 8 (EIGHT) HOURS AS NEEDED FOR NAUSEA FOR UP TO 90 DAYS   No facility-administered encounter medications on file as of 08/19/2019.      ROS: Pertinent positives and negatives noted in HPI. Remainder of ROS non-contributory  No Known Allergies  BP 118/78   Pulse 64   Temp 98.1 F (36.7 C) (Oral)   Ht 5\' 7"  (1.702 m)   Wt 152 lb 6.4 oz (69.1 kg)   SpO2 100%   BMI 23.87 kg/m   Physical Exam  Constitutional: She is oriented to person, place, and time. She appears well-developed and well-nourished. No distress.  HENT:  Head: Normocephalic and atraumatic.  Right Ear: Tympanic membrane, external ear and ear canal normal.  Left Ear: Tympanic membrane, external ear and ear canal normal.  Nose: Right sinus exhibits no maxillary sinus tenderness and no frontal sinus tenderness. Left sinus exhibits no maxillary sinus tenderness and no frontal sinus tenderness.  Eyes: Pupils are equal, round, and reactive to light.  Cardiovascular: Normal rate, regular rhythm and normal heart sounds.  Pulmonary/Chest: Effort normal and  breath sounds normal.  Neurological: She is alert and oriented to person, place, and time.  Psychiatric: She has a normal mood and affect. Her speech is normal and behavior is normal. Thought content normal. Cognition and memory are normal.  Pt is quiet, shy     A/P:  1. Adjustment disorder with anxious mood 2. Anxiety 3. Psychosomatic factor in physical condition - pt has many physical symptoms and has seen cardio, ENT, neuro and had labs, EKG, MRI, CTA head - no acute findings. I do wonder if her symptoms are psychsomatic and or stress related. Pt tried 3 meds for anxiety but did not tolerate d/t side effects. She has taken xanax (a few times, last dose 1-2 mo ago) and does sleep well and feel better physically the following day. Pt will take xanax 0.25mg  1 tab qhs x 5 nights and keep a log of sleep, physical symptoms, anxiety, etc. Pt will f/u in 1 week.  - ALPRAZolam (XANAX) 0.25 MG tablet; Take 1 tablet (0.25 mg total) by mouth at bedtime as needed for anxiety.  Dispense: 7 tablet; Refill: 0  4. Hypokalemia - Potassium

## 2019-08-20 LAB — POTASSIUM: Potassium: 3.7 mEq/L (ref 3.5–5.1)

## 2019-08-24 DIAGNOSIS — H04123 Dry eye syndrome of bilateral lacrimal glands: Secondary | ICD-10-CM | POA: Diagnosis not present

## 2019-08-24 DIAGNOSIS — F4010 Social phobia, unspecified: Secondary | ICD-10-CM | POA: Diagnosis not present

## 2019-08-24 DIAGNOSIS — Z79899 Other long term (current) drug therapy: Secondary | ICD-10-CM | POA: Diagnosis not present

## 2019-08-24 DIAGNOSIS — F902 Attention-deficit hyperactivity disorder, combined type: Secondary | ICD-10-CM | POA: Diagnosis not present

## 2019-08-24 DIAGNOSIS — R51 Headache: Secondary | ICD-10-CM | POA: Diagnosis not present

## 2019-08-26 ENCOUNTER — Ambulatory Visit (HOSPITAL_COMMUNITY): Payer: 59 | Admitting: Licensed Clinical Social Worker

## 2019-08-27 ENCOUNTER — Encounter: Payer: Self-pay | Admitting: Family Medicine

## 2019-08-27 DIAGNOSIS — G43809 Other migraine, not intractable, without status migrainosus: Secondary | ICD-10-CM | POA: Diagnosis not present

## 2019-08-31 DIAGNOSIS — G43809 Other migraine, not intractable, without status migrainosus: Secondary | ICD-10-CM | POA: Diagnosis not present

## 2019-09-02 ENCOUNTER — Encounter: Payer: Self-pay | Admitting: Family Medicine

## 2019-09-04 DIAGNOSIS — G43809 Other migraine, not intractable, without status migrainosus: Secondary | ICD-10-CM | POA: Diagnosis not present

## 2019-09-06 ENCOUNTER — Encounter: Payer: Self-pay | Admitting: Family Medicine

## 2019-09-15 ENCOUNTER — Ambulatory Visit (HOSPITAL_COMMUNITY): Payer: 59 | Admitting: Licensed Clinical Social Worker

## 2019-09-15 ENCOUNTER — Encounter: Payer: Self-pay | Admitting: Family Medicine

## 2019-09-16 DIAGNOSIS — G43809 Other migraine, not intractable, without status migrainosus: Secondary | ICD-10-CM | POA: Diagnosis not present

## 2019-09-16 DIAGNOSIS — E538 Deficiency of other specified B group vitamins: Secondary | ICD-10-CM | POA: Diagnosis not present

## 2019-09-21 ENCOUNTER — Encounter: Payer: Self-pay | Admitting: Gastroenterology

## 2019-09-21 ENCOUNTER — Other Ambulatory Visit: Payer: Self-pay

## 2019-09-21 ENCOUNTER — Telehealth: Payer: Self-pay | Admitting: Gastroenterology

## 2019-09-21 ENCOUNTER — Ambulatory Visit (INDEPENDENT_AMBULATORY_CARE_PROVIDER_SITE_OTHER): Payer: 59 | Admitting: Gastroenterology

## 2019-09-21 VITALS — BP 100/78 | HR 86 | Temp 98.6°F | Ht 67.0 in | Wt 155.5 lb

## 2019-09-21 DIAGNOSIS — R140 Abdominal distension (gaseous): Secondary | ICD-10-CM

## 2019-09-21 DIAGNOSIS — R1310 Dysphagia, unspecified: Secondary | ICD-10-CM

## 2019-09-21 DIAGNOSIS — K582 Mixed irritable bowel syndrome: Secondary | ICD-10-CM

## 2019-09-21 DIAGNOSIS — R0789 Other chest pain: Secondary | ICD-10-CM | POA: Diagnosis not present

## 2019-09-21 DIAGNOSIS — R1030 Lower abdominal pain, unspecified: Secondary | ICD-10-CM

## 2019-09-21 MED ORDER — HYOSCYAMINE SULFATE 0.125 MG SL SUBL: 0.1250 mg | SUBLINGUAL_TABLET | SUBLINGUAL | 3 refills | Status: DC | PRN

## 2019-09-21 NOTE — Telephone Encounter (Signed)
Instructions have been sent to patient via [place]. I have also called and left a message on the patients voicemail to notify her of instructions.

## 2019-09-21 NOTE — Progress Notes (Signed)
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  She states she has been having lower abdominal pain. Has been present on/off since at least 2014. Lasts a few minutes. No reliable provoking factors, but more typical when standing. No associated sxs.  Does not tend to have nocturnal symptoms.  Separately with long standing history of loose, non-bloody stools, then changed to constipation in 03/2019 for a few weeks, and now back to 1 formed stool/day now.  States change in bowel habits separate from the above abdominal pain.  No hematochezia. Does c/o bloating and visible distension. No change with low FODMAP diet. Does eat a lot of fast food daily.  Very rare vegetables.  Also c/o sharp, stabbing CP, occurring at random, but more common after eating. Independent of food types. Improved with Tums. No change with Prilosec.  Negative Cardiology evaluation to include 30-day monitor.  Diagnosed with costochondritis as well.  She also c/o intermittent dysphagia to solids and medications, pointing to sternal notch. No odynophagia. No food impactions.   Was seen in the GYN clinic in 07/2019 with chronic pelvic pain.  [place] n/f "no mass, but left adnexa difficult to visualize due to overlying bowel and distended appearing colon".  Overall normal GYN anatomy and referred to GI.  She states her pain was worse when pressing with US probe.   Was seen by her PCM, Dr. Letta Median, on 08/19/2019.  She was back to eating a normal diet (had stopped a low FODMAP diet) at that time.  Was seen in the ER  for chest pain on 08/03/2019.  Normal evaluation to include troponin, D-dimer, CBC, CMP.  H. pylori negative in 06/2019.  Normal ANA, ESR.  RUQ [place] (06/2019): Normal CT head (06/2019): Normal MRI brain (06/2019): Normal  Has had an extensive work-up recently, to include imaging as above, cardiology, neurology, ENT, PT referrals.  Has tried multiple medications to include BuSpar (dizzy, nausea), Wellbutrin (crying all day), Effexor (felt like a zombie and scared).  Started on Xanax trial x5 nights.  FHx: - Mother with mass in her stomach treated with surgery, radiation, chemotherapy   Past Medical History:  Diagnosis Date   ADHD    now off Adderal    Anxiety    Chronic head pain    GERD (gastroesophageal reflux disease)    IBS (irritable bowel syndrome)      History reviewed. No pertinent surgical history. Family History  Problem Relation Age of Onset   Cancer Mother 70       cervical and stomach cancer   Fibroids Mother        uterine fibroid   Thyroid disease Mother        hypothyroidism due to [place]   Osteopenia Mother    GER disease Mother    Drug abuse Mother    Asthma Brother    Anxiety disorder Brother    Irritable bowel syndrome Brother    Arthritis Maternal Grandmother    Depression Maternal Grandmother    Heart disease Maternal Grandmother 63       CAD  with stents   Hyperlipidemia Maternal Grandmother    Hypertension Maternal Grandmother    Stroke Maternal Grandmother 72       TIA   Cancer Paternal Grandmother 80       uterine cancer   Asthma Brother    Anxiety disorder Sister    Colon cancer Neg Hx    Esophageal cancer Neg Hx    Social History   Tobacco Use   Smoking status: Never Smoker   Smokeless tobacco: Never Used  Substance Use Topics   Alcohol use: Yes    Comment: social   Drug use: Never   Current Outpatient Medications  Medication Sig Dispense Refill   ALPRAZolam (XANAX) 0.25 MG tablet Take 1 tablet (0.25  mg total) by mouth at bedtime as needed for anxiety. 7 tablet 0   Cetirizine HCl (ZYRTEC PO) Take by mouth.     Cholecalciferol (VITAMIN D3) 25 MCG (1000 UT) CAPS Take 1 capsule by mouth daily.     JUNEL FE 1/20 1-20 MG-MCG tablet TAKE 1 TABLET BY MOUTH EVERY DAY 28 tablet 2   metoprolol succinate (TOPROL-XL) 50 MG 24 hr tablet Take 1 tablet by mouth at bedtime.     promethazine (PHENERGAN) 25 MG tablet TAKE 1 TABLET (25 MG TOTAL) BY MOUTH EVERY 8 (EIGHT) HOURS AS NEEDED FOR NAUSEA FOR UP TO 90 DAYS     SUMAtriptan (IMITREX) 25 MG tablet Take 1 tablet (25 mg total) by mouth every 2 (two) hours as needed for migraine. No more than 177m in 24hrs 10 tablet 0   vitamin B-12 (CYANOCOBALAMIN) 1000 MCG tablet Take 1,000 mcg by mouth daily.     No current facility-administered medications for this visit.    No Known Allergies   Review of Systems: All systems reviewed and negative except where noted in HPI.     Physical Exam:    Wt Readings from Last 3 Encounters:  09/21/19 155 lb 8 oz (70.5 kg)  08/19/19 152 lb 6.4 oz (69.1 kg)  08/09/19 151 lb 6.4 oz (68.7 kg)    BP 100/78    Pulse 86    Temp 98.6 F (37 C)    Ht 5' 7"  (1.702 m)    Wt 155 lb 8 oz (70.5 kg)    BMI 24.35 kg/m  Constitutional:  Pleasant, in no acute distress. Psychiatric: Normal mood and affect. Behavior is normal. EENT: Pupils normal.  Conjunctivae are normal. No scleral icterus. Neck supple. No cervical LAD. Cardiovascular: Normal rate, regular rhythm. No edema Pulmonary/chest: Effort normal and breath sounds normal. No wheezing, rales or rhonchi. Abdominal: Mild TTP in lower abdomen without rebound or guarding.  No peritoneal signs.  Soft, nondistended. Bowel sounds active throughout. There are no masses palpable. No hepatomegaly. Neurological: Alert and oriented to person place and time. Skin: Skin is warm and dry. No rashes noted.   ASSESSMENT AND PLAN;   1) Lower abdominal pain 2) IBS mixed type 3)  Bloating Clinical presentation consistent with IBS.  Discussed diagnosis at length today will proceed as below: -Levsin 0.125 mg prn 4-6 hours for pain -Discussed improvement in dietary habits (eats fast food daily) -Increase dietary fiber -Add fiber supplement -Discussed the role of colonoscopy, and she would like to hold off on this -Does have a history of migraines, so briefly discussed the possibility for intestinal migraine.  However, she is on multiple migraine medications, so low yield of adding new therapy trial  4) Dysphagia 5) Noncardiac chest pain -Discussed trial  of high-dose PPI.  No response to Prilosec in the past, so would like to proceed with endoscopic evaluation -EGD with possible dilation and gastric/duodenal biopsies  6) Abnormal ultrasound -Reviewed results from ultrasound by GYN.  Discussed role of colonoscopy, and as above, she would like to hold off at this juncture  7) Anxiety: -Briefly discussed overlap between anxiety and the above GI symptomatology.  Currently prescribed as needed BZD.  Depending on response to the above Levsin, could also consider trial of TCA in the future  8) Family history of GI malignancy: -Mother with possible stomach cancer, treated with surgical resection, chemotherapy, XRT.  We will certainly evaluate for GI pathology at time of EGD as above.  The indications, risks, and benefits of EGD were explained to the patient in detail. Risks include but are not limited to bleeding, perforation, adverse reaction to medications, and cardiopulmonary compromise. Sequelae include but are not limited to the possibility of surgery, hositalization, and mortality. The patient verbalized understanding and wished to proceed. All questions answered, referred to scheduler. Further recommendations pending results of the exam.     Melanie Bullion, DO, FACG  09/21/2019, 9:12 AM   Melanie Jordan, Garvin Fila, DO

## 2019-09-21 NOTE — Patient Instructions (Signed)
If you are age 26 or older, your body mass index should be between 23-30. Your Body mass index is 24.35 kg/m. If this is out of the aforementioned range listed, please consider follow up with your Primary Care Provider.  If you are age 69 or younger, your body mass index should be between 19-25. Your Body mass index is 24.35 kg/m. If this is out of the aformentioned range listed, please consider follow up with your Primary Care Provider.   To help prevent the possible spread of infection to our patients, communities, and staff; we will be implementing the following measures:  As of now we are not allowing any visitors/family members to accompany you to any upcoming appointments with [facility]. If you have any concerns about this please contact our office to discuss prior to the appointment.   We have sent the following medications to your pharmacy for you to pick up at your convenience: Levsin 0.125mg  every 4-6 hours as needed.  Please start taking a daily fiber supplement such as citrucel, benefiber, metamucil, or fiber choice.   It has been recommended to you by your physician that you have a(n) EGD completed. Per your request, we did not schedule the procedure(s) today. Please contact our office at [phone] should you decide to have the procedure completed. You will be scheduled for a pre-visit and procedure at that time.   It was a pleasure to see you today!  Vito Cirigliano, D.O.

## 2019-09-22 ENCOUNTER — Telehealth: Payer: Self-pay

## 2019-09-22 NOTE — Telephone Encounter (Signed)
Covid-19 screening questions   Do you now or have you had a fever in the last 14 days?  Do you have any respiratory symptoms of shortness of breath or cough now or in the last 14 days?  Do you have any family members or close contacts with diagnosed or suspected Covid-19 in the past 14 days?  Have you been tested for Covid-19 and found to be positive?       

## 2019-09-22 NOTE — Telephone Encounter (Signed)
Questions for Screening COVID-19  Symptom onset: N/a  Travel or Contacts: No  During this illness, did/does the patient experience any of the following symptoms? Fever >100.4F []  Yes [x]  No []  Unknown Subjective fever (felt feverish) []  Yes [x]  No []  Unknown Chills []  Yes [x]  No []  Unknown Muscle aches (myalgia) []  Yes [x]  No []  Unknown Runny nose (rhinorrhea) []  Yes [x]  No []  Unknown Sore throat []  Yes [x]  No []  Unknown Cough (new onset or worsening of chronic cough) []  Yes [x]  No []  Unknown Shortness of breath (dyspnea) []  Yes [x]  No []  Unknown Nausea or vomiting []  Yes [x]  No []  Unknown Headache []  Yes [x]  No []  Unknown Abdominal pain  []  Yes [x]  No []  Unknown Diarrhea (?3 loose/looser than normal stools/24hr period) []  Yes [x]  No []  Unknown  

## 2019-09-22 NOTE — Telephone Encounter (Signed)
Patient called back and answered "NO" to all screening questions. °

## 2019-09-23 ENCOUNTER — Other Ambulatory Visit: Payer: Self-pay | Admitting: Gastroenterology

## 2019-09-23 ENCOUNTER — Ambulatory Visit (INDEPENDENT_AMBULATORY_CARE_PROVIDER_SITE_OTHER): Payer: 59 | Admitting: Family Medicine

## 2019-09-23 ENCOUNTER — Encounter: Payer: Self-pay | Admitting: Family Medicine

## 2019-09-23 ENCOUNTER — Encounter: Payer: Self-pay | Admitting: Gastroenterology

## 2019-09-23 ENCOUNTER — Other Ambulatory Visit: Payer: Self-pay

## 2019-09-23 ENCOUNTER — Ambulatory Visit (AMBULATORY_SURGERY_CENTER): Payer: 59 | Admitting: Gastroenterology

## 2019-09-23 VITALS — BP 115/62 | HR 77 | Temp 98.8°F | Resp 15 | Ht 67.0 in | Wt 155.0 lb

## 2019-09-23 VITALS — BP 112/78 | HR 79 | Temp 98.4°F | Ht 67.0 in | Wt 156.4 lb

## 2019-09-23 DIAGNOSIS — R0789 Other chest pain: Secondary | ICD-10-CM | POA: Diagnosis not present

## 2019-09-23 DIAGNOSIS — F419 Anxiety disorder, unspecified: Secondary | ICD-10-CM

## 2019-09-23 DIAGNOSIS — K208 Other esophagitis without bleeding: Secondary | ICD-10-CM | POA: Diagnosis not present

## 2019-09-23 DIAGNOSIS — E876 Hypokalemia: Secondary | ICD-10-CM | POA: Diagnosis not present

## 2019-09-23 DIAGNOSIS — R1310 Dysphagia, unspecified: Secondary | ICD-10-CM | POA: Diagnosis not present

## 2019-09-23 DIAGNOSIS — K589 Irritable bowel syndrome without diarrhea: Secondary | ICD-10-CM | POA: Diagnosis not present

## 2019-09-23 DIAGNOSIS — Z0000 Encounter for general adult medical examination without abnormal findings: Secondary | ICD-10-CM

## 2019-09-23 DIAGNOSIS — K219 Gastro-esophageal reflux disease without esophagitis: Secondary | ICD-10-CM | POA: Diagnosis not present

## 2019-09-23 DIAGNOSIS — R519 Headache, unspecified: Secondary | ICD-10-CM

## 2019-09-23 DIAGNOSIS — G8929 Other chronic pain: Secondary | ICD-10-CM | POA: Diagnosis not present

## 2019-09-23 DIAGNOSIS — K2950 Unspecified chronic gastritis without bleeding: Secondary | ICD-10-CM | POA: Diagnosis not present

## 2019-09-23 DIAGNOSIS — E538 Deficiency of other specified B group vitamins: Secondary | ICD-10-CM | POA: Diagnosis not present

## 2019-09-23 DIAGNOSIS — E669 Obesity, unspecified: Secondary | ICD-10-CM | POA: Diagnosis not present

## 2019-09-23 DIAGNOSIS — N644 Mastodynia: Secondary | ICD-10-CM

## 2019-09-23 LAB — POTASSIUM: Potassium: 4.1 mEq/L (ref 3.5–5.1)

## 2019-09-23 LAB — MAGNESIUM: Magnesium: 2.2 mg/dL (ref 1.5–2.5)

## 2019-09-23 MED ORDER — SODIUM CHLORIDE 0.9 % IV SOLN: 500.0000 mL | INTRAVENOUS | Status: DC

## 2019-09-23 NOTE — Progress Notes (Signed)
Called to room to assist during endoscopic procedure.  Patient ID and intended procedure confirmed with present staff. Received instructions for my participation in the procedure from the performing physician.  

## 2019-09-23 NOTE — Progress Notes (Signed)
Report given to PACU, vss 

## 2019-09-23 NOTE — Patient Instructions (Signed)
Thank you for letting us take care of your healthcare needs today.  YOU HAD AN ENDOSCOPIC PROCEDURE TODAY AT THE  ENDOSCOPY CENTER:   Refer to the procedure report that was given to you for any specific questions about what was found during the examination.  If the procedure report does not answer your questions, please call your gastroenterologist to clarify.  If you requested that your care partner not be given the details of your procedure findings, then the procedure report has been included in a sealed envelope for you to review at your convenience later.  YOU SHOULD EXPECT: Some feelings of bloating in the abdomen. Passage of more gas than usual.  Walking can help get rid of the air that was put into your GI tract during the procedure and reduce the bloating. If you had a lower endoscopy (such as a colonoscopy or flexible sigmoidoscopy) you may notice spotting of blood in your stool or on the toilet paper. If you underwent a bowel prep for your procedure, you may not have a normal bowel movement for a few days.  Please Note:  You might notice some irritation and congestion in your nose or some drainage.  This is from the oxygen used during your procedure.  There is no need for concern and it should clear up in a day or so.  SYMPTOMS TO REPORT IMMEDIATELY:   Following upper endoscopy (EGD)  Vomiting of blood or coffee ground material  New chest pain or pain under the shoulder blades  Painful or persistently difficult swallowing  New shortness of breath  Fever of 100F or higher  Black, tarry-looking stools  For urgent or emergent issues, a gastroenterologist can be reached at any hour by calling [phone].   DIET:  We do recommend a small meal at first, but then you may proceed to your regular diet.  Drink plenty of fluids but you should avoid alcoholic beverages for 24 hours.  ACTIVITY:  You should plan to take it easy for the rest of today and you should NOT DRIVE or use  heavy machinery until tomorrow (because of the sedation medicines used during the test).    FOLLOW UP: Our staff will call the number listed on your records 48-72 hours following your procedure to check on you and address any questions or concerns that you may have regarding the information given to you following your procedure. If we do not reach you, we will leave a message.  We will attempt to reach you two times.  During this call, we will ask if you have developed any symptoms of COVID 19. If you develop any symptoms (ie: fever, flu-like symptoms, shortness of breath, cough etc.) before then, please call [phone].  If you test positive for Covid 19 in the 2 weeks post procedure, please call and report this information to us.    If any biopsies were taken you will be contacted by phone or by letter within the next 1-3 weeks.  Please call us at [phone] if you have not heard about the biopsies in 3 weeks.    SIGNATURES/CONFIDENTIALITY: You and/or your care partner have signed paperwork which will be entered into your electronic medical record.  These signatures attest to the fact that that the information above on your After Visit Summary has been reviewed and is understood.  Full responsibility of the confidentiality of this discharge information lies with you and/or your care-partner. 

## 2019-09-23 NOTE — Progress Notes (Signed)
Melanie Jordan is a 28 y.o. female  Chief Complaint  Patient presents with  . Establish Care    transfer of care.. cpe X fasting. numbness in left arm on and off for a month    HPI: Melanie Jordan is a 28 y.o. female here for annual CPE. She is fasting as she has an EGD this afternoon.   She complains of dry, irritated skin on dorsum of B/L wrists. This occurs around this time every year and is worsened by frequent use of hand sanitizer and hand washing. She has not tried anything to treat this. Pt has B12 deficiency and currently receiving B12 injections thru neurology. She has neuro f/u next week for migraines and intermittent numbness/tingling mostly in LUE.   Mother diagnosed with lymphoma 2 days ago.  Last PAP: 08/2018 - normal; pt is following with GYN  She has appt at [facility] next week with counselor and then following week with NP.  Past Medical History:  Diagnosis Date  . ADHD    now off Adderal   . Anxiety   . Chronic head pain   . GERD (gastroesophageal reflux disease)   . IBS (irritable bowel syndrome)     History reviewed. No pertinent surgical history.  Social History   Socioeconomic History  . Marital status: Single    Spouse name: Not on file  . Number of children: 0  . Years of education: Not on file  . Highest education level: Associate degree: occupational, Hotel manager, or vocational program  Occupational History  . 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 Financial resource strain: Not on file  . Food insecurity    Worry: Not on file    Inability: Not on file  . Transportation needs    Medical: Not on file    Non-medical: Not on file  Tobacco Use  . Smoking status: Never Smoker  . Smokeless tobacco: Never Used  Substance and Sexual Activity  . Alcohol use: Yes    Comment: social  . Drug use: Never  . Sexual activity: Yes    Birth control/protection: Pill  Lifestyle  . Physical  activity    Days per week: Not on file    Minutes per session: Not on file  . Stress: Not on file  Relationships  . Social Herbalist on phone: Not on file    Gets together: Not on file    Attends religious service: Not on file    Active member of club or organization: Not on file    Attends meetings of clubs or organizations: Not on file    Relationship status: Not on file  . Intimate partner violence    Fear of current or ex partner: Not on file    Emotionally abused: Not on file    Physically abused: Not on file    Forced sexual activity: Not on file  Other Topics Concern  . Not on file  Social History Narrative   Engaged.      Started drinking more during the day - up to ~2 L (since onset of dizziness).      NO routine exercise.      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 Cancer Mother 30       cervical and stomach cancer  . Fibroids Mother  uterine fibroid  . Thyroid disease Mother        hypothyroidism due to [place]  . Osteopenia Mother   . GER disease Mother   . Drug abuse Mother   . Asthma Brother   . Anxiety disorder Brother   . Irritable bowel syndrome Brother   . Arthritis Maternal Grandmother   . Depression Maternal Grandmother   . Heart disease Maternal Grandmother 63       CAD with stents  . Hyperlipidemia Maternal Grandmother   . Hypertension Maternal Grandmother   . Stroke Maternal Grandmother 72       TIA  . Cancer Paternal Grandmother 2       uterine cancer  . Asthma Brother   . Anxiety disorder Sister   . Colon cancer Neg Hx   . Esophageal cancer Neg Hx      Immunization History  Administered Date(s) Administered  . Hpv 05/26/2015  . Influenza,inj,Quad PF,6+ Mos 08/06/2018  . Influenza-Unspecified 08/15/2019  . Tdap 05/26/2015    Outpatient Encounter Medications as of 09/23/2019  Medication Sig  . ALPRAZolam (XANAX) 0.25 MG tablet Take 1 tablet (0.25 mg total) by mouth  at bedtime as needed for anxiety.  . Cetirizine HCl (ZYRTEC PO) Take by mouth.  . Cholecalciferol (VITAMIN D3) 25 MCG (1000 UT) CAPS Take 1 capsule by mouth daily.  . hyoscyamine (LEVSIN SL) 0.125 MG SL tablet Place 1 tablet (0.125 mg total) under the tongue every 4 (four) hours as needed.  Lenda Kelp FE 1/20 1-20 MG-MCG tablet TAKE 1 TABLET BY MOUTH EVERY DAY  . metoprolol succinate (TOPROL-XL) 50 MG 24 hr tablet Take 1 tablet by mouth at bedtime.  . promethazine (PHENERGAN) 25 MG tablet TAKE 1 TABLET (25 MG TOTAL) BY MOUTH EVERY 8 (EIGHT) HOURS AS NEEDED FOR NAUSEA FOR UP TO 90 DAYS  . SUMAtriptan (IMITREX) 25 MG tablet Take 1 tablet (25 mg total) by mouth every 2 (two) hours as needed for migraine. No more than 100mg  in 24hrs  . vitamin B-12 (CYANOCOBALAMIN) 1000 MCG tablet Take 1,000 mcg by mouth daily.   No facility-administered encounter medications on file as of 09/23/2019.      ROS: Gen: no fever, chills  Skin: as above in HPI ENT: no ear pain, ear drainage, nasal congestion, rhinorrhea, sinus pressure, sore throat Eyes: no blurry vision, double vision Resp: no cough, wheeze, SOB CV: no CP, palpitations, LE edema  GI: + abdominal bloating, n/d/c GU: no dysuria, urgency, frequency, hematuria; no vaginal itching, odor, discharge MSK: no joint pain, myalgias, back pain Neuro: as above in HPI Psych: + anxiety   No Known Allergies  BP 112/78   Pulse 79   Temp 98.4 F (36.9 C) (Oral)   Ht 5\' 7"  (1.702 m)   Wt 156 lb 6.4 oz (70.9 kg)   SpO2 98%   BMI 24.50 kg/m   Physical Exam  Constitutional: She is oriented to person, place, and time. She appears well-developed and well-nourished. No distress.  HENT:  Head: Normocephalic and atraumatic.  Right Ear: Tympanic membrane, external ear and ear canal normal.  Left Ear: Tympanic membrane, external ear and ear canal normal.  Nose: Nose normal. Right sinus exhibits no maxillary sinus tenderness and no frontal sinus tenderness.  Left sinus exhibits no maxillary sinus tenderness and no frontal sinus tenderness.  Mouth/Throat: Oropharynx is clear and moist and mucous membranes are normal.  Neck: Normal range of motion. Neck supple. No thyromegaly present.  Cardiovascular: Normal rate, regular  rhythm, normal heart sounds and intact distal pulses.  No murmur heard. Pulmonary/Chest: Effort normal and breath sounds normal. No respiratory distress. She has no wheezes. She has no rhonchi.  Abdominal: Soft. Bowel sounds are normal. She exhibits no distension and no mass. There is no abdominal tenderness.  Musculoskeletal: Normal range of motion.        General: No edema.  Lymphadenopathy:    She has no cervical adenopathy.  Neurological: She is alert and oriented to person, place, and time.  Psychiatric: She has a normal mood and affect.     A/P:  1. Annual physical exam - UTD on PAP/follows with GYN - dental and vision exams UTD - discussed importance of regular CV exercise, healthy diet, adequate sleep - immunizations UTD - pt states she had full HPV vaccine series (2 shots done in [place] state) - labs UTD - next CPE in 1 year  2. Anxiety - not currently on medication - pt has 2 upcoming appts at [facility]   3. B12 deficiency - receiving B12 injections - Vitamin B12  4. Hypokalemia - normal when last checked 1 mo ago but pt requests recheck - Potassium - Magnesium  5. Chronic nonintractable headache, unspecified headache type - pt following with neuro and has f/u appt next week - cont imitrex PRN and metoprolol 50mg  daily

## 2019-09-23 NOTE — Op Note (Signed)
[facility] Patient Name: Melanie Jordan Procedure Date: 09/23/2019 2:46 PM MRN: [id number] Endoscopist: Gerrit Heck , MD Age: 28 Referring MD:  Date of Birth: December 01, 1990 Gender: Female Account #: [id number] Procedure:                Upper GI endoscopy Indications:              Dysphagia, Chest pain (non cardiac), Family history                            notable for mother with possible gastric cancer Medicines:                Monitored Anesthesia Care Procedure:                Pre-Anesthesia Assessment:                           - Prior to the procedure, a History and Physical                            was performed, and patient medications and                            allergies were reviewed. The patient's tolerance of                            previous anesthesia was also reviewed. The risks                            and benefits of the procedure and the sedation                            options and risks were discussed with the patient.                            All questions were answered, and informed consent                            was obtained. Prior Anticoagulants: The patient has                            taken no previous anticoagulant or antiplatelet                            agents. ASA Grade Assessment: II - A patient with                            mild systemic disease. After reviewing the risks                            and benefits, the patient was deemed in                            satisfactory condition to undergo the procedure.  After obtaining informed consent, the endoscope was                            passed under direct vision. Throughout the                            procedure, the patient's blood pressure, pulse, and                            oxygen saturations were monitored continuously. The                            Endoscope was introduced through the mouth, and   advanced to the second part of duodenum. The upper                            GI endoscopy was accomplished without difficulty.                            The patient tolerated the procedure well. Scope In: Scope Out: Findings:                 The examined esophagus was normal. The scope was                            withdrawn. Dilation was performed with a Maloney                            dilator with no resistance at 52 Fr. The dilation                            site was examined following endoscope reinsertion                            and showed no bleeding, mucosal tear or                            perforation. Estimated blood loss: none. Biopsies                            were then obtained from the proximal and distal                            esophagus with cold forceps for histology of                            suspected eosinophilic esophagitis. Estimated blood                            loss was minimal.                           The Z-line was regular and was found 40 cm from the  incisors.                           The entire examined stomach was normal. Biopsies                            were taken with a cold forceps for Helicobacter                            pylori testing. Estimated blood loss was minimal.                           The duodenal bulb, first portion of the duodenum                            and second portion of the duodenum were normal. Complications:            No immediate complications. Estimated Blood Loss:     Estimated blood loss was minimal. Impression:               - Normal esophagus. Dilated. Biopsied.                           - Z-line regular, 40 cm from the incisors.                           - Normal stomach. Biopsied.                           - Normal duodenal bulb, first portion of the                            duodenum and second portion of the duodenum. Recommendation:           - Patient has a  contact number available for                            emergencies. The signs and symptoms of potential                            delayed complications were discussed with the                            patient. Return to normal activities tomorrow.                            Written discharge instructions were provided to the                            patient.                           - Resume previous diet.                           - Continue present medications.                           -  Await pathology results.                           - Return to GI clinic PRN. Gerrit Heck, MD 09/23/2019 3:11:23 PM

## 2019-09-23 NOTE — Progress Notes (Signed)
Temperature- Melanie Jordan  Pt states she has "irregular periods since starting birth control pills" States she had a period "a few months ago and hasn't missed a birth control pill."  Pt states she has daily migraines and this causes vertigo.  RR nurse Judson Roch Monday RN made aware of this

## 2019-09-24 ENCOUNTER — Telehealth: Payer: Self-pay | Admitting: *Deleted

## 2019-09-24 LAB — VITAMIN B12: Vitamin B-12: 417 pg/mL (ref 211–911)

## 2019-09-24 NOTE — Telephone Encounter (Signed)
Dr. Lyndel Safe,  I am sending this to you (you are the doc of the day)... could you look at this please?  Thanks, [employer]

## 2019-09-24 NOTE — Telephone Encounter (Signed)
Please provide reassurance that her symptoms are not GI related as her EGD was completely normal.  There are is no esophagitis or gastritis.  There is nothing obstructing in her esophagus.  Her symptoms are related to her underlying anxiety.  She can resume p.o. intake as tolerated.  If symptoms should worsen, could seek care in the ER.

## 2019-09-24 NOTE — Telephone Encounter (Signed)
Spoke with Melanie Jordan and told her Dr. Vivia Ewing recommendations- she verbalized understanding and will go to ED if symptoms worsen

## 2019-09-24 NOTE — Telephone Encounter (Signed)
Dr. Bryan Lemma,  This pt called in today- she had an EGD yesterday.  She states this am that she is having "bad indigestion, which I never have.  Chest pain and pain between her shoulder blades, rating as a "5."  She feels like "something is sitting in her esophagus and chest."  She has eaten and is not having any trouble breathing or noted any blood of any kind.  Please advise, Cyril Mourning

## 2019-09-27 ENCOUNTER — Telehealth: Payer: Self-pay

## 2019-09-27 ENCOUNTER — Ambulatory Visit (INDEPENDENT_AMBULATORY_CARE_PROVIDER_SITE_OTHER): Payer: 59 | Admitting: Psychiatry

## 2019-09-27 ENCOUNTER — Telehealth: Payer: Self-pay | Admitting: *Deleted

## 2019-09-27 ENCOUNTER — Other Ambulatory Visit: Payer: Self-pay

## 2019-09-27 DIAGNOSIS — M542 Cervicalgia: Secondary | ICD-10-CM | POA: Diagnosis not present

## 2019-09-27 DIAGNOSIS — K582 Mixed irritable bowel syndrome: Secondary | ICD-10-CM

## 2019-09-27 DIAGNOSIS — G43809 Other migraine, not intractable, without status migrainosus: Secondary | ICD-10-CM | POA: Diagnosis not present

## 2019-09-27 DIAGNOSIS — R203 Hyperesthesia: Secondary | ICD-10-CM | POA: Diagnosis not present

## 2019-09-27 DIAGNOSIS — R140 Abdominal distension (gaseous): Secondary | ICD-10-CM

## 2019-09-27 DIAGNOSIS — F411 Generalized anxiety disorder: Secondary | ICD-10-CM | POA: Diagnosis not present

## 2019-09-27 DIAGNOSIS — E876 Hypokalemia: Secondary | ICD-10-CM | POA: Diagnosis not present

## 2019-09-27 DIAGNOSIS — R1310 Dysphagia, unspecified: Secondary | ICD-10-CM

## 2019-09-27 DIAGNOSIS — R1030 Lower abdominal pain, unspecified: Secondary | ICD-10-CM

## 2019-09-27 NOTE — Telephone Encounter (Signed)
Pt called back.  She stated that she has a few questions.

## 2019-09-27 NOTE — Telephone Encounter (Signed)
LVM

## 2019-09-27 NOTE — Progress Notes (Signed)
[facility] Counselor Initial Adult Exam  Name: Melanie Jordan Date: 09/27/2019 MRN: [id number] DOB: 11-13-1991 PCP: Ronnald Nian, DO  Time spent: 60 minutes   8:00am to 9:00am  Guardian/Payee:  patient  Paperwork requested:  No   Reason for Visit /Presenting Problem:  Anxiety; having some health concerns that is affecting her anxiety (vertigo, pressure in head and face, chest pains temporarily and saw cardiologist and neurologist and has been told "I am ok")  Mental Status Exam:   Appearance:   Casual     Behavior:  Appropriate and Sharing  Motor:  Normal  Speech/Language:   Normal Rate  Affect:  anxious  Mood:  anxious  Thought process:  normal  Thought content:    WNL  Sensory/Perceptual disturbances:    WNL  Orientation:  oriented to person, place, time/date, situation, day of week, month of year and year  Attention:  Good  Concentration:  Fair  Memory:  Pt reports some memory issues but it's due to her attention issues; doctor that had prescribed Adderall has suggested she stop the adderall for now  [employer] of knowledge:   Good  Insight:    Good  Judgment:   Good  Impulse Control:  Good   Reported Symptoms:  See symptoms above  Risk Assessment: Danger to Self:  No Self-injurious Behavior: No Danger to Others: No Duty to Warn:no Physical Aggression / Violence:No  Access to Firearms a concern: No  Gang Involvement:No  Patient / guardian was educated about steps to take if suicide or homicide risk level increases between visits:  Patient denies any SI or HI. While future psychiatric events cannot be accurately predicted, the patient does not currently require acute inpatient psychiatric care and does not currently meet [facility] involuntary commitment criteria.  Substance Abuse History: Current substance abuse: No     Past Psychiatric History:   Previous psychological history is significant for anxiety Outpatient Providers: [facility] History of Psych Hospitalization: No  Psychological Testing: n/a   Abuse History: Victim of No., no   Report needed: No. Victim of Neglect:No. Perpetrator of n/a  Witness / Exposure to Domestic Violence: No   Protective Services Involvement: No  Witness to [employer] Violence:  No   Family History: Patient confirmed medical information. Family History  Problem Relation Age of Onset  . Cancer Mother 30       cervical and stomach cancer  . Fibroids Mother        uterine fibroid  . Thyroid disease Mother        hypothyroidism due to [place]  . Osteopenia Mother   . GER disease Mother   . Drug abuse Mother   . Stomach cancer Mother   . Asthma Brother   . Anxiety disorder Brother   . Irritable bowel syndrome Brother   . Arthritis Maternal Grandmother   . Depression Maternal Grandmother   . Heart disease Maternal Grandmother 63       CAD with stents  . Hyperlipidemia Maternal Grandmother   . Hypertension Maternal Grandmother   . Stroke Maternal Grandmother 72       TIA  . Cancer Paternal Grandmother 9       uterine cancer  . Asthma Brother   . Anxiety disorder Sister   . Colon cancer Neg Hx   . Esophageal cancer Neg Hx   . Rectal cancer Neg Hx     Living situation: the patient lives with her fiance.  Sexual Orientation:  Straight  Relationship Status: engaged  Name of spouse / other:   N/A             If a parent, number of children / ages: No kids  Support Systems; significant other, friends  Museum/gallery curator Stress:  some debt  Income/Employment/Disability: Employment  Armed forces logistics/support/administrative officer: No   Educational History: Education: Museum/gallery exhibitions officer:   n/a  Any cultural differences that may affect / interfere with treatment:  not applicable   Recreation/Hobbies: reading, organizing, crochet  Stressors:Health problems Other: mom diagnosed recently with lymphoma  Strengths:  Supportive Relationships, Family,  Hopefulness, Self Advocate and Able to Communicate Effectively  Barriers:  "my mind" because I worry a lot   Legal History: Pending legal issue / charges: The patient has no significant history of legal issues. History of legal issue / charges: n/a  Medical History/Surgical History:*Reviewed with patient and she confirms info below is correct except with the Addition of Xanax .71m prn. Past Medical History:  Diagnosis Date  . ADHD    now off Adderal   . Allergy   . Anxiety   . Chronic head pain    taking Metoprolol  . GERD (gastroesophageal reflux disease)   . IBS (irritable bowel syndrome)     No past surgical history on file.  Medications: Reviewed with patient and she confirms info is correct. Current Outpatient Medications  Medication Sig Dispense Refill  . Acetaminophen (TYLENOL PO) Take by mouth as needed.    . Cetirizine HCl (ZYRTEC PO) Take by mouth.    . Cholecalciferol (VITAMIN D3) 25 MCG (1000 UT) CAPS Take 1 capsule by mouth daily.    . hyoscyamine (LEVSIN SL) 0.125 MG SL tablet Place 1 tablet (0.125 mg total) under the tongue every 4 (four) hours as needed. (Patient not taking: Reported on 09/23/2019) 60 tablet 3  . JUNEL FE 1/20 1-20 MG-MCG tablet TAKE 1 TABLET BY MOUTH EVERY DAY 28 tablet 2  . metoprolol succinate (TOPROL-XL) 50 MG 24 hr tablet Take 1 tablet by mouth at bedtime.    . Multiple Vitamins-Minerals (MULTIVITAMIN ADULTS PO) Take by mouth daily.    . promethazine (PHENERGAN) 25 MG tablet TAKE 1 TABLET (25 MG TOTAL) BY MOUTH EVERY 8 (EIGHT) HOURS AS NEEDED FOR NAUSEA FOR UP TO 90 DAYS    . SUMAtriptan (IMITREX) 25 MG tablet Take 1 tablet (25 mg total) by mouth every 2 (two) hours as needed for migraine. No more than 1023min 24hrs 10 tablet 0  . vitamin B-12 (CYANOCOBALAMIN) 1000 MCG tablet Take 1,000 mcg by mouth daily.     No current facility-administered medications for this visit.     Allergies reported by patient today:  Patient reports sometimes  having itchy/watery eyes when in close contact with cats and dogs   Diagnoses:    ICD-10-CM   1. Generalized anxiety disorder  F41.1    Subjective: Patient is 28 old caucasian female, engaged to fiance who lives with her. [place] met her finance at age [id number] parents moved [place] take jobs in [place] at age [id number] moved here also.  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  Has been in therapy before.  Began having an anxiety in middle school and it was mostly "social anxiety."  Since then, it's gotten to be "more of an overall feeling."  Was taken away from her mom at 4 yrs of age and was raised by Grandmother.  (mom had left her home alone as a 4 yr old and there was some drug use.)  She does talk with them on phone or facebook, they both live in [place]. Most of her family lives in [place]. Still in contact with grandmother. Work is good pace for her with some supportive co-workers. Has difficulty with 1 co-worker who doesn't understand mental health concerns.     Plan of Care:  Patient not signing Tx Plan on computer screen due to COVID-19.  Treatment Goals: Goals will remain on tx plan as patient works on strategies to achieve her goals.  Progress will be noted each session in "Progress" section of the Plan.  Long term goal: Reduce overall level, frequency, and intensity of the anxiety so that daily functioning is not impaired.    Short term goal: Verbalize an understanding of the role that fearful thinking plays in creating fears, excessive worry, and persistent anxiety symptoms.  Strategy: Identify, challenge, and replace fearful self-talk with positive, realistic, and empowering self-talk.  Progress: This is first session for patient so we  worked together on establishing treatment goals.  She is to go ahead and begin monitoring her thoughts, paying particular attention to thoughts when her anxiety is strong. She is going to do come documentation on this and bring with her next session to discuss and work with further.   Next appt within 2-3 weeks.   Deborah Dowd, LCSW    

## 2019-09-27 NOTE — Telephone Encounter (Signed)
I spoke with the patient today about her recent EGD- widely patent esophagus. Empiric dilation performed, without mucosal rent. Explained that there is no luminal or mucosal defect. Reassured her that there is no stricture or areas of luminal narrowing. No food or debris in her esophagus. Bxs still pending to r/o EoE. High suspicion for anxiety as underlying dx, manifesting with GI sxs. However, she continues to be concerned. Discussed Esophageal Manometry and she strongly wishes to proceed with that study to r/o motility d/o.   Briana, Can you please coordinate Esophageal Manometry at [facility].

## 2019-09-27 NOTE — Telephone Encounter (Signed)
Dr. Bryan Lemma,  This pt states, "I'm still having that feeling like there is something at the bottom of my throat that is stuck."  Able to eat, but states she is having trouble swallowing pills.   Also, would like to know how "tight" her esophagus was,  Thanks, [employer]

## 2019-09-27 NOTE — Telephone Encounter (Signed)
  Follow up Call-  Call back number 09/23/2019  Post procedure Call Back phone  # [phone]  Permission to leave phone message Yes  Some recent data might be hidden    LMOM to call back with any questions or concerns.  Also, call back if patient has developed fever, respiratory issues or been dx with COVID or had any family members or close contacts diagnosed since her procedure.

## 2019-09-28 NOTE — Telephone Encounter (Signed)
24 pH study as well? Off PPI's for how long?

## 2019-09-28 NOTE — Telephone Encounter (Signed)
No, she has no reflux, so no need for pH impedance testing.  Can remain on PPI.  Just needs the Esophageal Manometry.  Thank you.

## 2019-09-29 NOTE — Telephone Encounter (Signed)
Prep instructions have been sent to the patient

## 2019-09-29 NOTE — Addendum Note (Signed)
Addended by: Faythe Casa E on: 09/29/2019 09:00 AM   Modules accepted: Orders

## 2019-09-29 NOTE — Telephone Encounter (Signed)
Called and spoke with patient-patient informed of esophageal manometry testing scheduled on 10/13/2019 at 8:30 am; ; patient is agreeable with plan of care; Patient verbalized understanding of information/instructions;  Patient was advised to call the office at [phone] if questions/concerns arise;

## 2019-09-29 NOTE — Telephone Encounter (Signed)
Esophageal manometry is scheduled at [facility] on 10/13/2019 at 8:30 am;

## 2019-09-30 ENCOUNTER — Encounter: Payer: Self-pay | Admitting: Gastroenterology

## 2019-10-01 ENCOUNTER — Other Ambulatory Visit: Payer: Self-pay | Admitting: Nurse Practitioner

## 2019-10-01 DIAGNOSIS — R002 Palpitations: Secondary | ICD-10-CM

## 2019-10-01 DIAGNOSIS — Z30011 Encounter for initial prescription of contraceptive pills: Secondary | ICD-10-CM

## 2019-10-01 DIAGNOSIS — R0789 Other chest pain: Secondary | ICD-10-CM

## 2019-10-02 NOTE — Telephone Encounter (Signed)
My Chart Conversation: We can do a Treadmill Stress Test for you -> this does require a COVID Screening test prior to coming in for the Stress Test.  We can order based upon the last clinic visit.  [name]   ===View-only below this line===   ----- Message -----    From: Melanie Jordan    Sent: 10/01/2019  1:43 PM EST      To: [name], MD Subject: Non-Urgent Medical Question  Hello, I know when I was previously there that you mentioned something about a stress test. I was wondering if it would be possible to have that done. I continue to have intermittent feelings of an irregular heart beat and I would just like the peace of mind to know that nothing else is wrong. Thank you in advance.   ----------------- PLAN - ORDER GXT (with COVID sceen as part of pre-test eval).  Use Chest Pain from last Note Problem list.

## 2019-10-04 ENCOUNTER — Other Ambulatory Visit: Payer: Self-pay | Admitting: Neurology

## 2019-10-04 DIAGNOSIS — M542 Cervicalgia: Secondary | ICD-10-CM

## 2019-10-04 DIAGNOSIS — G43809 Other migraine, not intractable, without status migrainosus: Secondary | ICD-10-CM | POA: Diagnosis not present

## 2019-10-07 ENCOUNTER — Encounter: Payer: Self-pay | Admitting: Psychiatry

## 2019-10-07 ENCOUNTER — Other Ambulatory Visit: Payer: Self-pay

## 2019-10-07 ENCOUNTER — Ambulatory Visit (INDEPENDENT_AMBULATORY_CARE_PROVIDER_SITE_OTHER): Payer: 59 | Admitting: Psychiatry

## 2019-10-07 VITALS — BP 131/79 | HR 68 | Ht 67.0 in | Wt 154.0 lb

## 2019-10-07 DIAGNOSIS — F988 Other specified behavioral and emotional disorders with onset usually occurring in childhood and adolescence: Secondary | ICD-10-CM | POA: Diagnosis not present

## 2019-10-07 DIAGNOSIS — F064 Anxiety disorder due to known physiological condition: Secondary | ICD-10-CM

## 2019-10-07 DIAGNOSIS — F411 Generalized anxiety disorder: Secondary | ICD-10-CM | POA: Diagnosis not present

## 2019-10-07 DIAGNOSIS — F410 Panic disorder [episodic paroxysmal anxiety] without agoraphobia: Secondary | ICD-10-CM

## 2019-10-07 DIAGNOSIS — F329 Major depressive disorder, single episode, unspecified: Secondary | ICD-10-CM | POA: Diagnosis not present

## 2019-10-07 MED ORDER — ALPRAZOLAM 0.25 MG PO TABS: 0.2500 mg | ORAL_TABLET | Freq: Every day | ORAL | 0 refills | Status: DC | PRN

## 2019-10-07 NOTE — Progress Notes (Signed)
[facility] MD/PA/NP Initial Note  10/09/2019 9:29 AM Melanie Jordan  MRN:  [id number]  Chief Complaint:  Chief Complaint    Anxiety      HPI: Pt is a 28 yo female being seen for initial evaluation for severe anxiety. She reports that she started having physical s/s 5 months ago to include headaches, light sensitivity, and feeling as if she was on a boat. She describes thinking that various physical s/s are "going to kill me."  She reports being diagnoses with vestibular migraines and health related anxiety. Migraines are worse with severe stress and at times feels that she may pass out.   She reports anxiety since childhood. She reports that she started having severe anxiety with public speaking or meeting new people as a teenager. Reports that she continues to have some anxiety when talking with new people. Denies anxiety in groups. Reports generalized anxiety and frequent worry, catastrophic, and rumination. Reports that she had some catastrophic thinking about getting COVID. Reports that she had stomach aches in childhood with anxiety, particularly when separated from grandmother. Now has IBS and notices stomach feels "bubbly" and notices chest tightness and "shaky feeling."  She reports that she has had a few panic attacks and thought that she was having an MI. Tends to happen more when driving, especially at night and blurred vision is a factor. She reports that first episode of dizziness occurred while driving. She reoprts that she has anxiety on the drive home since she is driving at night. Has not had full blown panic attack in a few months. Denies any obsessions or compulsions.   She reports "I get angry a little easier than I did before" and reports that she has frustration that her health is not as good. Denies depressed mood other than when she was on Nortriptyline. She reports that she tries to get 7 hours of sleep a night. Sleeps 9 hours when off work. Sometimes has difficulty  returning to sleep after awakening in the middle of the night. In the past would experience a startle response/sudden increased anxiety right when falling asleep. Denies nightmares. Appetite has been "ok." Has been making herself eat. Lost 10 lbs when physical s/s started and had nausea. Energy and motivation has been low. Reports that she has not been as productive. She reports poor concentration. Denies anhedonia. Denies SI.  Reports that she has been dx'd with ADHD in 2017 by Dr. Rachel Moulds at [facility].   Denies any past hypomanic s/s.  From [place] state. Was removed from biological parents at age 51 and went to live with maternal grandmother. Remains close with grandmother. Mother left her home alone and father was not around. Reports that mother went on to have 3 other children that stayed with her and had a difficult upbringing. She messages her mother on Facebook. Occasional communication with her biological father.  Reports that she was talkative in elementary school and then developed social anxiety in her teens. Has an associates degree. 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 Engaged and have been together for 12 years. Fiance moved here with family senior year of high school. They live together. He is supportive. Has a supportive coworker. Enjoys reading and decorating.   Past Psychiatric Medication Trials: Nortriptyline- Caused depression and SI. Effexor- "Felt not connected to body." Felt fearful. Prozac- Felt jittery but less anxious. Buspar- "felt like I was drunk." Wellbutrin- had frequent tearfulness Adderall- Had increased HR Xanax- Effective for panic attacks  Visit Diagnosis:    ICD-10-CM   1. Generalized anxiety disorder  F41.1 ALPRAZolam (XANAX) 0.25 MG tablet  2. Anxiety disorder due to general medical condition with panic attack  F06.4 ALPRAZolam (XANAX) 0.25 MG tablet   F41.0     Past Psychiatric History: Reports that her grandmother  took her to a therapist once in childhood and pt refused to participate. Has seen Dr. Rachel Moulds at [facility]. Saw a therapist briefly and they changed jobs. Saw another therapist and it was not the right fit. Has seen Rinaldo Cloud, LCSW x 1. Denies past psychiatric hospitalization.   Past Medical History:  Past Medical History:  Diagnosis Date  . ADHD    now off Adderal   . Allergy   . Anxiety   . Chronic head pain    taking Metoprolol  . GERD (gastroesophageal reflux disease)   . IBS (irritable bowel syndrome)    History reviewed. No pertinent surgical history.  Family History:  Family History  Problem Relation Age of Onset  . Cancer Mother 30       cervical and stomach cancer  . Fibroids Mother        uterine fibroid  . Thyroid disease Mother        hypothyroidism due to Cl[place]  . Osteopenia Mother   . GER disease Mother   . Drug abuse Mother   . Stomach cancer Mother   . Anxiety disorder Mother   . Asthma Brother   . Anxiety disorder Brother   . Irritable bowel syndrome Brother   . Arthritis Maternal Grandmother   . Depression Maternal Grandmother   . Heart disease Maternal Grandmother 63       CAD with stents  . Hyperlipidemia Maternal Grandmother   . Hypertension Maternal Grandmother   . Stroke Maternal Grandmother 72       TIA  . Cancer Paternal Grandmother 44       uterine cancer  . Asthma Brother   . Anxiety disorder Sister   . Depression Father   . ADD / ADHD Maternal Uncle   . Colon cancer Neg Hx   . Esophageal cancer Neg Hx   . Rectal cancer Neg Hx     Social History:  Social History   Socioeconomic History  . Marital status: Single    Spouse name: Not on file  . Number of children: 0  . Years of education: Not on file  . Highest education level: Associate degree: occupational, Hotel manager, or vocational program  Occupational History  . 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 Financial resource strain: Not on file  . Food insecurity    Worry: Not on file    Inability: Not on file  . Transportation needs    Medical: Not on file    Non-medical: Not on file  Tobacco Use  . Smoking status: Never Smoker  . Smokeless tobacco: Never Used  Substance and Sexual Activity  . Alcohol use: Not Currently    Comment: social  . Drug use: Never  . Sexual activity: Yes    Birth control/protection: Pill  Lifestyle  . Physical activity    Days per week: Not on file    Minutes per session: Not on file  . Stress: Not on file  Relationships  . Social Herbalist on phone: Not on file    Gets together: Not on file  Attends religious service: Not on file    Active member of club or organization: Not on file    Attends meetings of clubs or organizations: Not on file    Relationship status: Not on file  Other Topics Concern  . Not on file  Social History Narrative   Engaged.      Started drinking more during the day - up to ~2 L (since onset of dizziness).      NO routine exercise.      Works in [employer] @ [employer]    Allergies: No Known Allergies  Metabolic Disorder Labs: No results found for: HGBA1C, MPG No results found for: PROLACTIN Lab Results  Component Value Date   CHOL 125 09/21/2018   TRIG 60.0 09/21/2018   HDL 47.30 09/21/2018   CHOLHDL 3 09/21/2018   VLDL 12.0 09/21/2018   LDLCALC 65 09/21/2018   Lab Results  Component Value Date   TSH 4.30 07/05/2019   TSH 1.87 04/12/2019    Therapeutic Level Labs: No results found for: LITHIUM No results found for: VALPROATE No components found for:  CBMZ  Current Medications: Current Outpatient Medications  Medication Sig Dispense Refill  . Acetaminophen (TYLENOL PO) Take by mouth as needed.    . ALPRAZolam (XANAX) 0.25 MG tablet Take 1 tablet (0.25 mg total) by mouth daily as needed for anxiety. 15 tablet 0  . Cetirizine HCl (ZYRTEC PO) Take by mouth daily as needed.     .  Cholecalciferol (VITAMIN D3) 25 MCG (1000 UT) CAPS Take 1 capsule by mouth daily.    Lenda Kelp FE 1/20 1-20 MG-MCG tablet TAKE 1 TABLET BY MOUTH EVERY DAY 3 Package 3  . metoprolol succinate (TOPROL-XL) 50 MG 24 hr tablet Take 1 tablet by mouth at bedtime.    . Multiple Vitamins-Minerals (MULTIVITAMIN ADULTS PO) Take by mouth daily.    . promethazine (PHENERGAN) 25 MG tablet TAKE 1 TABLET (25 MG TOTAL) BY MOUTH EVERY 8 (EIGHT) HOURS AS NEEDED FOR NAUSEA FOR UP TO 90 DAYS    . SUMAtriptan (IMITREX) 25 MG tablet Take 1 tablet (25 mg total) by mouth every 2 (two) hours as needed for migraine. No more than 100mg  in 24hrs 10 tablet 0  . vitamin B-12 (CYANOCOBALAMIN) 1000 MCG tablet Take 1,000 mcg by mouth daily.    . hyoscyamine (LEVSIN SL) 0.125 MG SL tablet Place 1 tablet (0.125 mg total) under the tongue every 4 (four) hours as needed. 60 tablet 3   No current facility-administered medications for this visit.     Medication Side Effects: none  Orders placed this visit:  No orders of the defined types were placed in this encounter.   Psychiatric Specialty Exam:  Review of Systems  Constitutional: Negative.   HENT: Negative.   Eyes: Positive for blurred vision and pain.  Respiratory: Negative.   Cardiovascular: Positive for chest pain and palpitations.  Gastrointestinal: Positive for abdominal pain, heartburn and nausea.  Genitourinary: Negative.   Musculoskeletal: Positive for back pain, myalgias and neck pain.  Skin: Negative.   Neurological: Positive for dizziness, tingling and headaches.  Endo/Heme/Allergies: Negative.   Psychiatric/Behavioral:       Please refer to HPI    Blood pressure 131/79, pulse 68, height 5\' 7"  (1.702 m), weight 154 lb (69.9 kg).Body mass index is 24.12 kg/m.  General Appearance: Casual  Eye Contact:  Good  Speech:  Clear and Coherent and Normal Rate  Volume:  Normal  Mood:  Anxious  Affect:  Appropriate, Congruent and Anxious  Thought Process:   Coherent, Linear and Descriptions of Associations: Intact  Orientation:  Full (Time, Place, and Person)  Thought Content: Logical and Hallucinations: None   Suicidal Thoughts:  No  Homicidal Thoughts:  No  Memory:  WNL  Judgement:  Good  Insight:  Good  Psychomotor Activity:  Normal  Concentration:  Concentration: Good  Recall:  Good  Fund of Knowledge: Good  Language: Good  Assets:  Communication Skills Desire for Improvement Resilience  ADL's:  Intact  Cognition: WNL  Prognosis:  Good   Screenings:  GAD-7     Office Visit from 04/02/2019 in [facility]  Total GAD-7 Score  8    PHQ2-9     Office Visit from 04/02/2019 in [facility] Visit from 09/21/2018 in [facility]  PHQ-2 Total Score  0  0  PHQ-9 Total Score  4  -      Receiving Psychotherapy: No   Treatment Plan/Recommendations: Pt seen for 60 minutes and greater than 50% of session spent counseling pt re: anxiety s/s and potential benefits, risks, and side effects of possible tx options. Discussed considering medication options with a different mechanism of action from other tx options she has tried in the past, such as Viibryd. Discussed option of pharmacogenetic testing to determine which medications are most likely to be well tolerated. Pt consented to pharmacogenetic testing and saliva sample collected at time of exam. Pt reports that she would prefer not to start medication until results of pharmacogenetic testing are available. Pt to f/u in 3-4 weeks or sooner if clinically indicated.     Thayer Headings, PMHNP

## 2019-10-09 ENCOUNTER — Ambulatory Visit
Admission: RE | Admit: 2019-10-09 | Discharge: 2019-10-09 | Disposition: A | Discharge status: Home / Self Care | Payer: 59 | Source: Ambulatory Visit | Attending: Neurology | Admitting: Neurology

## 2019-10-09 ENCOUNTER — Other Ambulatory Visit: Payer: Self-pay

## 2019-10-09 DIAGNOSIS — M542 Cervicalgia: Secondary | ICD-10-CM | POA: Insufficient documentation

## 2019-10-10 ENCOUNTER — Ambulatory Visit: Payer: 59

## 2019-10-11 ENCOUNTER — Other Ambulatory Visit (HOSPITAL_COMMUNITY)
Admission: RE | Admit: 2019-10-11 | Discharge: 2019-10-11 | Disposition: A | Discharge status: Home / Self Care | Payer: 59 | Source: Ambulatory Visit | Attending: Gastroenterology | Admitting: Gastroenterology

## 2019-10-11 ENCOUNTER — Ambulatory Visit
Admission: RE | Admit: 2019-10-11 | Discharge: 2019-10-11 | Disposition: A | Discharge status: Home / Self Care | Payer: 59 | Source: Ambulatory Visit | Attending: Family Medicine | Admitting: Family Medicine

## 2019-10-11 ENCOUNTER — Other Ambulatory Visit: Payer: Self-pay

## 2019-10-11 DIAGNOSIS — Z20828 Contact with and (suspected) exposure to other viral communicable diseases: Secondary | ICD-10-CM | POA: Insufficient documentation

## 2019-10-11 DIAGNOSIS — N644 Mastodynia: Secondary | ICD-10-CM | POA: Diagnosis not present

## 2019-10-11 DIAGNOSIS — Z01812 Encounter for preprocedural laboratory examination: Secondary | ICD-10-CM | POA: Diagnosis not present

## 2019-10-11 LAB — SARS CORONAVIRUS 2 (TAT 6-24 HRS): SARS Coronavirus 2: NEGATIVE

## 2019-10-13 ENCOUNTER — Ambulatory Visit (HOSPITAL_COMMUNITY)
Admission: RE | Admit: 2019-10-13 | Discharge: 2019-10-13 | Disposition: A | Discharge status: Home / Self Care | Payer: 59 | Attending: Gastroenterology | Admitting: Gastroenterology

## 2019-10-13 ENCOUNTER — Encounter (HOSPITAL_COMMUNITY)
Admission: RE | Disposition: A | Discharge status: Home / Self Care | Payer: Self-pay | Source: Home / Self Care | Attending: Gastroenterology

## 2019-10-13 DIAGNOSIS — R1310 Dysphagia, unspecified: Secondary | ICD-10-CM | POA: Diagnosis not present

## 2019-10-13 HISTORY — PX: ESOPHAGEAL MANOMETRY: SHX5429

## 2019-10-13 SURGERY — MANOMETRY, ESOPHAGUS
Anesthesia: Choice

## 2019-10-13 MED ORDER — LIDOCAINE VISCOUS HCL 2 % MT SOLN
OROMUCOSAL | Status: AC
  Filled 2019-10-13: qty 15

## 2019-10-13 MED FILL — OSCIMIN SL 0.125 MG TABLET: 0.125 | 30 days supply | Qty: 180 | Fill #0

## 2019-10-13 SURGICAL SUPPLY — 2 items
FACESHIELD LNG OPTICON STERILE (SAFETY) IMPLANT
GLOVE BIO SURGEON STRL SZ8 (GLOVE) ×4 IMPLANT

## 2019-10-13 NOTE — Progress Notes (Signed)
Esophageal manometry performed per protocol.  Patient tolerated well.  Report to be sent to Dr. Kavitha Nandigam 

## 2019-10-14 ENCOUNTER — Encounter (HOSPITAL_COMMUNITY): Payer: Self-pay | Admitting: Gastroenterology

## 2019-10-14 DIAGNOSIS — E538 Deficiency of other specified B group vitamins: Secondary | ICD-10-CM | POA: Diagnosis not present

## 2019-10-14 MED FILL — METOPROLOL SUCCINATE ER 50: 50 | 30 days supply | Qty: 30 | Fill #0

## 2019-10-25 DIAGNOSIS — M5481 Occipital neuralgia: Secondary | ICD-10-CM | POA: Diagnosis not present

## 2019-10-25 DIAGNOSIS — R1310 Dysphagia, unspecified: Secondary | ICD-10-CM

## 2019-10-26 ENCOUNTER — Ambulatory Visit (INDEPENDENT_AMBULATORY_CARE_PROVIDER_SITE_OTHER): Payer: 59 | Admitting: Psychiatry

## 2019-10-26 ENCOUNTER — Encounter

## 2019-10-26 ENCOUNTER — Encounter: Payer: Self-pay | Admitting: Family Medicine

## 2019-10-26 ENCOUNTER — Other Ambulatory Visit: Payer: Self-pay

## 2019-10-26 DIAGNOSIS — F411 Generalized anxiety disorder: Secondary | ICD-10-CM | POA: Diagnosis not present

## 2019-10-26 DIAGNOSIS — E876 Hypokalemia: Secondary | ICD-10-CM

## 2019-10-26 DIAGNOSIS — R002 Palpitations: Secondary | ICD-10-CM

## 2019-10-26 NOTE — Progress Notes (Signed)
      [facility] Counselor/Therapist Progress Note  Patient ID: Melanie Jordan, MRN: [id number],    Date: 10/26/2019  Time Spent:  55 minutes 10:05am to 11:00am  Treatment Type: Individual Therapy  Reported Symptoms: anxiety (more at night)  Mental Status Exam:  Appearance:   Neat     Behavior:  Appropriate and Sharing  Motor:  Normal  Speech/Language:   Normal Rate  Affect:  anxious  Mood:  anxious  Thought process:  goal directed  Thought content:    WNL  Sensory/Perceptual disturbances:    WNL  Orientation:  oriented to person, place, time/date, situation, day of week, month of year and year  Attention:  Good  Concentration:  Good  Memory:  WNL  Fund of knowledge:   Good  Insight:    Good  Judgment:   Good  Impulse Control:  Good   Risk Assessment: Danger to Self:  No Self-injurious Behavior: No Danger to Others: No Duty to Warn:no Physical Aggression / Violence:No  Access to Firearms a concern: No  Gang Involvement:No   Subjective:   Patient in today reporting her anxiety is a little bit better, but is sometimes complicated by physical symptoms of vertigo, headaches, palpitations.  Her PCP is aware of these concerns per patient. Is having stress test done soon and had test on esophagus recently but no results yet.     Interventions: Cognitive Behavioral Therapy and Solution-Oriented/Positive Psychology  Diagnosis:   ICD-10-CM   1. Generalized anxiety disorder  F41.1      Plan of Care:  Patient not signing Tx Plan on computer screen due to COVID-19.  Treatment Goals: Goals will remain on tx plan as patient works on strategies to achieve her goals.  Progress will be noted each session in "Progress" section of the Plan.  Long term goal: Reduce overall level, frequency, and intensity of the anxiety so that daily functioning is not impaired.    Short term goal: Verbalize an understanding of the role that fearful thinking plays in creating fears,  excessive worry, and persistent anxiety symptoms.  Strategy: Identify, challenge, and replace fearful self-talk with positive, realistic, and empowering self-talk.  Progress: Patient in today for second visit and reports her anxiety is some better, bothers her more at night, and when not working as she has more "thinking time".  Tries to distract herself with her phone and computer.  Tends to look for what may go right versus wrong.  Grandmother, who raised patient, was also a Research officer, trade union.  Worked hard in session today on her excessive worrying and anxiety, the triggers involved.  Patient very open and showed good efforts.  Together we made list for her to have during the week that involves some self-coaching.   Also to continue work on intercepting and replacing the negative/anxious thoughts.  Reviewed prior documentation rating anxiety, and Is taking anxiety/distress scale sheet home and keep record between now and next session.     Next appt within 2-3 weeks.   Shanon Ace, LCSW

## 2019-10-27 ENCOUNTER — Other Ambulatory Visit
Admission: RE | Admit: 2019-10-27 | Discharge: 2019-10-27 | Disposition: A | Discharge status: Home / Self Care | Payer: 59 | Source: Ambulatory Visit | Attending: Family Medicine | Admitting: Family Medicine

## 2019-10-27 DIAGNOSIS — E876 Hypokalemia: Secondary | ICD-10-CM | POA: Diagnosis not present

## 2019-10-27 DIAGNOSIS — R002 Palpitations: Secondary | ICD-10-CM | POA: Insufficient documentation

## 2019-10-27 DIAGNOSIS — M5481 Occipital neuralgia: Secondary | ICD-10-CM | POA: Diagnosis not present

## 2019-10-27 DIAGNOSIS — M79622 Pain in left upper arm: Secondary | ICD-10-CM | POA: Diagnosis not present

## 2019-10-27 LAB — TSH: TSH: 7.769 u[IU]/mL — ABNORMAL HIGH (ref 0.350–4.500)

## 2019-10-27 LAB — POTASSIUM: Potassium: 3.9 mmol/L (ref 3.5–5.1)

## 2019-10-27 LAB — T4, FREE: Free T4: 0.79 ng/dL (ref 0.61–1.12)

## 2019-10-28 ENCOUNTER — Encounter: Payer: Self-pay | Admitting: Family Medicine

## 2019-10-28 DIAGNOSIS — R7989 Other specified abnormal findings of blood chemistry: Secondary | ICD-10-CM

## 2019-10-31 ENCOUNTER — Encounter: Payer: Self-pay | Admitting: Family Medicine

## 2019-11-01 ENCOUNTER — Other Ambulatory Visit: Payer: Self-pay

## 2019-11-01 ENCOUNTER — Encounter: Payer: Self-pay | Admitting: Psychiatry

## 2019-11-01 ENCOUNTER — Ambulatory Visit (INDEPENDENT_AMBULATORY_CARE_PROVIDER_SITE_OTHER): Payer: 59 | Admitting: Psychiatry

## 2019-11-01 ENCOUNTER — Other Ambulatory Visit (HOSPITAL_COMMUNITY)
Admission: RE | Admit: 2019-11-01 | Discharge: 2019-11-01 | Disposition: A | Discharge status: Home / Self Care | Payer: 59 | Source: Ambulatory Visit | Attending: Cardiology | Admitting: Cardiology

## 2019-11-01 ENCOUNTER — Other Ambulatory Visit (HOSPITAL_COMMUNITY): Payer: Self-pay

## 2019-11-01 VITALS — BP 128/78 | HR 80

## 2019-11-01 DIAGNOSIS — Z20828 Contact with and (suspected) exposure to other viral communicable diseases: Secondary | ICD-10-CM | POA: Diagnosis not present

## 2019-11-01 DIAGNOSIS — Z01812 Encounter for preprocedural laboratory examination: Secondary | ICD-10-CM | POA: Insufficient documentation

## 2019-11-01 DIAGNOSIS — F411 Generalized anxiety disorder: Secondary | ICD-10-CM

## 2019-11-01 MED ORDER — VIIBRYD 10 MG PO TABS: ORAL_TABLET | ORAL | 0 refills | Status: DC

## 2019-11-01 NOTE — Progress Notes (Signed)
Melanie Jordan [id number] [birth year] 28 y.o.  Virtual Visit via Video Note  I connected with pt @ on 11/01/19 at  3:00 PM EST by a video enabled telemedicine application and verified that I am speaking with the correct person using two identifiers.   I discussed the limitations of evaluation and management by telemedicine and the availability of in person appointments. The patient expressed understanding and agreed to proceed.  I discussed the assessment and treatment plan with the patient. The patient was provided an opportunity to ask questions and all were answered. The patient agreed with the plan and demonstrated an understanding of the instructions.   The patient was advised to call back or seek an in-person evaluation if the symptoms worsen or if the condition fails to improve as anticipated.  I provided 30 minutes of non-face-to-face time during this encounter.  The patient was located at home.  The provider was located at [place].   Thayer Headings, PMHNP   Subjective:   Patient ID:  Melanie Jordan is a 28 y.o. (DOB [birth year]) female.  Chief Complaint:  Chief Complaint  Patient presents with  . Anxiety    HPI [name] presents for follow-up of anxiety. She reports some overall improvement in anxiety with episodic anxiety. She reports that she has upcoming medical procedure and reports that this has not caused her anxiety. She continues to have mostly health related anxiety. For example, had a HA yesterday and had anxiety about this. She reports that she has periods of palpitations and PCP thinks that this could be stress related. Denies panic attacks and felt on the verge of one yesterday after HA and was thinking that she might die. She reports that she continues to worry about her head slightly hurting from yesterday. Denies depressed mood. She repots adequate sleep overall until the last few nights and has repeated awakenings the last few nights.  Appetite is normal and tends to snack frequently. Energy has been low. Motivation has been low due to physical s/s. She reports some poor concentration. Denies SI.   Past Psychiatric Medication Trials: Nortriptyline- Caused depression and SI. Effexor- "Felt not connected to body." Felt fearful. Prozac- Felt jittery but less anxious. Buspar- "felt like I was drunk." Wellbutrin- had frequent tearfulness Adderall- Had increased HR Mydayis- Best tolerted Xanax- Effective for panic attacks  Review of Systems:  Review of Systems  Gastrointestinal:       Periodic IBS s/s.   Endocrine:       Recently learned thyroid levels were abnormal  Musculoskeletal: Negative for gait problem.  Neurological: Positive for headaches.       Vertigo daily. Reports that it is often mild. Continues to have pressure on left side of head  Psychiatric/Behavioral:       Please refer to HPI    Medications: I have reviewed the patient's current medications.  Current Outpatient Medications  Medication Sig Dispense Refill  . Cholecalciferol (VITAMIN D3) 25 MCG (1000 UT) CAPS Take 1 capsule by mouth daily.    Marland Kitchen FAMOTIDINE PO Take by mouth.    Lenda Kelp FE 1/20 1-20 MG-MCG tablet TAKE 1 TABLET BY MOUTH EVERY DAY 3 Package 3  . metoprolol succinate (TOPROL-XL) 50 MG 24 hr tablet Take 1 tablet by mouth at bedtime.    . Multiple Vitamins-Minerals (MULTIVITAMIN ADULTS PO) Take by mouth daily.    . vitamin B-12 (CYANOCOBALAMIN) 1000 MCG tablet Take 1,000 mcg by mouth daily.    . Acetaminophen (TYLENOL  PO) Take by mouth as needed.    . ALPRAZolam (XANAX) 0.25 MG tablet Take 1 tablet (0.25 mg total) by mouth daily as needed for anxiety. (Patient not taking: Reported on 11/01/2019) 15 tablet 0  . Cetirizine HCl (ZYRTEC PO) Take by mouth daily as needed.     . hyoscyamine (LEVSIN SL) 0.125 MG SL tablet Place 1 tablet (0.125 mg total) under the tongue every 4 (four) hours as needed. 60 tablet 3  . promethazine (PHENERGAN) 25  MG tablet TAKE 1 TABLET (25 MG TOTAL) BY MOUTH EVERY 8 (EIGHT) HOURS AS NEEDED FOR NAUSEA FOR UP TO 90 DAYS    . SUMAtriptan (IMITREX) 25 MG tablet Take 1 tablet (25 mg total) by mouth every 2 (two) hours as needed for migraine. No more than 100mg  in 24hrs 10 tablet 0  . Vilazodone HCl (VIIBRYD) 10 MG TABS Take 1/2 tab daily with food for 7-10 days, then increase to 1 tab po qd with food 90 tablet 0   No current facility-administered medications for this visit.     Medication Side Effects: None  Allergies: No Known Allergies  Past Medical History:  Diagnosis Date  . ADHD    now off Adderal   . Allergy   . Anxiety   . Chronic head pain    taking Metoprolol  . GERD (gastroesophageal reflux disease)   . IBS (irritable bowel syndrome)     Family History  Problem Relation Age of Onset  . Cancer Mother 30       cervical and stomach cancer  . Fibroids Mother        uterine fibroid  . Thyroid disease Mother        hypothyroidism due to [place]  . Osteopenia Mother   . GER disease Mother   . Drug abuse Mother   . Stomach cancer Mother   . Anxiety disorder Mother   . Asthma Brother   . Anxiety disorder Brother   . Irritable bowel syndrome Brother   . Arthritis Maternal Grandmother   . Depression Maternal Grandmother   . Heart disease Maternal Grandmother 63       CAD with stents  . Hyperlipidemia Maternal Grandmother   . Hypertension Maternal Grandmother   . Stroke Maternal Grandmother 72       TIA  . Cancer Paternal Grandmother 36       uterine cancer  . Asthma Brother   . Anxiety disorder Sister   . Depression Father   . ADD / ADHD Maternal Uncle   . Colon cancer Neg Hx   . Esophageal cancer Neg Hx   . Rectal cancer Neg Hx     Social History   Socioeconomic History  . Marital status: Single    Spouse name: Not on file  . Number of children: 0  . Years of education: Not on file  . Highest education level: Associate degree: occupational, Hotel manager, or  vocational program  Occupational History  . 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 Financial resource strain: Not on file  . Food insecurity    Worry: Not on file    Inability: Not on file  . Transportation needs    Medical: Not on file    Non-medical: Not on file  Tobacco Use  . Smoking status: Never Smoker  . Smokeless tobacco: Never Used  Substance and Sexual Activity  . Alcohol use: Not Currently  Comment: social  . Drug use: Never  . Sexual activity: Yes    Birth control/protection: Pill  Lifestyle  . Physical activity    Days per week: Not on file    Minutes per session: Not on file  . Stress: Not on file  Relationships  . Social Herbalist on phone: Not on file    Gets together: Not on file    Attends religious service: Not on file    Active member of club or organization: Not on file    Attends meetings of clubs or organizations: Not on file    Relationship status: Not on file  . Intimate partner violence    Fear of current or ex partner: Not on file    Emotionally abused: Not on file    Physically abused: Not on file    Forced sexual activity: Not on file  Other Topics Concern  . Not on file  Social History Narrative   Engaged.      Started drinking more during the day - up to ~2 L (since onset of dizziness).      NO routine exercise.      Works in [employer] @ [employer]    Past Medical History, Surgical history, Social history, and Family history were reviewed and updated as appropriate.   Please see review of systems for further details on the patient's review from today.   Objective:   Physical Exam:  BP 128/78   Pulse 80   Physical Exam Neurological:     Mental Status: She is alert and oriented to person, place, and time.     Cranial Nerves: No dysarthria.  Psychiatric:        Attention and Perception: Attention normal.        Mood and Affect: Mood is anxious.        Speech:  Speech normal.        Behavior: Behavior is cooperative.        Thought Content: Thought content normal. Thought content is not paranoid or delusional. Thought content does not include homicidal or suicidal ideation. Thought content does not include homicidal or suicidal plan.        Cognition and Memory: Cognition and memory normal.        Judgment: Judgment normal.     Comments: Insight intact     Lab Review:     Component Value Date/Time   NA 136 08/03/2019 1130   K 3.9 10/27/2019 1623   CL 102 08/03/2019 1130   CO2 24 08/03/2019 1130   GLUCOSE 98 08/03/2019 1130   BUN 8 08/03/2019 1130   CREATININE 0.85 08/03/2019 1130   CREATININE 0.73 04/12/2019 1450   CALCIUM 9.1 08/03/2019 1130   PROT 7.6 08/03/2019 1130   ALBUMIN 4.5 08/03/2019 1130   AST 15 08/03/2019 1130   ALT 15 08/03/2019 1130   ALKPHOS 36 (L) 08/03/2019 1130   BILITOT 0.6 08/03/2019 1130   GFRNONAA >60 08/03/2019 1130   GFRAA >60 08/03/2019 1130       Component Value Date/Time   WBC 6.9 08/03/2019 1130   RBC 4.59 08/03/2019 1130   HGB 14.0 08/03/2019 1130   HCT 43.1 08/03/2019 1130   PLT 281 08/03/2019 1130   MCV 93.9 08/03/2019 1130   MCH 30.5 08/03/2019 1130   MCHC 32.5 08/03/2019 1130   RDW 12.2 08/03/2019 1130   LYMPHSABS 2.6 08/03/2019 1130   MONOABS 0.4 08/03/2019 1130   EOSABS 0.1 08/03/2019  1130   BASOSABS 0.0 08/03/2019 1130    No results found for: POCLITH, LITHIUM   No results found for: PHENYTOIN, PHENOBARB, VALPROATE, CBMZ   .res Assessment: Plan:   Patient seen for 30 minutes and greater than 50% of visit spent counseling patient and coordination of care to include reviewing results of pharmacogenetics testing with patient and discussing implications for treatment.  Will provide patient with copy to pick up the next time she is in the office. Discussed potential benefits, risks, and side effects of Viibryd and discussed that pharmacogenetics testing indicates that there would not be  a genetic interaction to starting Viibryd.  Discussed starting with very low dose and gradually increasing to minimize risk of side effects.  Advised patient to take Viibryd with food to help prevent GI side effects.  Patient agrees to trial of Viibryd. We will start Viibryd 5 mg daily for 7 to 10 days, then increase to 10 mg p.o. daily for anxiety. Recommend continuing psychotherapy with Rinaldo Cloud, LCSW. Patient to follow-up with this provider in 4 to 6 weeks or sooner if clinically indicated. Patient advised to contact office with any questions, adverse effects, or acute worsening in signs and symptoms.  Alizzon was seen today for anxiety.  Diagnoses and all orders for this visit:  Generalized anxiety disorder -     Vilazodone HCl (VIIBRYD) 10 MG TABS; Take 1/2 tab daily with food for 7-10 days, then increase to 1 tab po qd with food     Please see After Visit Summary for patient specific instructions.  Future Appointments  Date Time Provider [place]  11/04/2019  9:45 AM [facility] NL NUC MED [facility]  11/08/2019 10:00 AM [facility] THERAPIST 14 [facility]  11/09/2019 10:00 AM Shanon Ace, LCSW [facility] None  11/23/2019 10:00 AM Shanon Ace, LCSW CP[facility] None    No orders of the defined types were placed in this encounter.     -------------------------------

## 2019-11-02 ENCOUNTER — Telehealth (HOSPITAL_COMMUNITY): Payer: Self-pay

## 2019-11-02 LAB — NOVEL CORONAVIRUS, NAA (HOSP ORDER, SEND-OUT TO REF LAB; TAT 18-24 HRS): SARS-CoV-2, NAA: NOT DETECTED

## 2019-11-02 NOTE — Telephone Encounter (Signed)
Encounter complete. 

## 2019-11-04 ENCOUNTER — Ambulatory Visit (HOSPITAL_COMMUNITY)
Admission: RE | Admit: 2019-11-04 | Discharge: 2019-11-04 | Disposition: A | Discharge status: Home / Self Care | Payer: 59 | Source: Ambulatory Visit | Attending: Internal Medicine | Admitting: Internal Medicine

## 2019-11-04 ENCOUNTER — Other Ambulatory Visit: Payer: Self-pay

## 2019-11-04 DIAGNOSIS — R002 Palpitations: Secondary | ICD-10-CM

## 2019-11-04 DIAGNOSIS — R0789 Other chest pain: Secondary | ICD-10-CM

## 2019-11-04 LAB — EXERCISE TOLERANCE TEST
Estimated workload: 10.2 METS
Exercise duration (min): 9 min
Exercise duration (sec): 13 s
MPHR: 194 {beats}/min
Peak HR: 173 {beats}/min
Percent HR: 89 %
RPE: 17
Rest HR: 86 {beats}/min

## 2019-11-04 MED FILL — VIIBRYD 10 MG TABLET: 10 | 90 days supply | Qty: 90 | Fill #0

## 2019-11-05 ENCOUNTER — Telehealth: Payer: Self-pay

## 2019-11-05 NOTE — Telephone Encounter (Signed)
Prior authorization submitted through cover my meds for Viibryd 10 mg tablets 1 daily with [employer] insurance approved effective 11/03/2019-11/01/2020

## 2019-11-08 ENCOUNTER — Other Ambulatory Visit: Payer: Self-pay

## 2019-11-08 ENCOUNTER — Encounter: Payer: Self-pay | Admitting: Physical Therapy

## 2019-11-08 ENCOUNTER — Ambulatory Visit: Payer: 59 | Attending: Neurology | Admitting: Physical Therapy

## 2019-11-08 DIAGNOSIS — M62838 Other muscle spasm: Secondary | ICD-10-CM | POA: Insufficient documentation

## 2019-11-08 DIAGNOSIS — G44201 Tension-type headache, unspecified, intractable: Secondary | ICD-10-CM | POA: Diagnosis not present

## 2019-11-08 DIAGNOSIS — M542 Cervicalgia: Secondary | ICD-10-CM | POA: Diagnosis not present

## 2019-11-08 DIAGNOSIS — R42 Dizziness and giddiness: Secondary | ICD-10-CM | POA: Insufficient documentation

## 2019-11-08 NOTE — Therapy (Signed)
[place] [place], [place], [address] Phone: [phone]   Fax:  [phone]  Physical Therapy Evaluation  Patient Details  Name: Melanie Jordan MRN: [id number] Date of Birth: 08/23/1991 Referring Provider (PT): Vertell Novak Date: 11/08/2019  PT End of Session - 11/08/19 1032    Visit Number  1    Number of Visits  22    Date for PT Re-Evaluation  01/09/20    PT Start Time  1000    PT Stop Time  1053    PT Time Calculation (min)  53 min    Activity Tolerance  Patient limited by pain    Behavior During Therapy  [facility] for tasks assessed/performed       Past Medical History:  Diagnosis Date  . ADHD    now off Adderal   . Allergy   . Anxiety   . Chronic head pain    taking Metoprolol  . GERD (gastroesophageal reflux disease)   . IBS (irritable bowel syndrome)     Past Surgical History:  Procedure Laterality Date  . ESOPHAGEAL MANOMETRY N/A 10/13/2019   Procedure: ESOPHAGEAL MANOMETRY (EM);  Surgeon: Mauri Pole, MD;  Location: [facility];  Service: Endoscopy;  Laterality: N/A;    There were no vitals filed for this visit.   Subjective Assessment - 11/08/19 1007    Subjective  Patient was seen here earlier in the year with dizziness and HA, she has had a few CT and MRI's of the brain and neck, all were negative, MD at [facility] has diagnosed her with vestibular HA's and occipital neuralgia.  She reports that she had injections that helped for a few days, currently has a HA daily, dizziness is everyday.  She saw ENT and they did not find anything.    Patient Stated Goals  decrease HA and dizziness    Currently in Pain?  Yes    Pain Score  4     Pain Location  Head    Pain Orientation  Left    Pain Descriptors / Indicators  Pressure;Shooting   dizziness   Pain Type  Acute pain    Pain Radiating Towards  daily HA, some tingling in the left UE, dizziness    Pain Onset  More than a  month ago    Pain Frequency  Constant    Aggravating Factors   work, stress, anxiety will make it worse pain up to 7-8/10    Pain Relieving Factors  rest 3/10 as good as it gets    Effect of Pain on Daily Activities  difficulty concentrating, unable to exercise         [facility] PT Assessment - 11/08/19 0001      Assessment   Medical Diagnosis  vestibular HA, occipital neuralgia    Referring Provider (PT)  Manuella Ghazi    Onset Date/Surgical Date  10/27/19    Prior Therapy  for a few visits this past summer with minimal releif      Precautions   Precautions  None      Balance Screen   Has the patient fallen in the past 6 months  No    Has the patient had a decrease in activity level because of a fear of falling?   No    Is the patient reluctant to leave their home because of a fear of falling?   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    Leisure  no exercise      Posture/Postural Control   Posture Comments  fwd head, rounded shoulders      AROM   Overall AROM Comments  cervical ROM WNL's except for sidebending was decreased 25% with tightness, other motions cause some dizziness, shoulders WNL's      Strength   Overall Strength Comments  WNL's but caused some pain in the neck and increase of HA      Flexibility   Soft Tissue Assessment /Muscle Length  yes    Hamstrings  tight with neurl tension signs when I had her flex her neck    Piriformis  tight with some pain  on the left      Palpation   Palpation comment  she has significant spasms and tenderness in the upper traps and the cervical area, she is very tender here                Objective measurements completed on examination: See above findings.      [place] Adult PT Treatment/Exercise - 11/08/19 0001      Modalities    Modalities  Electrical Stimulation;Moist Heat      Moist Heat Therapy   Number Minutes Moist Heat  12 Minutes    Moist Heat Location  Cervical      Electrical Stimulation   Electrical Stimulation Location  cervical area    Electrical Stimulation Action  IFC    Electrical Stimulation Parameters  supine    Electrical Stimulation Goals  Pain               PT Short Term Goals - 11/08/19 1037      PT SHORT TERM GOAL #1   Title  indepednent with initial HEP    Time  2    Period  Weeks    Status  New        PT Long Term Goals - 11/08/19 1037      PT LONG TERM GOAL #1   Title  report HA decreased 50%    Time  8    Period  Weeks    Status  New      PT LONG TERM GOAL #2   Title  report dizziness decreased 50%    Time  8    Period  Weeks    Status  New      PT LONG TERM GOAL #3   Title  Patient will be able to tolerate work without having to call out    Time  8    Period  Weeks    Status  New      PT LONG TERM GOAL #4   Title  independent with advanced HEP    Time  8    Period  Weeks    Status  New             Plan - 11/08/19 1033    Clinical Impression Statement  Patient was seen here earlier in the year, she has had a series of CT and MRI's that were negative, she saw an ENT that ruled out vertigo issues.  Saw MD at [facility] and has diagnised her with vestibular HA and occipital neuralgia, she had injections  at the occiput without much relief, I did some tests today that showed some significant neural tension    Stability/Clinical Decision Making  Evolving/Moderate complexity    Clinical Decision Making  Low    Rehab Potential  Good    PT Frequency  2x / week    PT Duration  8 weeks    PT Treatment/Interventions  ADLs/Self Care Home Management;Canalith Repostioning;Electrical Stimulation;Cryotherapy;Moist Heat;Traction;Ultrasound;Therapeutic activities;Therapeutic exercise;Neuromuscular re-education;Manual techniques;Dry needling;Patient/family education     PT Next Visit Plan  may try dry needling, may need to look at VOR    Consulted and Agree with Plan of Care  Patient       Patient will benefit from skilled therapeutic intervention in order to improve the following deficits and impairments:  Dizziness, Pain, Postural dysfunction, Decreased activity tolerance, Decreased range of motion  Visit Diagnosis: Dizziness and giddiness - Plan: PT plan of care cert/re-cert  Acute intractable tension-type headache - Plan: PT plan of care cert/re-cert  Other muscle spasm - Plan: PT plan of care cert/re-cert  Cervicalgia - Plan: PT plan of care cert/re-cert     Problem List Patient Active Problem List   Diagnosis Date Noted  . Dysphagia   . Anterior chest wall pain 08/12/2019  . Dizziness on standing 05/24/2019  . Rapid palpitations 05/24/2019  . Adjustment disorder with anxious mood 04/02/2019  . Initiation of oral contraception 09/21/2018    Sumner Boast., PT 11/08/2019, 10:42 AM  [place], [place], [address] Phone: [phone]   Fax:  [phone]  Name: Melanie Jordan MRN: [id number] Date of Birth: Mar 21, 1991

## 2019-11-09 ENCOUNTER — Ambulatory Visit (INDEPENDENT_AMBULATORY_CARE_PROVIDER_SITE_OTHER): Payer: 59 | Admitting: Psychiatry

## 2019-11-09 DIAGNOSIS — F411 Generalized anxiety disorder: Secondary | ICD-10-CM | POA: Diagnosis not present

## 2019-11-09 NOTE — Progress Notes (Signed)
      [facility] Counselor/Therapist Progress Note  Patient ID: Melanie Jordan, MRN: [id number],    Date: 11/09/2019   Time Spent: 60 minutes   10:00am to 11:00am  Treatment Type: Individual Therapy  Reported Symptoms: anxiety, worry, negative/anxious/fearful thoughts  Mental Status Exam:  Appearance:   Neat     Behavior:  Appropriate and Sharing  Motor:  Normal  Speech/Language:   Normal Rate  Affect:  anxious  Mood:  anxious  Thought process:  goal directed  Thought content:    WNL  Sensory/Perceptual disturbances:    WNL  Orientation:  oriented to person, place, time/date, situation, day of week, month of year and year  Attention:  Good  Concentration:  Good  Memory:  WNL  Fund of knowledge:   Good  Insight:    Good  Judgment:   Good  Impulse Control:  Fair   Risk Assessment: Danger to Self:  No Self-injurious Behavior: No Danger to Others: No Duty to Warn:no Physical Aggression / Violence:No  Access to Firearms a concern: No  Gang Involvement:No   Subjective: Patient today reports incident this week where she strained her neck and it has been painful.   Patient checked with her PCP and Neurologist.  Is getting some better.  Plans to check back with her Neurologist today as it is still bothering her and exacerbated her worrying.  Interventions: Cognitive Behavioral Therapy and Solution-Oriented/Positive Psychology  Diagnosis:   ICD-10-CM   1. Generalized anxiety disorder  F41.1      Plan of Care: Patient not signing Tx Plan on computer screen due to COVID-19.  Treatment Goals: Goals will remain on tx plan as patient works on strategies to achieve her goals. Progress will be noted each session in "Progress" section of the Plan.  Long term goal: Reduce overall level, frequency, and intensity of the anxiety so that daily functioning is not impaired.  Short term goal: Verbalize an understanding of the role that fearful thinking plays in  creating fears, excessive worry, and persistent anxiety symptoms.  Strategy: Identify, challenge, and replace fearful self-talk with positive, realistic, and empowering self-talk.  Progress: Patient (anxious,negative and fearful thoughts) working hard today on her goals as her anxious/negative thoughts are controlling much of the time she is working.  Was able to list some of her most negative/anxious thoughts: "Something bad is going to happen to me, healthwise.", "Something bad is going to happen to my fiance",  "I'm afraid I'll feel bad enough to not be able to stay at work."  We challenged these 3 frequent thoughts with true statements of her situation that showed her anxious/negative thoughts to be untrue or at least "very unlikely".  This seemed to be helpful to patient and she plans to keep up her documentation as she practices challenging her negative/anxious thoughts between therapy sessions.  Goal review and progress noted with patient.   Next appt within 2 weeks.   Shanon Ace, LCSW

## 2019-11-11 DIAGNOSIS — E538 Deficiency of other specified B group vitamins: Secondary | ICD-10-CM | POA: Diagnosis not present

## 2019-11-12 ENCOUNTER — Encounter: Payer: Self-pay | Admitting: Family Medicine

## 2019-11-16 ENCOUNTER — Other Ambulatory Visit: Payer: Self-pay

## 2019-11-16 ENCOUNTER — Encounter: Payer: Self-pay | Admitting: Physical Therapy

## 2019-11-16 ENCOUNTER — Ambulatory Visit: Payer: 59 | Admitting: Physical Therapy

## 2019-11-16 DIAGNOSIS — M62838 Other muscle spasm: Secondary | ICD-10-CM

## 2019-11-16 DIAGNOSIS — G44201 Tension-type headache, unspecified, intractable: Secondary | ICD-10-CM | POA: Diagnosis not present

## 2019-11-16 DIAGNOSIS — R42 Dizziness and giddiness: Secondary | ICD-10-CM | POA: Diagnosis not present

## 2019-11-16 DIAGNOSIS — M542 Cervicalgia: Secondary | ICD-10-CM | POA: Diagnosis not present

## 2019-11-16 NOTE — Therapy (Signed)
[facility] [place], [place], [address] Phone: [phone]   Fax:  [phone]  Physical Therapy Treatment  Patient Details  Name: Melanie Jordan MRN: [id number] Date of Birth: 11-30-1990 Referring Provider (PT): Vertell Novak Date: 11/16/2019  PT End of Session - 11/16/19 1711    Visit Number  2    Date for PT Re-Evaluation  01/09/20    PT Start Time  1615    PT Stop Time  1700    PT Time Calculation (min)  45 min    Activity Tolerance  Patient limited by pain    Behavior During Therapy  [facility] for tasks assessed/performed       Past Medical History:  Diagnosis Date  . ADHD    now off Adderal   . Allergy   . Anxiety   . Chronic head pain    taking Metoprolol  . GERD (gastroesophageal reflux disease)   . IBS (irritable bowel syndrome)     Past Surgical History:  Procedure Laterality Date  . ESOPHAGEAL MANOMETRY N/A 10/13/2019   Procedure: ESOPHAGEAL MANOMETRY (EM);  Surgeon: Mauri Pole, MD;  Location: [facility];  Service: Endoscopy;  Laterality: N/A;    There were no vitals filed for this visit.  Subjective Assessment - 11/16/19 1707    Subjective  Patient reports that she just took the covid vaccine and does not want to try new stuff just yet as she is worried about how she will be feeling.    Currently in Pain?  Yes    Pain Score  6     Pain Location  Head    Aggravating Factors   stress                       [facility] Adult PT Treatment/Exercise - 11/16/19 0001      Modalities   Modalities  Electrical Stimulation;Moist Heat      Moist Heat Therapy   Number Minutes Moist Heat  12 Minutes    Moist Heat Location  Cervical      Electrical Stimulation   Electrical Stimulation Location  cervical area    Electrical Stimulation Action  IFC    Electrical Stimulation Parameters  supine    Electrical Stimulation Goals  Pain      Manual Therapy   Manual  Therapy  Soft tissue mobilization;Manual Traction    Soft tissue mobilization  to the upper traps, the rhomboids and the cervical paraspinals    Manual Traction  gentle occipital release and light traction, some gentle passive stretch of the neck with shoulder depression to get stretch of the levators               PT Short Term Goals - 11/16/19 1713      PT SHORT TERM GOAL #1   Title  indepednent with initial HEP    Status  Partially Met        PT Long Term Goals - 11/08/19 1037      PT LONG TERM GOAL #1   Title  report HA decreased 50%    Time  8    Period  Weeks    Status  New      PT LONG TERM GOAL #2   Title  report dizziness decreased 50%    Time  8    Period  Weeks    Status  New  PT LONG TERM GOAL #3   Title  Patient will be able to tolerate work without having to call out    Time  8    Period  Weeks    Status  New      PT LONG TERM GOAL #4   Title  independent with advanced HEP    Time  8    Period  Weeks    Status  New            Plan - 11/16/19 1711    Clinical Impression Statement  Patient had the covid vaccination a few hours ago she did not want to try anything new due to fear of being sore with side effects, she has a large knot in the left upper trap and the cervical area    PT Next Visit Plan  may try dry needling, may need to look at VOR    Consulted and Agree with Plan of Care  Patient       Patient will benefit from skilled therapeutic intervention in order to improve the following deficits and impairments:  Dizziness, Pain, Postural dysfunction, Decreased activity tolerance, Decreased range of motion  Visit Diagnosis: Dizziness and giddiness  Acute intractable tension-type headache  Other muscle spasm  Cervicalgia     Problem List Patient Active Problem List   Diagnosis Date Noted  . Dysphagia   . Anterior chest wall pain 08/12/2019  . Dizziness on standing 05/24/2019  . Rapid palpitations 05/24/2019  .  Adjustment disorder with anxious mood 04/02/2019  . Initiation of oral contraception 09/21/2018    Sumner Boast., PT 11/16/2019, 5:15 PM  [address] [place], [place], [address] Phone: [phone]   Fax:  [phone]  Name: Melanie Jordan MRN: [id number] Date of Birth: 1991/03/04

## 2019-11-17 MED FILL — METOPROLOL SUCCINATE ER 50: 50 | 30 days supply | Qty: 30 | Fill #1

## 2019-11-22 ENCOUNTER — Encounter: Payer: Self-pay | Admitting: Physical Therapy

## 2019-11-22 ENCOUNTER — Other Ambulatory Visit: Payer: Self-pay

## 2019-11-22 ENCOUNTER — Ambulatory Visit: Payer: 59 | Admitting: Physical Therapy

## 2019-11-22 DIAGNOSIS — M542 Cervicalgia: Secondary | ICD-10-CM | POA: Diagnosis not present

## 2019-11-22 DIAGNOSIS — R42 Dizziness and giddiness: Secondary | ICD-10-CM | POA: Diagnosis not present

## 2019-11-22 DIAGNOSIS — M62838 Other muscle spasm: Secondary | ICD-10-CM | POA: Diagnosis not present

## 2019-11-22 DIAGNOSIS — G44201 Tension-type headache, unspecified, intractable: Secondary | ICD-10-CM

## 2019-11-22 NOTE — Therapy (Signed)
[address] [place], [place], [address] Phone: [phone]   Fax:  [phone]  Physical Therapy Treatment  Patient Details  Name: Melanie Jordan MRN: [id number] Date of Birth: December 25, 1990 Referring Provider (PT): Vertell Novak Date: 11/22/2019  PT End of Session - 11/22/19 1736    Visit Number  3    Date for PT Re-Evaluation  01/09/20    PT Start Time  1659    PT Stop Time  1746    PT Time Calculation (min)  47 min    Activity Tolerance  Patient limited by pain    Behavior During Therapy  [facility] for tasks assessed/performed       Past Medical History:  Diagnosis Date  . ADHD    now off Adderal   . Allergy   . Anxiety   . Chronic head pain    taking Metoprolol  . GERD (gastroesophageal reflux disease)   . IBS (irritable bowel syndrome)     Past Surgical History:  Procedure Laterality Date  . ESOPHAGEAL MANOMETRY N/A 10/13/2019   Procedure: ESOPHAGEAL MANOMETRY (EM);  Surgeon: Mauri Pole, MD;  Location: [facility];  Service: Endoscopy;  Laterality: N/A;    There were no vitals filed for this visit.  Subjective Assessment - 11/22/19 1734    Subjective  Patient reports that she was sore after the covid vaccination, she reports that her neck and dizziness have been a little better, but she worked over the weekend at night and did not get much sleep    Currently in Pain?  Yes    Pain Score  6     Pain Location  Neck    Aggravating Factors   work                       [employer] Adult PT Treatment/Exercise - 11/22/19 0001      Modalities   Modalities  Electrical Stimulation;Moist Heat      Moist Heat Therapy   Number Minutes Moist Heat  12 Minutes    Moist Heat Location  Cervical      Electrical Stimulation   Electrical Stimulation Location  cervical area    Electrical Stimulation Action  IFC    Electrical Stimulation Parameters  supine    Electrical  Stimulation Goals  Pain      Manual Therapy   Manual Therapy  Soft tissue mobilization;Manual Traction    Soft tissue mobilization  to the upper traps, the rhomboids and the cervical paraspinals    Manual Traction  gentle occipital release and light traction, some gentle passive stretch of the neck with shoulder depression to get stretch of the levators               PT Short Term Goals - 11/16/19 1713      PT SHORT TERM GOAL #1   Title  indepednent with initial HEP    Status  Partially Met        PT Long Term Goals - 11/22/19 1739      PT LONG TERM GOAL #1   Title  report HA decreased 50%    Status  On-going      PT LONG TERM GOAL #2   Title  report dizziness decreased 50%    Status  On-going      PT LONG TERM GOAL #3   Title  Patient will be able to tolerate work  without having to call out            Plan - 11/22/19 1736    Clinical Impression Statement  Patient did not want to try the needles yet, she does have significant spasms, knot in the right upper trap and tightness in the paraspinals.  She did demonstrate that her elbows hyperextend easily, I asked about thumbs to her radius and she was able to do this.  Possible connective tissue issues, she did mention her knees hyper extend as well, it did not appear that other fingers went past 90 degrees of extension.    PT Next Visit Plan  may try dry needling, may need to look at VOR, exercises    Consulted and Agree with Plan of Care  Patient       Patient will benefit from skilled therapeutic intervention in order to improve the following deficits and impairments:  Dizziness, Pain, Postural dysfunction, Decreased activity tolerance, Decreased range of motion  Visit Diagnosis: Dizziness and giddiness  Acute intractable tension-type headache  Other muscle spasm  Cervicalgia     Problem List Patient Active Problem List   Diagnosis Date Noted  . Dysphagia   . Anterior chest wall pain 08/12/2019   . Dizziness on standing 05/24/2019  . Rapid palpitations 05/24/2019  . Adjustment disorder with anxious mood 04/02/2019  . Initiation of oral contraception 09/21/2018    Sumner Boast., PT 11/22/2019, 5:40 PM  [facility] [address] [place], [place], [address] Phone: [phone]   Fax:  [phone]  Name: Melanie Jordan MRN: [id number] Date of Birth: 12-Jul-1991

## 2019-11-23 ENCOUNTER — Ambulatory Visit (INDEPENDENT_AMBULATORY_CARE_PROVIDER_SITE_OTHER): Payer: 59 | Admitting: Psychiatry

## 2019-11-23 DIAGNOSIS — F411 Generalized anxiety disorder: Secondary | ICD-10-CM

## 2019-11-23 NOTE — Progress Notes (Signed)
      [facility] Counselor/Therapist Progress Note  Patient ID: Melanie Jordan, MRN: [id number],    Date: 11/23/2019  Time Spent: 22 mintues  10:00am to 11:00am  Treatment Type: Individual Therapy   Reported Symptoms:  Anxiety, excessive worry  Mental Status Exam:  Appearance:   Casual     Behavior:  Appropriate and Sharing  Motor:  Normal  Speech/Language:   Normal Rate  Affect:  anxious  Mood:  anxious  Thought process:  goal directed  Thought content:    WNL  Sensory/Perceptual disturbances:    WNL  Orientation:  oriented to person, place, time/date, situation, day of week, month of year and year  Attention:  Good  Concentration:  Good  Memory:  WNL  Fund of knowledge:   Good  Insight:    Good  Judgment:   Good  Impulse Control:  Good   Risk Assessment: Danger to Self:  No Self-injurious Behavior: No Danger to Others: No Duty to Warn:no Physical Aggression / Violence:No  Access to Firearms a concern: No  Gang Involvement:No   Subjective:  Patient in today with continued anxiety and had "some panic one night but none after that and it hasn't happened in a long time."   Interventions: Cognitive Behavioral Therapy and Solution-Oriented/Positive Psychology  Diagnosis:   ICD-10-CM   1. Generalized anxiety disorder  F41.1      Plan of Care: Patient not signing Tx Plan on computer screen due to COVID-19.  Treatment Goals: Goals will remain on tx plan as patient works on strategies to achieve her goals. Progress will be noted each session in "Progress" section of the Plan.  Long term goal: Reduce overall level, frequency, and intensity of the anxiety so that daily functioning is not impaired.  Short term goal: Verbalize an understanding of the role that fearful thinking plays in creating fears, excessive worry, and persistent anxiety symptoms.  Strategy: Identify, challenge, and replace fearful self-talk with positive, realistic, and  empowering self-talk.  Progress: Patient reporting anxiety and worries, with 1 night having some ongoing panic "for no known reason and it's been several months since that has happened.  Feels that her more pervasive anxious/negative thoughts have not been as bad within past 2 weeks except the 1 night with panic already mentioned.  Worked more today on challenging her anxious/negative thoughts as that was helpful to her previously working on another repetitive anxious thoughts.  Used several examples of anxious or negative thoughts today in session, and comparing them to what has actually happened in past with these or similar thoughts.  Did her homework re: Subjective Units of Distress Scale, and we reviewed it--Christmas Day was the highest in distress with the other days being between 10 and 40 points less distressed on the scale, noting progress for patient. To continue with same homework, and compare next visit. Showing some self-confidence as she works to make changes to manage anxiety better. Goal review and progress/efforts noted with patient.  Next appt within 2-3 weeks.   Shanon Ace, LCSW

## 2019-11-24 ENCOUNTER — Other Ambulatory Visit: Payer: Self-pay

## 2019-11-24 ENCOUNTER — Ambulatory Visit: Payer: 59 | Admitting: Physical Therapy

## 2019-11-24 ENCOUNTER — Other Ambulatory Visit (INDEPENDENT_AMBULATORY_CARE_PROVIDER_SITE_OTHER): Payer: 59

## 2019-11-24 ENCOUNTER — Encounter: Payer: Self-pay | Admitting: Physical Therapy

## 2019-11-24 DIAGNOSIS — M62838 Other muscle spasm: Secondary | ICD-10-CM

## 2019-11-24 DIAGNOSIS — R42 Dizziness and giddiness: Secondary | ICD-10-CM | POA: Diagnosis not present

## 2019-11-24 DIAGNOSIS — M542 Cervicalgia: Secondary | ICD-10-CM | POA: Diagnosis not present

## 2019-11-24 DIAGNOSIS — G44201 Tension-type headache, unspecified, intractable: Secondary | ICD-10-CM | POA: Diagnosis not present

## 2019-11-24 DIAGNOSIS — R7989 Other specified abnormal findings of blood chemistry: Secondary | ICD-10-CM | POA: Diagnosis not present

## 2019-11-24 LAB — T4, FREE: Free T4: 0.82 ng/dL (ref 0.60–1.60)

## 2019-11-24 LAB — TSH: TSH: 3.03 u[IU]/mL (ref 0.35–4.50)

## 2019-11-24 NOTE — Therapy (Signed)
[address] [place], [place], [address] Phone: [phone]   Fax:  [phone]  Physical Therapy Treatment  Patient Details  Name: Melanie Jordan MRN: [id number] Date of Birth: 09/16/1991 Referring Provider (PT): Vertell Novak Date: 11/24/2019  PT End of Session - 11/24/19 1015    Visit Number  4    Date for PT Re-Evaluation  01/09/20    PT Start Time  0933    PT Stop Time  1026    PT Time Calculation (min)  53 min    Activity Tolerance  Patient limited by pain    Behavior During Therapy  [facility] for tasks assessed/performed       Past Medical History:  Diagnosis Date  . ADHD    now off Adderal   . Allergy   . Anxiety   . Chronic head pain    taking Metoprolol  . GERD (gastroesophageal reflux disease)   . IBS (irritable bowel syndrome)     Past Surgical History:  Procedure Laterality Date  . ESOPHAGEAL MANOMETRY N/A 10/13/2019   Procedure: ESOPHAGEAL MANOMETRY (EM);  Surgeon: Mauri Pole, MD;  Location: [facility];  Service: Endoscopy;  Laterality: N/A;    There were no vitals filed for this visit.  Subjective Assessment - 11/24/19 1007    Subjective  Patient reports that she is dizzy this morning, unsure of a cause, reports that the neck has been feeling better    Currently in Pain?  Yes    Pain Score  4     Pain Location  Neck    Pain Relieving Factors  treatment seems to help             Vestibular Assessment - 11/24/19 0001      Positional Testing   Dix-Hallpike  Dix-Hallpike Right;Dix-Hallpike Left      Dix-Hallpike Right   Dix-Hallpike Right Duration  no symptoms    Dix-Hallpike Right Symptoms  No nystagmus      Dix-Hallpike Left   Dix-Hallpike Left Duration  no symptoms    Dix-Hallpike Left Symptoms  No nystagmus               [facility] Adult PT Treatment/Exercise - 11/24/19 0001      Moist Heat Therapy   Number Minutes Moist Heat  12 Minutes     Moist Heat Location  Cervical      Electrical Stimulation   Electrical Stimulation Location  cervical area    Electrical Stimulation Action  IFC    Electrical Stimulation Parameters  supine    Electrical Stimulation Goals  Pain      Manual Therapy   Manual Therapy  Soft tissue mobilization;Manual Traction    Soft tissue mobilization  to the upper traps, the rhomboids and the cervical paraspinals    Manual Traction  gentle occipital release and light traction, some gentle passive stretch of the neck with shoulder depression to get stretch of the levators             PT Education - 11/24/19 1010    Education Details  went over the VOR exercises, she does have some diffiuclty with saccades, we also went over Brandt-daroff exercises    Person(s) Educated  Patient    Methods  Explanation;Demonstration;Handout    Comprehension  Verbalized understanding       PT Short Term Goals - 11/16/19 1713      PT SHORT TERM GOAL #1  Title  indepednent with initial HEP    Status  Partially Met        PT Long Term Goals - 11/24/19 1017      PT LONG TERM GOAL #1   Title  report HA decreased 50%    Status  Partially Met      PT LONG TERM GOAL #2   Title  report dizziness decreased 50%    Status  On-going            Plan - 11/24/19 1015    Clinical Impression Statement  Patient with increased dizziness today, having less neck pain, tried dix-hallpike on both sides this was negative as it did not increase symptoms or any nystagmus.  I gave more VOR and Brandt-Daroff exercises, told her to go slow and it may cause some dizziness    PT Next Visit Plan  may try dry needling, may need to look at VOR, exercises    Consulted and Agree with Plan of Care  Patient       Patient will benefit from skilled therapeutic intervention in order to improve the following deficits and impairments:  Dizziness, Pain, Postural dysfunction, Decreased activity tolerance, Decreased range of  motion  Visit Diagnosis: Dizziness and giddiness  Acute intractable tension-type headache  Other muscle spasm  Cervicalgia     Problem List Patient Active Problem List   Diagnosis Date Noted  . Dysphagia   . Anterior chest wall pain 08/12/2019  . Dizziness on standing 05/24/2019  . Rapid palpitations 05/24/2019  . Adjustment disorder with anxious mood 04/02/2019  . Initiation of oral contraception 09/21/2018    Sumner Boast., PT 11/24/2019, 10:18 AM  [address] [place], [place], [address] Phone: [phone]   Fax:  [phone]  Name: KINGA CASSAR MRN: [id number] Date of Birth: 11-18-1991

## 2019-11-25 ENCOUNTER — Encounter: Payer: Self-pay | Admitting: Family Medicine

## 2019-11-25 LAB — T3: T3, Total: 162 ng/dL (ref 76–181)

## 2019-11-29 ENCOUNTER — Telehealth: Payer: Self-pay | Admitting: Psychiatry

## 2019-11-29 ENCOUNTER — Encounter: Payer: 59 | Admitting: Physical Therapy

## 2019-11-29 NOTE — Telephone Encounter (Signed)
Rtc to patient and she said she felt the same sx's with the 5 mg, recommend she go ahead and decrease to 5 mg for 7 days then okay to stop. Has apt 12/13/2019 can discuss options then and she agreed.

## 2019-11-29 NOTE — Telephone Encounter (Signed)
Patient called and said that the viibryd she is taking she doesn't like it. It makes her feel distant and disconnected. She wants to know does she need to taper off the medicine or can she just stop it. Please give her a call at [phone]

## 2019-12-01 ENCOUNTER — Encounter: Payer: 59 | Admitting: Physical Therapy

## 2019-12-08 ENCOUNTER — Ambulatory Visit (INDEPENDENT_AMBULATORY_CARE_PROVIDER_SITE_OTHER): Payer: 59 | Admitting: Psychiatry

## 2019-12-08 ENCOUNTER — Other Ambulatory Visit: Payer: Self-pay

## 2019-12-08 DIAGNOSIS — F411 Generalized anxiety disorder: Secondary | ICD-10-CM

## 2019-12-08 NOTE — Progress Notes (Signed)
[facility] Counselor/Therapist Progress Note  Patient ID: Melanie Jordan, MRN: [id number],    Date: 12/08/2019  Time Spent: 60 minutes  10:00am to 11:00am  Treatment Type: Individual Therapy  Reported Symptoms: anxiety  Mental Status Exam:  Appearance:   Neat     Behavior:  Appropriate and Sharing  Motor:  Normal  Speech/Language:   Normal Rate  Affect:  anxious  Mood:  anxious  Thought process:  normal  Thought content:    WNL  Sensory/Perceptual disturbances:    WNL  Orientation:  oriented to person, place, time/date, situation, day of week, month of year and year  Attention:  sometimes good and sometimes get distracted-Fair  Concentration:  Fair and she states her phone gets in the way  Memory:  [place] of knowledge:   Good  Insight:    Good  Judgment:   Good  Impulse Control:  Good   Risk Assessment: Danger to Self:  No Self-injurious Behavior: No Danger to Others: No Duty to Warn:no Physical Aggression / Violence:No  Access to Firearms a concern: No  Gang Involvement:No   Subjective: Patient in today reporting anxiety continues especially due to some of her health issues (vertigo, occasional racing heart).  She is being evaluated for her physical symptoms.  Also worried about ongoing pandemic and the violence and problems in the country and whole world.     Interventions: Solution-Oriented/Positive Psychology and Ego-Supportive  Diagnosis:   ICD-10-CM   1. Generalized anxiety disorder  F41.1     Plan of Care: Patient not signing Tx Plan on computer screen due to COVID-19.  Treatment Goals: Goals will remain on tx plan as patient works on strategies to achieve her goals. Progress will be noted each session in "Progress" section of the Plan.  Long term goal: Reduce overall level, frequency, and intensity of the anxiety so that daily functioning is not impaired.  Short term goal: Verbalize an understanding of the role that fearful  thinking plays in creating fears, excessive worry, and persistent anxiety symptoms.  Strategy: Identify, challenge, and replace fearful self-talk with positive, realistic, and empowering self-talk.  Progress: Patient shared today that she has had not episodes of panic since last appt.  Headaches have decreased some. Vertigo is still happening episodically on most days but is able to manage it some better.  Is being evaluated for her medical issues through her health care providers.  Pervasive negative/anxious thoughts have calmed down some since last appt.  Stated it does feel her anxiety "just comes over me and I try to fight it back".  States she doesn't know what her anxiety is about most times and I challenged her to look at her thoughts since thoughts lead to our feelings, and she agreed to start that process. Reviewed the strategy of catching and changing of anxious/negative thoughts to more positive, reality-based, and empowering thoughts that do not support anxiety. Offered her examples as well and this was helpful to her.  Is gaining some self-confidence in working on her anxiety and managing it some better. Having more hope that she is getting better "because I had no hope originally." Also helped her focus on "Staying in the Present" versus the past or the future. Difficulty more with not jumping into the future, and is feeling confident to try this change of focus. Reviewed homework re: Subjective Units of Distress and her rating were more positive/higher than previous 2 weeks.  Encouraged by this. To also put  her phone away 20-30 minutes before trying to go to sleep, for better timing in falling asleep. Goal review and progress noted with patient.  Next appt within 2 weeks.     Shanon Ace, LCSW

## 2019-12-09 DIAGNOSIS — E538 Deficiency of other specified B group vitamins: Secondary | ICD-10-CM | POA: Diagnosis not present

## 2019-12-13 ENCOUNTER — Encounter: Payer: Self-pay | Admitting: Psychiatry

## 2019-12-13 ENCOUNTER — Ambulatory Visit (INDEPENDENT_AMBULATORY_CARE_PROVIDER_SITE_OTHER): Payer: 59 | Admitting: Psychiatry

## 2019-12-13 VITALS — HR 72

## 2019-12-13 DIAGNOSIS — F411 Generalized anxiety disorder: Secondary | ICD-10-CM

## 2019-12-13 MED ORDER — FLUOXETINE HCL 10 MG PO CAPS: 10.0000 mg | ORAL_CAPSULE | Freq: Every day | ORAL | 1 refills | Status: DC

## 2019-12-13 MED FILL — FLUoxetine HCL 10 MG CAPS: 10 | 30 days supply | Qty: 30 | Fill #0

## 2019-12-13 NOTE — Progress Notes (Signed)
Cabela Gummer Hocevar [id number] 11-04-1991 29 y.o.  Virtual Visit via Telephone Note  I connected with pt on 12/13/19 at 10:30 AM EST by telephone and verified that I am speaking with the correct person using two identifiers.   I discussed the limitations, risks, security and privacy concerns of performing an evaluation and management service by telephone and the availability of in person appointments. I also discussed with the patient that there may be a patient responsible charge related to this service. The patient expressed understanding and agreed to proceed.   I discussed the assessment and treatment plan with the patient. The patient was provided an opportunity to ask questions and all were answered. The patient agreed with the plan and demonstrated an understanding of the instructions.   The patient was advised to call back or seek an in-person evaluation if the symptoms worsen or if the condition fails to improve as anticipated.  I provided 30 minutes of non-face-to-face time during this encounter.  The patient was located at home.  The provider was located at [place].   Thayer Headings, PMHNP   Subjective:   Patient ID:  Melanie Jordan is a 29 y.o. (DOB Sep 22, 1991) female.  Chief Complaint:  Chief Complaint  Patient presents with  . Anxiety    HPI Melanie Jordan presents for follow-up of anxiety and depression. Reports that she had increased feelings of derealization and it was worse with Viibryd, which was why she stopped Viibryd. She reports that she occasionally does not recognize her surroundings and people that she knows well. She reports that there was one incident where she briefly did not recognize her boyfriend of 12 years. She reports that she has been having difficulty with memory and at times does not remember driving to her destination. Has been having difficulty with concentration and focus, such as following conversations.   She reports that  her anxiety has improved. Reports that anxiety thoughts has been less and are more easily re-directed. She reports that she had a panic attack on Christmas eve. Palpitations mainly occurred around Christmas and occasionally at night. Reports that her mood has been ok. Denies significant depression. She reports that sleep has been ok, aside from not sleeping after panic attack. Appetite has been good. Energy and motivation have been low. Denies SI.   Reports that she has not taken Xanax prn recently.   Past Psychiatric Medication Trials: Nortriptyline- Caused depression and SI. Effexor- "Felt not connected to body." Felt fearful. Prozac- Felt jittery but less anxious. Viibryd- worsening derealization Buspar- "felt like I was drunk." Wellbutrin- had frequent tearfulness Adderall- Had increased HR Mydayis- Best tolerted Xanax- Effective for panic attacks  Review of Systems:  Review of Systems  Cardiovascular: Positive for palpitations.  Gastrointestinal:       IBS- Reports that s/s are consistent with baseline  Musculoskeletal: Negative for gait problem.  Neurological: Positive for headaches. Negative for tremors.       Migraines and vertigo daily  Psychiatric/Behavioral:       Please refer to HPI    Medications: I have reviewed the patient's current medications.  Current Outpatient Medications  Medication Sig Dispense Refill  . Acetaminophen (TYLENOL PO) Take by mouth as needed.    . Cholecalciferol (VITAMIN D3) 25 MCG (1000 UT) CAPS Take 1 capsule by mouth daily.    Lenda Kelp FE 1/20 1-20 MG-MCG tablet TAKE 1 TABLET BY MOUTH EVERY DAY 3 Package 3  . metoprolol succinate (TOPROL-XL) 50 MG 24 hr  tablet Take 1 tablet by mouth at bedtime.    . Multiple Vitamins-Minerals (MULTIVITAMIN ADULTS PO) Take by mouth daily.    . vitamin B-12 (CYANOCOBALAMIN) 1000 MCG tablet Take 1,000 mcg by mouth daily.    Marland Kitchen ALPRAZolam (XANAX) 0.25 MG tablet Take 1 tablet (0.25 mg total) by mouth daily as  needed for anxiety. (Patient not taking: Reported on 11/01/2019) 15 tablet 0  . Cetirizine HCl (ZYRTEC PO) Take by mouth daily as needed.     Marland Kitchen FAMOTIDINE PO Take by mouth.    Marland Kitchen FLUoxetine (PROZAC) 10 MG capsule Take 1 capsule (10 mg total) by mouth daily. 30 capsule 1  . hyoscyamine (LEVSIN SL) 0.125 MG SL tablet Place 1 tablet (0.125 mg total) under the tongue every 4 (four) hours as needed. (Patient not taking: Reported on 12/13/2019) 60 tablet 3  . promethazine (PHENERGAN) 25 MG tablet TAKE 1 TABLET (25 MG TOTAL) BY MOUTH EVERY 8 (EIGHT) HOURS AS NEEDED FOR NAUSEA FOR UP TO 90 DAYS    . SUMAtriptan (IMITREX) 25 MG tablet Take 1 tablet (25 mg total) by mouth every 2 (two) hours as needed for migraine. No more than 100mg  in 24hrs (Patient not taking: Reported on 12/13/2019) 10 tablet 0   No current facility-administered medications for this visit.    Medication Side Effects: Other: N/A  Allergies: No Known Allergies  Past Medical History:  Diagnosis Date  . ADHD    now off Adderal   . Allergy   . Anxiety   . Chronic head pain    taking Metoprolol  . GERD (gastroesophageal reflux disease)   . IBS (irritable bowel syndrome)     Family History  Problem Relation Age of Onset  . Cancer Mother 30       cervical and stomach cancer  . Fibroids Mother        uterine fibroid  . Thyroid disease Mother        hypothyroidism due to [place]  . Osteopenia Mother   . GER disease Mother   . Drug abuse Mother   . Stomach cancer Mother   . Anxiety disorder Mother   . Asthma Brother   . Anxiety disorder Brother   . Irritable bowel syndrome Brother   . Arthritis Maternal Grandmother   . Depression Maternal Grandmother   . Heart disease Maternal Grandmother 63       CAD with stents  . Hyperlipidemia Maternal Grandmother   . Hypertension Maternal Grandmother   . Stroke Maternal Grandmother 72       TIA  . Cancer Paternal Grandmother 67       uterine cancer  . Asthma Brother   .  Anxiety disorder Sister   . Depression Father   . ADD / ADHD Maternal Uncle   . Colon cancer Neg Hx   . Esophageal cancer Neg Hx   . Rectal cancer Neg Hx     Social History   Socioeconomic History  . Marital status: Single    Spouse name: Not on file  . Number of children: 0  . Years of education: Not on file  . Highest education level: Associate degree: occupational, Hotel manager, or vocational program  Occupational History  . 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 Smoking status: Never Smoker  . Smokeless tobacco: Never Used  Substance and Sexual Activity  . Alcohol use: Not Currently    Comment: social  . Drug  use: Never  . Sexual activity: Yes    Birth control/protection: Pill  Other Topics Concern  . Not on file  Social History Narrative   Engaged.      Started drinking more during the day - up to ~2 L (since onset of dizziness).      NO routine exercise.      Works in [employer] @ [employer] Strain:   . Difficulty of Paying Living Expenses: Not on file  Food Insecurity:   . Worried About Charity fundraiser in the Last Year: Not on file  . Ran Out of Food in the Last Year: Not on file  Transportation Needs:   . Lack of Transportation (Medical): Not on file  . Lack of Transportation (Non-Medical): Not on file  Physical Activity:   . Days of Exercise per Week: Not on file  . Minutes of Exercise per Session: Not on file  Stress:   . Feeling of Stress : Not on file  Social Connections:   . Frequency of Communication with Friends and Family: Not on file  . Frequency of Social Gatherings with Friends and Family: Not on file  . Attends Religious Services: Not on file  . Active Member of Clubs or Organizations: Not on file  . Attends Archivist Meetings: Not on file  . Marital Status: Not on file  Intimate Partner Violence:   . Fear of Current or  Ex-Partner: Not on file  . Emotionally Abused: Not on file  . Physically Abused: Not on file  . Sexually Abused: Not on file    Past Medical History, Surgical history, Social history, and Family history were reviewed and updated as appropriate.   Please see review of systems for further details on the patient's review from today.   Objective:   Physical Exam:  Pulse 72   Physical Exam Neurological:     Mental Status: She is alert and oriented to person, place, and time.     Cranial Nerves: No dysarthria.  Psychiatric:        Attention and Perception: Attention and perception normal.        Mood and Affect: Mood is anxious.        Speech: Speech normal.        Behavior: Behavior is cooperative.        Thought Content: Thought content normal. Thought content is not paranoid or delusional. Thought content does not include homicidal or suicidal ideation. Thought content does not include homicidal or suicidal plan.        Cognition and Memory: Cognition and memory normal.        Judgment: Judgment normal.     Comments: Insight intact     Lab Review:     Component Value Date/Time   NA 136 08/03/2019 1130   K 3.9 10/27/2019 1623   CL 102 08/03/2019 1130   CO2 24 08/03/2019 1130   GLUCOSE 98 08/03/2019 1130   BUN 8 08/03/2019 1130   CREATININE 0.85 08/03/2019 1130   CREATININE 0.73 04/12/2019 1450   CALCIUM 9.1 08/03/2019 1130   PROT 7.6 08/03/2019 1130   ALBUMIN 4.5 08/03/2019 1130   AST 15 08/03/2019 1130   ALT 15 08/03/2019 1130   ALKPHOS 36 (L) 08/03/2019 1130   BILITOT 0.6 08/03/2019 1130   GFRNONAA >60 08/03/2019 1130   GFRAA >60 08/03/2019 1130       Component Value Date/Time  WBC 6.9 08/03/2019 1130   RBC 4.59 08/03/2019 1130   HGB 14.0 08/03/2019 1130   HCT 43.1 08/03/2019 1130   PLT 281 08/03/2019 1130   MCV 93.9 08/03/2019 1130   MCH 30.5 08/03/2019 1130   MCHC 32.5 08/03/2019 1130   RDW 12.2 08/03/2019 1130   LYMPHSABS 2.6 08/03/2019 1130    MONOABS 0.4 08/03/2019 1130   EOSABS 0.1 08/03/2019 1130   BASOSABS 0.0 08/03/2019 1130    No results found for: POCLITH, LITHIUM   No results found for: PHENYTOIN, PHENOBARB, VALPROATE, CBMZ   .res Assessment: Plan:   Pt seen for 30 minutes and time spent discussing possible tx options and reviewing results of pharmacogenetic testing. Pt reports that she recalls experiencing some improvement in anxiety s/swith some jitteriness in the past with Prozac and would consider re-trial of Prozac. Discussed starting Prozac at 10 mg po qd to minimize risk of side effects. Discussed that jitteriness can sometimes occur after initiation of an SSRI and often improves after about 10 days and that if jitteriness is tolerable to try to continue for 10 days-2 weeks. Discussed stopping Prozac and contacting office if jitteriness is severe/intolerable.  Will start Prozac 10 mg po qd for anxiety.  Recommend continuing psychotherapy with Rinaldo Cloud, LCSW. Pt to f/u with this provider in 4 weeks or sooner if clinically indicated.  Patient advised to contact office with any questions, adverse effects, or acute worsening in signs and symptoms.  Avianna was seen today for anxiety.  Diagnoses and all orders for this visit:  Generalized anxiety disorder -     FLUoxetine (PROZAC) 10 MG capsule; Take 1 capsule (10 mg total) by mouth daily.    Please see After Visit Summary for patient specific instructions.  Future Appointments  Date Time Provider [place]  12/22/2019 10:00 AM Shanon Ace, LCSW [facility] None  01/05/2020 10:00 AM Shanon Ace, LCSW [facility] None  01/19/2020 10:00 AM Shanon Ace, LCSW [facility] None  02/02/2020 10:00 AM Shanon Ace, LCSW [facility] None  02/16/2020 10:00 AM Shanon Ace, LCSW [facility] None    No orders of the defined types were placed in this encounter.     -------------------------------

## 2019-12-16 MED FILL — METOPROLOL SUCCINATE ER 50: 50 | 90 days supply | Qty: 90 | Fill #2

## 2019-12-22 ENCOUNTER — Ambulatory Visit (INDEPENDENT_AMBULATORY_CARE_PROVIDER_SITE_OTHER): Payer: 59 | Admitting: Psychiatry

## 2019-12-22 ENCOUNTER — Other Ambulatory Visit: Payer: Self-pay

## 2019-12-22 DIAGNOSIS — F411 Generalized anxiety disorder: Secondary | ICD-10-CM

## 2019-12-22 NOTE — Progress Notes (Signed)
      [facility] Counselor/Therapist Progress Note  Patient ID: Melanie Jordan, MRN: [id number],    Date: 12/22/2019  Time Spent: 60 minutes  10:00am to 11:00am  Treatment Type: Individual Therapy  Reported Symptoms: anxiety decreased some, depressed, sad and tearful, has not taken the Prozac due to fears it may cause headaches "and I've already been having headaches."  Mental Status Exam:  Appearance:   Casual     Behavior:  Appropriate and Sharing  Motor:  Normal  Speech/Language:   Normal Rate  Affect:  anxious, fearful, sad  Mood:  anxious, depressed and sad  Thought process:  goal directed  Thought content:    WNL  Sensory/Perceptual disturbances:    WNL  Orientation:  oriented to person, place, time/date, situation, day of week, month of year and year  Attention:  Good  Concentration:  Good  Memory:  WNL  Fund of knowledge:   Good  Insight:    Good  Judgment:   Good  Impulse Control:  Good   Risk Assessment: Danger to Self:  No Self-injurious Behavior: No Danger to Others: No Duty to Warn:no Physical Aggression / Violence:No  Access to Firearms a concern: No  Gang Involvement:No   Subjective: Patient today struggling with anxiety although she says it was decreased earlier in week.  Lots of negative/anxious thoughts about health issues that have been diagnosed as migraines and she is being treated for this.     Interventions: Cognitive Behavioral Therapy and Solution-Oriented/Positive Psychology  Diagnosis:   ICD-10-CM   1. Generalized anxiety disorder  F41.1     Plan of Care: Patient not signing Tx Plan on computer screen due to COVID-19.  Treatment Goals: Goals will remain on tx plan as patient works on strategies to achieve her goals. Progress will be noted each session in "Progress" section of the Plan.  Long term goal: Reduce overall level, frequency, and intensity of the anxiety so that daily functioning is not impaired.  Short term  goal: Verbalize an understanding of the role that fearful thinking plays in creating fears, excessive worry, and persistent anxiety symptoms.  Strategy: Identify, challenge, and replace fearful self-talk with positive, realistic, and empowering self-talk.  Progress: Only one episode of panic since last appt and patient states that was very brief. Tearful and anxious today.  Denies andy SI. States she didn't start taking her Prozac as she thought it may cause headaches for her. Shares that she took Prozac in 2018 and did not have headaches with it. Looked at how her anxious/less hopeful thoughts are quite powerful and have been having lot of control in how anxious, fearful, and depressed she has felt. Processed this more and her tearfulness eventually subsided and she reported feeling some better. Worked on replacing those anxious/less hopeful thoughts to more hopeful, reality-based, and empowering thoughts that do not support anxiety, negativity, fearfulness, nor depression.  To begin taking her Prozac as prescribed starting with morning dose tomorrow.  Use the suggestions made today which she wrote in her journal.  Urged more positive self talk as well. Goal review and progress/challenges noted with patient.    Next appt within 2 weeks.  Shanon Ace, LCSW

## 2019-12-23 DIAGNOSIS — G43809 Other migraine, not intractable, without status migrainosus: Secondary | ICD-10-CM | POA: Diagnosis not present

## 2019-12-24 NOTE — Telephone Encounter (Signed)
noted 

## 2019-12-28 ENCOUNTER — Other Ambulatory Visit: Payer: Self-pay

## 2019-12-29 ENCOUNTER — Ambulatory Visit (INDEPENDENT_AMBULATORY_CARE_PROVIDER_SITE_OTHER): Payer: 59 | Admitting: Family Medicine

## 2019-12-29 ENCOUNTER — Encounter: Payer: Self-pay | Admitting: Family Medicine

## 2019-12-29 VITALS — BP 100/80 | HR 58 | Temp 97.4°F | Ht 67.0 in | Wt 158.8 lb

## 2019-12-29 DIAGNOSIS — B9689 Other specified bacterial agents as the cause of diseases classified elsewhere: Secondary | ICD-10-CM

## 2019-12-29 DIAGNOSIS — R300 Dysuria: Secondary | ICD-10-CM

## 2019-12-29 DIAGNOSIS — N760 Acute vaginitis: Secondary | ICD-10-CM | POA: Diagnosis not present

## 2019-12-29 DIAGNOSIS — M2480 Other specific joint derangements of unspecified joint, not elsewhere classified: Secondary | ICD-10-CM | POA: Diagnosis not present

## 2019-12-29 LAB — POCT URINALYSIS DIPSTICK
Bilirubin, UA: NEGATIVE
Blood, UA: NEGATIVE
Glucose, UA: NEGATIVE
Ketones, UA: NEGATIVE
Nitrite, UA: NEGATIVE
Protein, UA: POSITIVE — AB
Spec Grav, UA: 1.02 (ref 1.010–1.025)
Urobilinogen, UA: 1 E.U./dL
pH, UA: 6.5 (ref 5.0–8.0)

## 2019-12-29 MED ORDER — METRONIDAZOLE 500 MG PO TABS: 500.0000 mg | ORAL_TABLET | Freq: Two times a day (BID) | ORAL | 0 refills | Status: DC

## 2019-12-29 MED FILL — METRONIDAZOLE 500 MG TABS: 500 | 7 days supply | Qty: 14 | Fill #0

## 2019-12-29 NOTE — Progress Notes (Signed)
Melanie Jordan is a 29 y.o. female  Chief Complaint  Patient presents with  . Urinary Tract Infection    Pt c/o burning after urinate x 1 week,sharp pain in rt side x 1 month.    HPI: Melanie Jordan is a 29 y.o. female who complains of 1 week h/o burning after urination, urgency. No frequency.  No fever, chills, n/v, gross hematuria. She endorses an intermittent quick sharp pain in her Rt side x 1 mo. No back pain. + vaginal itching, odor, discharge x 2 weeks. Discharge is white, thin. No unexplained/abnormal vaginal bleeding. No abdominal pain.   Pt also states she is hypermobile in multiple joints and often gets pain in elbows. She also notes dislocation of her patella. She is seeing PT who recommended specialist eval.  Past Medical History:  Diagnosis Date  . ADHD    now off Adderal   . Allergy   . Anxiety   . Chronic head pain    taking Metoprolol  . GERD (gastroesophageal reflux disease)   . IBS (irritable bowel syndrome)     Past Surgical History:  Procedure Laterality Date  . ESOPHAGEAL MANOMETRY N/A 10/13/2019   Procedure: ESOPHAGEAL MANOMETRY (EM);  Surgeon: Mauri Pole, MD;  Location: [facility];  Service: Endoscopy;  Laterality: N/A;    Social History   Socioeconomic History  . Marital status: Single    Spouse name: Not on file  . Number of children: 0  . Years of education: Not on file  . Highest education level: Associate degree: occupational, Hotel manager, or vocational program  Occupational History  . 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 Smoking status: Never Smoker  . Smokeless tobacco: Never Used  Substance and Sexual Activity  . Alcohol use: Not Currently    Comment: social  . Drug use: Never  . Sexual activity: Yes    Birth control/protection: Pill  Other Topics Concern  . Not on file  Social History Narrative   Engaged.      Started drinking more during the day - up to ~2 L  (since onset of dizziness).      NO routine exercise.      Works in [employer] @ [employer] Strain:   . Difficulty of Paying Living Expenses: Not on file  Food Insecurity:   . Worried About Charity fundraiser in the Last Year: Not on file  . Ran Out of Food in the Last Year: Not on file  Transportation Needs:   . Lack of Transportation (Medical): Not on file  . Lack of Transportation (Non-Medical): Not on file  Physical Activity:   . Days of Exercise per Week: Not on file  . Minutes of Exercise per Session: Not on file  Stress:   . Feeling of Stress : Not on file  Social Connections:   . Frequency of Communication with Friends and Family: Not on file  . Frequency of Social Gatherings with Friends and Family: Not on file  . Attends Religious Services: Not on file  . Active Member of Clubs or Organizations: Not on file  . Attends Archivist Meetings: Not on file  . Marital Status: Not on file  Intimate Partner Violence:   . Fear of Current or Ex-Partner: Not on file  . Emotionally Abused: Not on file  . Physically Abused: Not on  file  . Sexually Abused: Not on file    Family History  Problem Relation Age of Onset  . Cancer Mother 30       cervical and stomach cancer  . Fibroids Mother        uterine fibroid  . Thyroid disease Mother        hypothyroidism due to [place]  . Osteopenia Mother   . GER disease Mother   . Drug abuse Mother   . Stomach cancer Mother   . Anxiety disorder Mother   . Asthma Brother   . Anxiety disorder Brother   . Irritable bowel syndrome Brother   . Arthritis Maternal Grandmother   . Depression Maternal Grandmother   . Heart disease Maternal Grandmother 63       CAD with stents  . Hyperlipidemia Maternal Grandmother   . Hypertension Maternal Grandmother   . Stroke Maternal Grandmother 72       TIA  . Cancer Paternal Grandmother 62       uterine cancer  . Asthma Brother     . Anxiety disorder Sister   . Depression Father   . ADD / ADHD Maternal Uncle   . Colon cancer Neg Hx   . Esophageal cancer Neg Hx   . Rectal cancer Neg Hx      Immunization History  Administered Date(s) Administered  . Hpv 05/26/2015  . Influenza,inj,Quad PF,6+ Mos 08/06/2018  . Influenza-Unspecified 08/15/2019  . Tdap 05/26/2015    Outpatient Encounter Medications as of 12/29/2019  Medication Sig  . Acetaminophen (TYLENOL PO) Take by mouth as needed.  . ALPRAZolam (XANAX) 0.25 MG tablet Take 1 tablet (0.25 mg total) by mouth daily as needed for anxiety.  . Cetirizine HCl (ZYRTEC PO) Take by mouth daily as needed.   . Cholecalciferol (VITAMIN D3) 25 MCG (1000 UT) CAPS Take 1 capsule by mouth daily.  Marland Kitchen FAMOTIDINE PO Take by mouth.  Marland Kitchen FLUoxetine (PROZAC) 10 MG capsule Take 1 capsule (10 mg total) by mouth daily.  . hyoscyamine (LEVSIN SL) 0.125 MG SL tablet Place 1 tablet (0.125 mg total) under the tongue every 4 (four) hours as needed.  Lenda Kelp FE 1/20 1-20 MG-MCG tablet TAKE 1 TABLET BY MOUTH EVERY DAY  . metoprolol succinate (TOPROL-XL) 50 MG 24 hr tablet Take 1 tablet by mouth at bedtime.  . Multiple Vitamins-Minerals (MULTIVITAMIN ADULTS PO) Take by mouth daily.  . promethazine (PHENERGAN) 25 MG tablet TAKE 1 TABLET (25 MG TOTAL) BY MOUTH EVERY 8 (EIGHT) HOURS AS NEEDED FOR NAUSEA FOR UP TO 90 DAYS  . vitamin B-12 (CYANOCOBALAMIN) 1000 MCG tablet Take 1,000 mcg by mouth daily.  . SUMAtriptan (IMITREX) 25 MG tablet Take 1 tablet (25 mg total) by mouth every 2 (two) hours as needed for migraine. No more than 100mg  in 24hrs (Patient not taking: Reported on 12/13/2019)   No facility-administered encounter medications on file as of 12/29/2019.     ROS: Pertinent positives and negatives noted in HPI. Remainder of ROS non-contributory    No Known Allergies  BP 100/80 (BP Location: Right Arm, Patient Position: Sitting, Cuff Size: Normal)   Pulse (!) 58   Temp (!) 97.4 F (36.3  C) (Temporal)   Ht 5\' 7"  (1.702 m)   Wt 158 lb 12.8 oz (72 kg)   LMP 12/01/2019   SpO2 99%   BMI 24.87 kg/m   Physical Exam  Constitutional: She is oriented to person, place, and time. She appears well-developed and well-nourished. No  distress.  Abdominal: Soft. Bowel sounds are normal. She exhibits no distension. There is no abdominal tenderness. There is no rebound, no guarding and no CVA tenderness.  Neurological: She is alert and oriented to person, place, and time.  Skin: Skin is warm and dry.  Psychiatric: She has a normal mood and affect. Her behavior is normal.   Component     Latest Ref Rng & Units 12/29/2019  Color, UA      yellow  Clarity, UA      cloudy  Glucose     Negative Negative  Bilirubin, UA      negative  Ketones, UA      negative  Specific Gravity, UA     1.010 - 1.025 1.020  RBC, UA      negative  pH, UA     5.0 - 8.0 6.5  Protein,UA     Negative Positive (A)  Urobilinogen, UA     0.2 or 1.0 E.U./dL 1.0  Nitrite, UA      negative  Leukocytes,UA     Negative 4+ (A)  Appearance     CLEAR     A/P:  1. Dysuria - POCT Urinalysis Dipstick  2. BV (bacterial vaginosis) Rx: - metroNIDAZOLE (FLAGYL) 500 MG tablet; Take 1 tablet (500 mg total) by mouth 2 (two) times daily for 7 days.  Dispense: 14 tablet; Refill: 0 - pt to f/u if symptoms worsen or do not improve with above - pt was not able to provide enough of a urine sample to send out for culture or sytology  3. Generalized hypermobility of joints - seeing PT who recommended eval - Ambulatory referral to Rheumatology  Discussed plan and reviewed medications with patient, including risks, benefits, and potential side effects. Pt expressed understand. All questions answered.   This visit occurred during the SARS-CoV-2 public health emergency.  Safety protocols were in place, including screening questions prior to the visit, additional usage of staff PPE, and extensive cleaning of exam room  while observing appropriate contact time as indicated for disinfecting solutions.

## 2019-12-30 ENCOUNTER — Other Ambulatory Visit: Payer: Self-pay | Admitting: Family Medicine

## 2019-12-30 ENCOUNTER — Encounter: Payer: Self-pay | Admitting: Family Medicine

## 2019-12-30 MED ORDER — METRONIDAZOLE 0.75 % VA GEL: 1.0000 | Freq: Two times a day (BID) | VAGINAL | 0 refills | Status: AC

## 2019-12-31 ENCOUNTER — Telehealth: Payer: Self-pay | Admitting: Psychiatry

## 2019-12-31 NOTE — Telephone Encounter (Signed)
Given instructions to stop Prozac, notify her medical provider if symptoms worsen. Advised her to call back next week with an update. She verbalized understanding and appreciative of call.

## 2019-12-31 NOTE — Telephone Encounter (Signed)
Patient called and said that the medication fluoxetine and the propanolol is making her heart rate drop to 45.Marland Kitchen She wanted to let you know. "Please let her know what you want her to do. Please  Call her at [phone]

## 2020-01-04 ENCOUNTER — Ambulatory Visit: Payer: 59 | Admitting: General Practice

## 2020-01-04 ENCOUNTER — Ambulatory Visit: Payer: 59 | Admitting: Physician Assistant

## 2020-01-05 ENCOUNTER — Ambulatory Visit (INDEPENDENT_AMBULATORY_CARE_PROVIDER_SITE_OTHER): Payer: 59 | Admitting: Psychiatry

## 2020-01-05 ENCOUNTER — Other Ambulatory Visit: Payer: Self-pay

## 2020-01-05 ENCOUNTER — Telehealth: Payer: Self-pay | Admitting: Cardiology

## 2020-01-05 DIAGNOSIS — F411 Generalized anxiety disorder: Secondary | ICD-10-CM

## 2020-01-05 NOTE — Telephone Encounter (Signed)
New Message   Patient is calling because she is scheduled to be seen on 01/06/20. She states that the appt was made because she was having a low HR and thought it was from a medication that she was taking. She has since stop the medication and is wondering does she need to keep the appt.

## 2020-01-05 NOTE — Progress Notes (Signed)
Cardiology Office Note   Date:  01/06/2020   ID:  Melanie Jordan, DOB 07-Dec-1990, MRN [id number]  PCP:  Ronnald Nian, DO  Cardiologist:  [name], MD EP: None  Chief Complaint  Patient presents with  . Palpitations      History of Present Illness: Melanie Jordan is a 29 y.o. female with PMH of palpitations, vestibular migraines, panic attacks, and GERD, who presents for follow-up of her palpitations.  She was last evaluated by cardiology at an outpatient visit with Dr. Ellyn Hack 08/09/2019, at which time she noted intermittent dizziness, chest pain, and palpitations. She was recommended to use tylenol/motrin for atypical, reproducible chest wall pain; stay well hydrated for dizziness; and continue monitoring symptoms with regards to palpitations given reassuring cardiac monitor. She subsequently contacted Dr. Ellyn Hack requesting a stress test. She underwent a exercise tolerance test 10/2019 which showed good exercise tolerance, no EKG changes to suggest ischemia, and no significant arrhythmias. More recently she sent a MyChart message with concerns for continue palpitations, intermittent dizziness, and bradycardia. She has been on metoprolol for management of migraines. She reported HR in the 40s at rest which increases to 50s-60s with movement.   She presents today for follow-up of her palpitations, dizziness, and bradycardia. The dizziness is unchanged and correlates with her vestibular migraines.She was recently seen by her neurologist and recommended to discontinue her metoprolol which was started for management of her migraines as her HR's were noted to be in the 40s-60s. She asks about stopping this medication. She continues to have palpitations - felt they were initially better for 2 weeks, then began occurring again. She states they are worse when she is talking and laying down at night. Suspect anxiety is playing a roll. She has occasional chest tightness with recent  negative POET. Pain is non-exertional. She has been working with a psychiatrist for management of her anxiety and recently stopped prozac due to concerns for possible drug interaction.     Past Medical History:  Diagnosis Date  . ADHD    now off Adderal   . Allergy   . Anxiety   . Chronic head pain    taking Metoprolol  . GERD (gastroesophageal reflux disease)   . IBS (irritable bowel syndrome)     Past Surgical History:  Procedure Laterality Date  . ESOPHAGEAL MANOMETRY N/A 10/13/2019   Procedure: ESOPHAGEAL MANOMETRY (EM);  Surgeon: Mauri Pole, MD;  Location: [facility];  Service: Endoscopy;  Laterality: N/A;     Current Outpatient Medications  Medication Sig Dispense Refill  . Acetaminophen (TYLENOL PO) Take by mouth as needed.    . ALPRAZolam (XANAX) 0.25 MG tablet Take 1 tablet (0.25 mg total) by mouth daily as needed for anxiety. 15 tablet 0  . Cetirizine HCl (ZYRTEC PO) Take by mouth daily as needed.     . Cholecalciferol (VITAMIN D3) 25 MCG (1000 UT) CAPS Take 1 capsule by mouth daily.    Marland Kitchen FAMOTIDINE PO Take by mouth.    . hyoscyamine (LEVSIN SL) 0.125 MG SL tablet Place 1 tablet (0.125 mg total) under the tongue every 4 (four) hours as needed. 60 tablet 3  . JUNEL FE 1/20 1-20 MG-MCG tablet TAKE 1 TABLET BY MOUTH EVERY DAY 3 Package 3  . Multiple Vitamins-Minerals (MULTIVITAMIN ADULTS PO) Take by mouth daily.    . promethazine (PHENERGAN) 25 MG tablet TAKE 1 TABLET (25 MG TOTAL) BY MOUTH EVERY 8 (EIGHT) HOURS AS NEEDED FOR NAUSEA FOR UP  TO 90 DAYS    . propranolol (INDERAL) 40 MG tablet Take 1 tablet (40 mg total) by mouth daily. 30 tablet 3  . vitamin B-12 (CYANOCOBALAMIN) 1000 MCG tablet Take 1,000 mcg by mouth daily.     No current facility-administered medications for this visit.    Allergies:   Patient has no known allergies.    Social History:  The patient  reports that she has never smoked. She has never used smokeless tobacco. She reports  previous alcohol use. She reports that she does not use drugs.   Family History:  The patient's family history includes ADD / ADHD in her maternal uncle; Anxiety disorder in her brother, mother, and sister; Arthritis in her maternal grandmother; Asthma in her brother and brother; Cancer (age of onset: 33) in her mother; Cancer (age of onset: 20) in her paternal grandmother; Depression in her father and maternal grandmother; Drug abuse in her mother; Fibroids in her mother; GER disease in her mother; Heart disease (age of onset: 52) in her maternal grandmother; Hyperlipidemia in her maternal grandmother; Hypertension in her maternal grandmother; Irritable bowel syndrome in her brother; Osteopenia in her mother; Stomach cancer in her mother; Stroke (age of onset: 83) in her maternal grandmother; Thyroid disease in her mother.    ROS:  Please see the history of present illness.   Otherwise, review of systems are positive for none.   All other systems are reviewed and negative.    PHYSICAL EXAM: VS:  BP 118/78   Pulse (!) 57   Temp 98.2 F (36.8 C)   Ht 5\' 7"  (1.702 m)   Wt 158 lb (71.7 kg)   SpO2 99%   BMI 24.75 kg/m  , BMI Body mass index is 24.75 kg/m. GEN: Well nourished, well developed, in no acute distress HEENT: sclera anicteric Neck: no JVD, carotid bruits, or masses Cardiac: RRR; no murmurs, rubs, or gallops,no edema  Respiratory:  clear to auscultation bilaterally, normal work of breathing GI: soft, nontender, nondistended, + BS MS: no deformity or atrophy Skin: warm and dry, no rash Neuro:  Strength and sensation are intact Psych: euthymic mood, full affect   EKG:  EKG is ordered today. The ekg ordered today demonstrates sinus bradycardia with sinus arrhythmia, rate 57 bpm, no STE/D, QTc 397; no significant change from previous   Recent Labs: 08/03/2019: ALT 15; BUN 8; Creatinine, Ser 0.85; Hemoglobin 14.0; Platelets 281; Sodium 136 09/23/2019: Magnesium 2.2 10/27/2019:  Potassium 3.9 11/24/2019: TSH 3.03    Lipid Panel    Component Value Date/Time   CHOL 125 09/21/2018 0950   TRIG 60.0 09/21/2018 0950   HDL 47.30 09/21/2018 0950   CHOLHDL 3 09/21/2018 0950   VLDL 12.0 09/21/2018 0950   LDLCALC 65 09/21/2018 0950      Wt Readings from Last 3 Encounters:  01/06/20 158 lb (71.7 kg)  12/29/19 158 lb 12.8 oz (72 kg)  09/23/19 155 lb (70.3 kg)      Other studies Reviewed: Additional studies/ records that were reviewed today include:   Exercise tolerance test 10/2019:  There was no ST segment deviation noted during stress.  The patient walked for 9 minutes and 13 seconds of a standard Bruce protocol treadmill test.  She achieved a peak heart rate of 173 which is 89% predicted maximal heart rate.  There were no ST or T wave changes to suggest  Ischemia. There were no significant arrhythmias.  Her blood pressure response to exercise was borderline hypertensive. There was  no QRS widening.  This is interpreted as a negative stress test. There is no evidence of ischemia. The patient has good exercise capacity.  Cardiac event monitor 06/2019:  Overall, majority of them was either sinus rhythm with minimal heart rate 43 bpm (sinus bradycardia) and maximum heart rate 166 bpm in sinus tachycardia.  Overall average heart rate 75 bpm. Sinus rhythm  Rare PACs and PVCs noted. (Less than 1%)  Over 90 patient triggered events documented, the majority were for fluttering, chest pain or dizziness. At most there was sinus rhythm with PACs or PVCs, mostly sinus rhythm or sinus tachycardia.   Grossly normal study with no arrhythmias or significant ectopy.  Symptoms noted with relatively normal rhythm.   ASSESSMENT AND PLAN:  1. Palpitations: ongoing. Likely driven by anxiety. Heart monitor revealed rare PACs/PVCs.  - Will stop metoprolol - Start propranolol 40mg  daily   2. Bradycardia: likely related to the metoprolol. Hr in the 40s at rest,  improves to 60s with activity. No pre-syncope or syncopal events.  - Can continue to monitor  3. Anxiety: likely driving symptoms from palpitations.  - Encouraged close follow-up with her psychiatrist to work on finding an alternative antianxiety medication.  - May get some antianxiety benefit with addition of propranolol   Current medicines are reviewed at length with the patient today.  The patient does not have concerns regarding medicines.  The following changes have been made:  As above  Labs/ tests ordered today include:   Orders Placed This Encounter  Procedures  . EKG 12-Lead     Disposition:   FU with me or Dr. Ellyn Hack in 2 months  Signed, Abigail Butts, PA-C  01/06/2020 6:12 PM

## 2020-01-05 NOTE — Telephone Encounter (Signed)
Called pt back. Stated that she stopped taking her prozac and continues to take metoprolol but has noticed that her HR was still been low. Advised pt to keep appt tomorrow with Roby Lofts, PA-C. Verbalized understanding.

## 2020-01-05 NOTE — Progress Notes (Signed)
[facility] Counselor/Therapist Progress Note  Patient ID: Melanie Jordan, MRN: [id number],    Date: 01/05/2020  Time Spent: 60 minutes   10:00am to 11:00am  Treatment Type: Individual Therapy  Reported Symptoms: anxiety  Mental Status Exam:  Appearance:   Casual     Behavior:  Appropriate and Sharing  Motor:  Normal  Speech/Language:   Normal Rate  Affect:  anxious  Mood:  anxious  Thought process:  goal directed  Thought content:    WNL  Sensory/Perceptual disturbances:    WNL  Orientation:  oriented to person, place, time/date, situation, day of week, month of year and year  Attention:  Good  Concentration:  Eek  Memory:  no problem reported  Fund of knowledge:   Good  Insight:    Good  Judgment:   Good  Impulse Control:  Good   Risk Assessment: Danger to Self:  No Self-injurious Behavior: No Danger to Others: No Duty to Warn:no Physical Aggression / Violence:No  Access to Firearms a concern: No  Gang Involvement:No   Subjective: Patient in today reporting anxiety and that "I feel better than I did last appt, haven't been crying as often, still feel a bit empty/depressed inside, but not having the depressive thoughts fearing that I won't get better."   Had panic attack since last appt when me and my fiance were arguing about how "I don't tell all of how I'm always doing emotionally". He comforted me during the panic attack and we began talking more effectively together." Recently on antibiotic that created some vertigo for her and she missed work last weekend.   Interventions: Cognitive Behavioral Therapy and Solution-Oriented/Positive Psychology  Diagnosis:   ICD-10-CM   1. Generalized anxiety disorder  F41.1      Plan of Care: Patient not signing Tx Plan on computer screen due to COVID-19.  Treatment Goals: Goals will remain on tx plan as patient works on strategies to achieve her goals. Progress will be noted each session in "Progress"  section of the Plan.  Long term goal: Reduce overall level, frequency, and intensity of the anxiety so that daily functioning is not impaired.  Short term goal: Verbalize an understanding of the role that fearful thinking plays in creating fears, excessive worry, and persistent anxiety symptoms.  Strategy: Identify, challenge, and replace fearful self-talk with positive, realistic, and empowering self-talk.  Progress: Patient doing better today with less depression and less anxiety. Did have some possible issues with the Prozax (light headedness, sweating and nausea) so she spoke with her med provider and is holding off for how and checking for possible effect on heart rate---adds that it could have been related to an antibiotic she was taking. Is also in touch with her cardiologist and neurologist. Still feels some low motivation at times and other times motivates herself to exercise and do things at home.  Is back to doing exercies video X4 weekly on her days off.  Not as fearful and depressed as previously reported. Notes that she tends to have difficulty with uncertainties, changes,  and with fluctuations in schedule. Had been better with her anxious thoughts until her physical symptoms noted above, which cause a return in anxious/fearful thinking. Shares that her PCP has told her previously "that I am healthy and I need to get my anxiety under control.", although acknowledges some physical issues such as the vertigo.that are medically based and not a result of her anxiety. Is working with her goal of better  understanding the role that fearful thinking plays in creating fears, excessive worry, and persistent anxiety symptoms. Worked on some breathing exercises and thought changing for her to use when needed with anxiety.To specifically focus on more positive self-talk and we shared examples in session today. Goal review and progress noted with patient.    Next appt within 2  weeks.   Shanon Ace, LCSW

## 2020-01-06 ENCOUNTER — Encounter: Payer: Self-pay | Admitting: Medical

## 2020-01-06 ENCOUNTER — Ambulatory Visit (INDEPENDENT_AMBULATORY_CARE_PROVIDER_SITE_OTHER): Payer: 59 | Admitting: Medical

## 2020-01-06 VITALS — BP 118/78 | HR 57 | Temp 98.2°F | Ht 67.0 in | Wt 158.0 lb

## 2020-01-06 DIAGNOSIS — R002 Palpitations: Secondary | ICD-10-CM

## 2020-01-06 DIAGNOSIS — F419 Anxiety disorder, unspecified: Secondary | ICD-10-CM | POA: Diagnosis not present

## 2020-01-06 DIAGNOSIS — G43809 Other migraine, not intractable, without status migrainosus: Secondary | ICD-10-CM

## 2020-01-06 MED ORDER — PROPRANOLOL HCL 40 MG PO TABS: 40.0000 mg | ORAL_TABLET | Freq: Every day | ORAL | 3 refills | Status: DC

## 2020-01-06 MED FILL — PROPRANOLOL 40 MG TABLET: 40 | 30 days supply | Qty: 30 | Fill #0

## 2020-01-06 NOTE — Patient Instructions (Signed)
Medication Instructions:  Stop Metoprolol  Start Propranolol 40 mg daily  *If you need a refill on your cardiac medications before your next appointment, please call your pharmacy*   Follow-Up: At [facility], you and your health needs are our priority.  As part of our continuing mission to provide you with exceptional heart care, we have created designated Provider Care Teams.  These Care Teams include your primary Cardiologist (physician) and Advanced Practice Providers (APPs -  Physician Assistants and Nurse Practitioners) who all work together to provide you with the care you need, when you need it.  Your next appointment:   2 month(s)  The format for your next appointment:   In Person  Provider:   [name], MD

## 2020-01-11 ENCOUNTER — Ambulatory Visit: Payer: 59 | Admitting: General Practice

## 2020-01-12 ENCOUNTER — Encounter: Payer: Self-pay | Admitting: Psychiatry

## 2020-01-12 ENCOUNTER — Ambulatory Visit (INDEPENDENT_AMBULATORY_CARE_PROVIDER_SITE_OTHER): Payer: 59 | Admitting: Psychiatry

## 2020-01-12 ENCOUNTER — Other Ambulatory Visit: Payer: Self-pay

## 2020-01-12 VITALS — BP 128/80 | HR 82

## 2020-01-12 DIAGNOSIS — F411 Generalized anxiety disorder: Secondary | ICD-10-CM

## 2020-01-12 NOTE — Progress Notes (Signed)
Dashanae Lanius Burdi [id number] 03/20/1991 29 y.o.  Subjective:   Patient ID:  Melanie Jordan is a 29 y.o. (DOB 1991/03/02) female.  Chief Complaint:  Chief Complaint  Patient presents with  . Anxiety  . Sleeping Problem    HPI Melanie Jordan presents to the office today for follow-up of anxiety. Prozac was stopped when she had low heart rate. She denies any other side effects with Prozac. Reports that she did not notice any improvement during short-time on Prozac. She reports that her medical provider took her off of metoprolol and wants her to start propranolol and "I don't really want to."   She reports that her anxiety has "been ok, better than it used to be." She reports feeling "spacey and light-headed." She reports that she has noticed some decrease in anxious thoughts. Continues to have some worry and anxious thoughts. She had a panic attack a few weeks ago and reports that panic attack was shorter than duration compared to the past. Occ rumination. She reports that she is unsure if chest pain and palpitations are anxiety related. Reports that she will feel fearful and sometimes a tightness in her chest when she is at work or driving. Reports brief period of depression for a couple of days when she was feeling as if s/s would not improve. She reports that she has been having difficulty staying asleep during the work week and occ difficulty falling asleep. Sleep is better on weekends. Appetite has been good. Reports gaining weight despite trying to be more active. Energy and motivation have been low. Reports that she has not been wanting to get off the couch and "even small things" require a significant push and will procrastinate. She reports that her concentration has been poor and has difficulty focusing on things that she is not interested in. Denies SI.   Past Psychiatric Medication Trials: Nortriptyline- Caused depression and SI. Effexor- "Felt not connected to body." Felt  fearful. Prozac- Felt jittery but less anxious. Viibryd- worsening derealization Buspar- "felt like I was drunk." Wellbutrin- had frequent tearfulness Adderall- Had increased HR Mydayis- Best tolerted Xanax- Effective for panic attacks Metoprolol- was prescribed by neurologist for migraines and was advised   GAD-7     Office Visit from 04/02/2019 in [place]  Total GAD-7 Score  8    PHQ2-9     Office Visit from 04/02/2019 in [facility] Visit from 09/21/2018 in [place]  PHQ-2 Total Score  0  0  PHQ-9 Total Score  4  --       Review of Systems:  Review of Systems  Cardiovascular: Positive for palpitations.  Musculoskeletal: Negative for gait problem.  Neurological: Positive for dizziness and headaches. Negative for tremors.       Frequent migraines  Psychiatric/Behavioral:       Please refer to HPI    Medications: I have reviewed the patient's current medications.  Current Outpatient Medications  Medication Sig Dispense Refill  . Acetaminophen (TYLENOL PO) Take by mouth as needed.    . Cetirizine HCl (ZYRTEC PO) Take by mouth daily as needed.     . Cholecalciferol (VITAMIN D3) 25 MCG (1000 UT) CAPS Take 1 capsule by mouth daily.    . JUNEL FE 1/20 1-20 MG-MCG tablet TAKE 1 TABLET BY MOUTH EVERY DAY 3 Package 3  . Multiple Vitamins-Minerals (MULTIVITAMIN ADULTS PO) Take by mouth daily.    . vitamin B-12 (CYANOCOBALAMIN) 1000 MCG tablet Take  1,000 mcg by mouth daily.    Marland Kitchen ALPRAZolam (XANAX) 0.25 MG tablet Take 1 tablet (0.25 mg total) by mouth daily as needed for anxiety. 15 tablet 0  . FAMOTIDINE PO Take by mouth daily as needed.     . hyoscyamine (LEVSIN SL) 0.125 MG SL tablet Place 1 tablet (0.125 mg total) under the tongue every 4 (four) hours as needed. (Patient not taking: Reported on 01/12/2020) 60 tablet 3  . promethazine (PHENERGAN) 25 MG tablet TAKE 1 TABLET (25 MG TOTAL) BY MOUTH EVERY 8  (EIGHT) HOURS AS NEEDED FOR NAUSEA FOR UP TO 90 DAYS    . propranolol (INDERAL) 40 MG tablet Take 1 tablet (40 mg total) by mouth daily. (Patient not taking: Reported on 01/12/2020) 30 tablet 3   No current facility-administered medications for this visit.    Medication Side Effects: None  Allergies: No Known Allergies  Past Medical History:  Diagnosis Date  . ADHD    now off Adderal   . Allergy   . Anxiety   . Chronic head pain    taking Metoprolol  . GERD (gastroesophageal reflux disease)   . IBS (irritable bowel syndrome)     Family History  Problem Relation Age of Onset  . Cancer Mother 30       cervical and stomach cancer  . Fibroids Mother        uterine fibroid  . Thyroid disease Mother        hypothyroidism due to [place]  . Osteopenia Mother   . GER disease Mother   . Drug abuse Mother   . Stomach cancer Mother   . Anxiety disorder Mother   . Asthma Brother   . Anxiety disorder Brother   . Irritable bowel syndrome Brother   . Arthritis Maternal Grandmother   . Depression Maternal Grandmother   . Heart disease Maternal Grandmother 63       CAD with stents  . Hyperlipidemia Maternal Grandmother   . Hypertension Maternal Grandmother   . Stroke Maternal Grandmother 72       TIA  . Cancer Paternal Grandmother 44       uterine cancer  . Asthma Brother   . Anxiety disorder Sister   . Depression Father   . ADD / ADHD Maternal Uncle   . Colon cancer Neg Hx   . Esophageal cancer Neg Hx   . Rectal cancer Neg Hx     Social History   Socioeconomic History  . Marital status: Single    Spouse name: Not on file  . Number of children: 0  . Years of education: Not on file  . Highest education level: Associate degree: occupational, Hotel manager, or vocational program  Occupational History  . 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 Smoking status: Never Smoker  . Smokeless tobacco: Never Used  Substance and Sexual  Activity  . Alcohol use: Not Currently    Comment: social  . Drug use: Never  . Sexual activity: Yes    Birth control/protection: Pill  Other Topics Concern  . Not on file  Social History Narrative   Engaged.      Started drinking more during the day - up to ~2 L (since onset of dizziness).      NO routine exercise.      Works in [employer] @ [place] Strain:   .  Difficulty of Paying Living Expenses: Not on file  Food Insecurity:   . Worried About Charity fundraiser in the Last Year: Not on file  . Ran Out of Food in the Last Year: Not on file  Transportation Needs:   . Lack of Transportation (Medical): Not on file  . Lack of Transportation (Non-Medical): Not on file  Physical Activity:   . Days of Exercise per Week: Not on file  . Minutes of Exercise per Session: Not on file  Stress:   . Feeling of Stress : Not on file  Social Connections:   . Frequency of Communication with Friends and Family: Not on file  . Frequency of Social Gatherings with Friends and Family: Not on file  . Attends Religious Services: Not on file  . Active Member of Clubs or Organizations: Not on file  . Attends Archivist Meetings: Not on file  . Marital Status: Not on file  Intimate Partner Violence:   . Fear of Current or Ex-Partner: Not on file  . Emotionally Abused: Not on file  . Physically Abused: Not on file  . Sexually Abused: Not on file    Past Medical History, Surgical history, Social history, and Family history were reviewed and updated as appropriate.   Please see review of systems for further details on the patient's review from today.   Objective:   Physical Exam:  BP 128/80   Pulse 82   Physical Exam Constitutional:      General: She is not in acute distress.    Appearance: She is well-developed.  Musculoskeletal:        General: No deformity.  Neurological:     Mental Status: She is alert and oriented  to person, place, and time.     Coordination: Coordination normal.  Psychiatric:        Attention and Perception: Attention and perception normal. She does not perceive auditory or visual hallucinations.        Mood and Affect: Mood is anxious. Mood is not depressed. Affect is not labile, blunt, angry or inappropriate.        Speech: Speech normal.        Behavior: Behavior normal.        Thought Content: Thought content normal. Thought content is not paranoid or delusional. Thought content does not include homicidal or suicidal ideation. Thought content does not include homicidal or suicidal plan.        Cognition and Memory: Cognition and memory normal.        Judgment: Judgment normal.     Comments: Insight intact     Lab Review:     Component Value Date/Time   NA 136 08/03/2019 1130   K 3.9 10/27/2019 1623   CL 102 08/03/2019 1130   CO2 24 08/03/2019 1130   GLUCOSE 98 08/03/2019 1130   BUN 8 08/03/2019 1130   CREATININE 0.85 08/03/2019 1130   CREATININE 0.73 04/12/2019 1450   CALCIUM 9.1 08/03/2019 1130   PROT 7.6 08/03/2019 1130   ALBUMIN 4.5 08/03/2019 1130   AST 15 08/03/2019 1130   ALT 15 08/03/2019 1130   ALKPHOS 36 (L) 08/03/2019 1130   BILITOT 0.6 08/03/2019 1130   GFRNONAA >60 08/03/2019 1130   GFRAA >60 08/03/2019 1130       Component Value Date/Time   WBC 6.9 08/03/2019 1130   RBC 4.59 08/03/2019 1130   HGB 14.0 08/03/2019 1130   HCT 43.1 08/03/2019 1130   PLT 281  08/03/2019 1130   MCV 93.9 08/03/2019 1130   MCH 30.5 08/03/2019 1130   MCHC 32.5 08/03/2019 1130   RDW 12.2 08/03/2019 1130   LYMPHSABS 2.6 08/03/2019 1130   MONOABS 0.4 08/03/2019 1130   EOSABS 0.1 08/03/2019 1130   BASOSABS 0.0 08/03/2019 1130    No results found for: POCLITH, LITHIUM   No results found for: PHENYTOIN, PHENOBARB, VALPROATE, CBMZ   .res Assessment: Plan:   Patient seen for 30 minutes and time spent counseling patient and reviewing notes from neurologist and  cardiologist.  Discussed patient's concerns and reservations regarding propanolol since she reports that she has been advised to stop metoprolol and start propanolol, and has not yet started propanolol.  Discussed that propanolol typically has a shorter duration compared to metoprolol and that she would likely know response to propanolol immediately and if she is able to tolerate it.  Discussed that she may wish to consider taking only 1/2 tablet initially since this would have lower risk of side effects and possibly lower her anxiety about starting new medication, and then she could increase to a full tablet once she felt more comfortable with the medication. Discussed that propanolol is often prescribed for treatment of anxiety signs and symptoms and tends to have a better anxiolytic effect compared to metoprolol, and therefore may be helpful for anxiety in addition to tachycardia, palpitations, and headaches.  Patient agrees to trial of propanolol.  Discussed retrial of Prozac if patient has adverse effects with propanolol and decides not to continue propanolol.  Advised patient to contact office if this occurs. Recommend continuing psychotherapy with Rinaldo Cloud, LCSW. Patient to follow-up with this provider in 4 to 5 weeks or sooner if clinically indicated. Patient advised to contact office with any questions, adverse effects, or acute worsening in signs and symptoms.  Melanie Jordan was seen today for anxiety and sleeping problem.  Diagnoses and all orders for this visit:  Generalized anxiety disorder     Please see After Visit Summary for patient specific instructions.  Future Appointments  Date Time Provider [place]  01/19/2020 10:00 AM Shanon Ace, LCSW [facility] None  02/02/2020 10:00 AM Shanon Ace, LCSW [facility] None  02/09/2020 10:30 AM Thayer Headings, PMHNP [facility] None  02/16/2020 10:00 AM Shanon Ace, LCSW [facility] None  03/01/2020 11:00 AM Shanon Ace, LCSW [facility] None  03/06/2020  3:20  PM Leonie Man, MD [facility]  03/14/2020  8:45 AM Bo Merino, MD [facility] None  04/03/2020 10:30 AM Bo Merino, MD [facility] None    No orders of the defined types were placed in this encounter.   -------------------------------

## 2020-01-19 ENCOUNTER — Other Ambulatory Visit: Payer: Self-pay

## 2020-01-19 ENCOUNTER — Ambulatory Visit (INDEPENDENT_AMBULATORY_CARE_PROVIDER_SITE_OTHER): Payer: 59 | Admitting: Psychiatry

## 2020-01-19 DIAGNOSIS — F411 Generalized anxiety disorder: Secondary | ICD-10-CM

## 2020-01-19 NOTE — Progress Notes (Signed)
[facility] Counselor/Therapist Progress Note  Patient ID: Melanie Jordan, MRN: [id number],    Date: 01/19/2020  Time Spent: 60 minutes 10:00am to 11:00am  Treatment Type: Individual Therapy  Reported Symptoms: anxiety, worry, ("but has lessened some")  Mental Status Exam:  Appearance:   Casual     Behavior:  Appropriate, Sharing and Motivated  Motor:  Normal  Speech/Language:   Normal Rate  Affect:  anxious  Mood:  anxious and some depression  Thought process:  normal  Thought content:    WNL  Sensory/Perceptual disturbances:    WNL  Orientation:  oriented to person, place, time/date, situation, day of week, month of year and year  Attention:  Good  Concentration:  Good  Memory:  WNL  Fund of knowledge:   Good  Insight:    Good  Judgment:   Good  Impulse Control:  Good/Fair   Risk Assessment: Danger to Self:  No Self-injurious Behavior: No Danger to Others: No Duty to Warn:no Physical Aggression / Violence:No  Access to Firearms a concern: No  Gang Involvement:No   Subjective: Patient in today reporting her anxiety has decreased a little and that she has been better about countering her anxious thoughts with more realistic thoughts on some occasions. "Feels weird to have some decrease in anxiety and it's almost like I feel empty."  Discussed this feeling and what it means to her, and normalized the thoughts and feelings she shared.    Interventions: Cognitive Behavioral Therapy and Solution-Oriented/Positive Psychology  Diagnosis:   ICD-10-CM   1. Generalized anxiety disorder  F41.1      Plan of Care: Patient not signing Tx Plan on computer screen due to COVID-19.  Treatment Goals: Goals will remain on tx plan as patient works on strategies to achieve her goals. Progress will be noted each session in "Progress" section of the Plan.  Long term goal: Reduce overall level, frequency, and intensity of the anxiety so that daily functioning is not  impaired.  Short term goal: Verbalize an understanding of the role that fearful thinking plays in creating fears, excessive worry, and persistent anxiety symptoms.  Strategy: Identify, challenge, and replace fearful self-talk with positive, realistic, and empowering self-talk.  Progress: Patient in today reporting her anxiety and fears have lessened some, and now feels more "empty" without the anxiety "as I was so used to being anxious". Motivated to workout physically but not very motivated in other ways. Still bothered by and hard to adapt to changes in schedule and other uncertainties. Not worrying as much about health concerns.  On 1-10 scale of anxiety, she reports that she was an "8" initially when coming here for treatment, and today she rates herself a 4 on anxiety.". Discussed strategies for her to follow through on suggestions re: her anxious thoughts and.  Fears husband may leave her, and the "anxious thoughts make me feel that way ."  Looked at his anxious thoughts and reviewed thought interruption and replacement with more positive and realistic thoughts.  Also to stick with not buying more things right now as we discussed her more recent buying "to treat myself" and she felt she needed to curb her spending due to some other debt right now. Encouraged more emphasis on postive self-talk.  Goal review and progress noted with patient.      Next appt within 2 week   Shanon Ace, LCSW

## 2020-01-27 ENCOUNTER — Ambulatory Visit: Payer: 59 | Admitting: Physical Therapy

## 2020-01-27 DIAGNOSIS — M5481 Occipital neuralgia: Secondary | ICD-10-CM | POA: Diagnosis not present

## 2020-01-27 DIAGNOSIS — G43809 Other migraine, not intractable, without status migrainosus: Secondary | ICD-10-CM | POA: Diagnosis not present

## 2020-01-27 DIAGNOSIS — M542 Cervicalgia: Secondary | ICD-10-CM | POA: Diagnosis not present

## 2020-01-27 DIAGNOSIS — Z79899 Other long term (current) drug therapy: Secondary | ICD-10-CM | POA: Diagnosis not present

## 2020-02-02 ENCOUNTER — Other Ambulatory Visit: Payer: Self-pay

## 2020-02-02 ENCOUNTER — Ambulatory Visit (INDEPENDENT_AMBULATORY_CARE_PROVIDER_SITE_OTHER): Payer: 59 | Admitting: Psychiatry

## 2020-02-02 ENCOUNTER — Ambulatory Visit: Payer: 59 | Attending: Neurology | Admitting: Physical Therapy

## 2020-02-02 DIAGNOSIS — F411 Generalized anxiety disorder: Secondary | ICD-10-CM | POA: Diagnosis not present

## 2020-02-02 DIAGNOSIS — G44221 Chronic tension-type headache, intractable: Secondary | ICD-10-CM | POA: Diagnosis not present

## 2020-02-02 DIAGNOSIS — M542 Cervicalgia: Secondary | ICD-10-CM | POA: Insufficient documentation

## 2020-02-02 DIAGNOSIS — R293 Abnormal posture: Secondary | ICD-10-CM | POA: Insufficient documentation

## 2020-02-02 DIAGNOSIS — R2681 Unsteadiness on feet: Secondary | ICD-10-CM | POA: Insufficient documentation

## 2020-02-02 DIAGNOSIS — R42 Dizziness and giddiness: Secondary | ICD-10-CM | POA: Diagnosis not present

## 2020-02-02 NOTE — Progress Notes (Signed)
[facility] Counselor/Therapist Progress Note  Patient ID: Melanie Jordan, MRN: [id number],    Date: 02/02/2020  Time Spent: 60 minutes 10:00am to 11:00am   Treatment Type: Individual Therapy  Reported Symptoms: anxiety, irritable, easily angered, some depression   Mental Status Exam:  Appearance:   Casual     Behavior:  Appropriate and Sharing  Motor:  Normal  Speech/Language:   Normal Rate  Affect:  anxious  Mood:  anxious and some depression  Thought process:  goal directed  Thought content:    WNL  Sensory/Perceptual disturbances:    WNL  Orientation:  oriented to person, place, time/date, situation, day of week, month of year and year  Attention:  Good/Fair  Concentration:  Good/Fair  Memory:  Good/Fair   Fund of knowledge:   Good  Insight:    Good  Judgment:   Good  Impulse Control:  Good   Risk Assessment: Danger to Self:  No Self-injurious Behavior: No Danger to Others: No Duty to Warn:no Physical Aggression / Violence:No  Access to Firearms a concern: No  Gang Involvement:No   Subjective: Patient today reports "anxiety and feeling it in my chest more".  Also having some depression, irritability, and notices her anger "comes more easily". States this happens only after she has gotten mad for no reason and this seems to be happening more. This happens more when with fiance, versus at work or other places.    Interventions: Cognitive Behavioral Therapy and Solution-Oriented/Positive Psychology  Diagnosis:   ICD-10-CM   1. Generalized anxiety disorder  F41.1     Plan of Care: Patient not signing Tx Plan on computer screen due to COVID-19.  Treatment Goals: Goals will remain on tx plan as patient works on strategies to achieve her goals. Progress will be noted each session in "Progress" section of the Plan.  Long term goal: Reduce overall level, frequency, and intensity of the anxiety so that daily functioning is not impaired.  Short  term goal: Verbalize an understanding of the role that fearful thinking plays in creating fears, excessive worry, and persistent anxiety symptoms.  Strategy: Identify, challenge, and replace fearful self-talk with positive, realistic, and empowering self-talk.  Progress: Patient in today with anxiety, some depression, easily angered, and irritable.  Realized that she has been getting "angry for no real reason with fiance mostly" and has been working to calm that down, and has been having some success with this.  States she has not been as worried about her health lately but more generalized worries, as well as anger with other people.  Some improvement in motivation as I did more household chores that I usually put off.  Feels that she is getting some better in not being quite as upset over schedule changes and other uncertainties. Still rating herself a "4" on  1-10 anxiety scale. Still has some fears that fiance will leave her because of her emotional issues. Also shares that she is working on not over-spending and buying things she doesn't really need, and has shown more self-control to not overly purchase. Making some progress in replacing negative self-talk which is a "work in progress".  "I do best emotionally when not distracted by things around me."  Acknowledges how much more effort she is putting into making changes and that feels good to her.   Goal review and progress noted with patient.  Next appt within 2 weeks.   Shanon Ace, LCSW

## 2020-02-02 NOTE — Therapy (Signed)
[facility] [address] [place], [place], [address] Phone: [phone]   Fax:  [phone]  Physical Therapy Evaluation  Patient Details  Name: Melanie Jordan MRN: [id number] Date of Birth: [birth year] Referring Provider (PT): Ray Church, MD   Encounter Date: 02/02/2020  PT End of Session - 02/02/20 1709    Visit Number  1    Number of Visits  13    Date for PT Re-Evaluation  05/02/20    Authorization Type  [employer] - [employer]    PT Start Time  [phone]    PT Stop Time  0803    PT Time Calculation (min)  46 min    Activity Tolerance  Patient tolerated treatment well    Behavior During Therapy  [facility] for tasks assessed/performed       Past Medical History:  Diagnosis Date  . ADHD    now off Adderal   . Allergy   . Anxiety   . Chronic head pain    taking Metoprolol  . GERD (gastroesophageal reflux disease)   . IBS (irritable bowel syndrome)     Past Surgical History:  Procedure Laterality Date  . ESOPHAGEAL MANOMETRY N/A 10/13/2019   Procedure: ESOPHAGEAL MANOMETRY (EM);  Surgeon: Mauri Pole, MD;  Location: [facility];  Service: Endoscopy;  Laterality: N/A;    There were no vitals filed for this visit.   Subjective Assessment - 02/02/20 0721    Subjective  Symptoms started in May when [facility] started and pt was working in the hospital a lot.  Works at [employer] as Therapist, music.  Compared to last year pt continues to have a swaying sensation - but does go away when she is moving or relaxing.  Pt continues to have headaches with pressure behind L eye - is not currently taking any medication for migraines but plans to start propanolol - unable to tolerate other medications.  Does have intermittent auras (dark area in L eye and sparles in vision).  Dizziness increases when in busy environments, multiple eye movements, bright lights at work and when stressed.    Pertinent  History  migraines, ADHD, generalized anxiety disorder, IBS, B12 deficiency    Diagnostic tests  cervical MRI, LUE nerve conduction study - all WFL    Patient Stated Goals  decrease HA and dizziness    Currently in Pain?  Yes    Pain Score  4     Pain Location  Head    Pain Orientation  Left    Pain Descriptors / Indicators  Headache    Pain Type  Chronic pain    Pain Radiating Towards  Tingling in LUE has improved since taking B12 shots         [facility] PT Assessment - 02/02/20 0732      Assessment   Medical Diagnosis  vestibular migraines, L occipital neuralgia    Referring Provider (PT)  Ray Church, MD    Onset Date/Surgical Date  09/27/19    Prior Therapy  yes at [facility] for vestibular rehab and then transferred to Neuro      Precautions   Precautions  Other (comment)    Precaution Comments   migraines, ADHD, generalized anxiety disorder, IBS, B12 deficiency      Balance Screen   Has the patient fallen in the past 6 months  No   bumped into walls   Has the patient had a decrease in activity level because  of a fear of falling?   Yes    Is the patient reluctant to leave their home because of a fear of falling?   Yes      [place] residence    Living Arrangements  Spouse/significant other    Type of [place]    Additional Comments  pt is back to doing housework.  Gets dizzy in the shower - has stall shower and feels confined.  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; shopping      Observation/Other Assessments   Focus on Therapeutic Outcomes (FOTO)   65% function    Other Surveys   Dizziness Handicap Inventory (DHI)     Dizziness Handicap Inventory (DHI)   56%      Sensation   Light Touch  Appears Intact    Additional Comments  gets numbness in face with migraine      Posture/Postural Control   Posture/Postural Control  Postural limitations    Postural Limitations  Rounded Shoulders;Forward head;Increased thoracic kyphosis      Functional Gait  Assessment   Gait assessed   Yes    Gait Level Surface  Walks 20 ft in less than 7 sec but greater than 5.5 sec, uses assistive device, slower speed, mild gait deviations, or deviates 6-10 in outside of the 12 in walkway width.    Change in Gait Speed  Able to smoothly change walking speed without loss of balance or gait deviation. Deviate no more than 6 in outside of the 12 in walkway width.   increase in HA3   Gait with Horizontal Head Turns  Performs head turns smoothly with slight change in gait velocity (eg, minor disruption to smooth gait path), deviates 6-10 in outside 12 in walkway width, or uses an assistive device.    Gait with Vertical Head Turns  Performs head turns with no change in gait. Deviates no more than 6 in outside 12 in walkway width.    Gait and Pivot Turn  Pivot turns safely within 3 sec and stops quickly with no loss of balance.    Step Over Obstacle  Is able to step over 2 stacked shoe boxes taped together (9 in total height) without changing gait speed. No evidence of imbalance.    Gait with Narrow Base of Support  Is able to ambulate for 10 steps heel to toe with no staggering.    Gait with Eyes Closed  Walks 20 ft, uses assistive device, slower speed, mild gait deviations, deviates 6-10 in outside 12 in walkway width. Ambulates 20 ft in less than 9 sec but greater than 7 sec.    Ambulating Backwards  Walks 20 ft, uses assistive device, slower speed, mild gait deviations, deviates 6-10 in outside 12 in walkway width.    Steps  Alternating feet, no rail.    Total Score  26    FGA comment:  26/30           Vestibular Assessment -  02/02/20 0737      Symptom Behavior   Subjective history of  current problem  sitting still makes dizziness worse - even sitting at stop light.  Better when moving    Type of Dizziness   Comment;"World moves"   swaying sensation   Frequency of Dizziness  intermittent    Duration of Dizziness  constant    Symptom Nature  Intermittent;Spontaneous    Aggravating Factors  Moving eyes;Comment   big box stores, sitting still   Relieving Factors  Comments   moving around   Progression of Symptoms  Better      Oculomotor Exam   Oculomotor Alignment  Abnormal   sits with head rotated to R, tilted R   Ocular ROM  WFL   nausea   Spontaneous  Absent    Gaze-induced   Absent    Smooth Pursuits  Saccades   nausea   Saccades  Slow   nausea   Comment  --      Oculomotor Exam-Fixation Suppressed    Left Head Impulse  negative    Right Head Impulse  negative      Vestibulo-Ocular Reflex   VOR to Slow Head Movement  Normal    VOR Cancellation  Normal      Positional Sensitivities   Nose to Right Knee  Lightheadedness    Right Knee to Sitting  Lightedness    Nose to Left Knee  Lightheadedness    Left Knee to Sitting  Lightheadedness    Head Turning x 5  Mild dizziness    Head Nodding x 5  Mild dizziness   neck pain   Pivot Right in Standing  Lightheadedness    Pivot Left in Standing  Mild dizziness    Positional Sensitivities Comments  starting with slight swaying sensation; slight increase in HA with movements          Objective measurements completed on examination: See above findings.              PT Education - 02/02/20 1708    Education Details  clinical findings, PT effectiveness in treating vestibular migraines and other areas that will be addressed in PT, PT goals and POC    Person(s) Educated  Patient    Methods  Explanation    Comprehension  Verbalized understanding       PT Short Term Goals - 02/02/20 2053      PT SHORT TERM GOAL #1   Title  Pt will  be independent with initial HEP    Time  6    Period  Weeks    Status  New    Target Date  03/18/20      PT SHORT TERM GOAL #2   Title  Pt will participate in assessment of cervical ROM and muscle tension with LTG to be set    Time  6    Period  Weeks    Status  New    Target Date  03/18/20      PT SHORT TERM GOAL #3   Title  Pt will demonstrate decreased falls risk during gait as indicated by 2 point improvement in FGA    Baseline  26/30    Time  6    Period  Weeks    Status  New    Target Date  03/18/20      PT SHORT TERM GOAL #4   Title  Pt will report decrease in symptoms of dizziness when performing head movements in various directions and when performing quick body turns    Time  6    Period  Weeks    Status  New    Target Date  03/18/20        PT Long Term Goals - 02/02/20 2101      PT LONG TERM GOAL #1   Title  Pt will demonstrate independence with final vestibular, balance and aerobic HEP    Time  12    Period  Weeks    Status  New    Target Date  05/02/20      PT LONG TERM GOAL #2   Title  Pt will demonstrate no falls risk during gait as indicated by score of 30/30 on FGA    Time  12    Period  Weeks    Status  New    Target Date  05/02/20      PT LONG TERM GOAL #3   Title  Pt will report 50% reduction in swaying sensation when taking a shower, when at work or when sitting still in her car    Time  12    Period  Weeks    Status  New    Target Date  05/02/20      PT LONG TERM GOAL #4   Title  Pt will demonstrate 10 deg increase in cervical ROM and report </= 3/10 HA and neck pain on average each week    Baseline  ROM TBD    Time  12    Period  Weeks    Status  New    Target Date  05/02/20      PT LONG TERM GOAL #5   Title  Pt will increase FOTO to >/= 79% and DHI will decrease by 18 points overall    Baseline  65%; 56 DHI    Time  12    Period  Weeks    Status  New    Target Date  05/02/20             Plan - 02/02/20 1712     Clinical Impression Statement  Pt is a 29 year old female referred to Neuro OPPT for evaluation of vestibular migraines and occipital neuralgia.  Pt's PMH is significant for the following: migraines, ADHD, generalized anxiety disorder, IBS, B12 deficiency causing LUE numbness and heaviness - now resolved. The following deficits were noted during pt's exam: swaying motion when standing or sitting still similar to mal de debarquement, visual motion sensitivity, impaired oculomotor exam, impaired posture, cervical myofascial pain and headaches, impaired balance and gait.  Pt's FGA score indicates pt is at low risk for falls. Pt would benefit from skilled PT to address these impairments and functional limitations to maximize functional mobility independence and reduce falls risk.    Personal Factors and Comorbidities  Comorbidity 3+;Behavior Pattern;Past/Current Experience;Profession    Comorbidities  migraines, ADHD, generalized anxiety disorder, IBS, B12 deficiency, not currently taking medication to manage migraines - previous medication had too strong side effects    Examination-Activity Limitations  Locomotion Level;Stand;Sit    Examination-Participation Restrictions  Cleaning;Community Activity;Driving;Shop    Stability/Clinical Decision Making  Evolving/Moderate complexity    Clinical Decision Making  Moderate    Rehab Potential  Fair    PT Frequency  1x / week    PT Duration  12 weeks    PT Treatment/Interventions  ADLs/Self Care Home Management;Aquatic Therapy;Canalith Repostioning;Cryotherapy;Moist Heat;Gait training;Stair training;Functional mobility training;Therapeutic activities;Therapeutic exercise;Balance training;Neuromuscular re-education;Patient/family education;Manual techniques;Passive range of motion;Dry needling;Vestibular    PT Next Visit Plan  Assess  neck; initiate HEP - incorporate aerobic exercise; aquatic?  Diet triggers?  Dry needling?    Recommended Other Services  aquatic     Consulted and Agree with Plan of Care  Patient       Patient will benefit from skilled therapeutic intervention in order to improve the following deficits and impairments:  Dizziness, Pain, Postural dysfunction, Decreased range of motion, Decreased balance, Difficulty walking  Visit Diagnosis: Dizziness and giddiness  Cervicalgia  Unsteadiness on feet  Chronic tension-type headache, intractable  Abnormal posture     Problem List Patient Active Problem List   Diagnosis Date Noted  . Dysphagia   . Anterior chest wall pain 08/12/2019  . Dizziness on standing 05/24/2019  . Rapid palpitations 05/24/2019  . Adjustment disorder with anxious mood 04/02/2019  . Initiation of oral contraception 09/21/2018    Melanie Jordan, PT, DPT 02/02/20    9:07 PM    [facility] [address] [place], [place], [address] Phone: [phone]   Fax:  [phone]  Name: Melanie Jordan MRN: [id number] Date of Birth: 25-Oct-1991

## 2020-02-04 ENCOUNTER — Encounter: Payer: Self-pay | Admitting: Family Medicine

## 2020-02-07 ENCOUNTER — Other Ambulatory Visit: Payer: Self-pay

## 2020-02-07 ENCOUNTER — Ambulatory Visit: Payer: 59 | Admitting: Physical Therapy

## 2020-02-07 ENCOUNTER — Encounter: Payer: Self-pay | Admitting: Physical Therapy

## 2020-02-07 DIAGNOSIS — R42 Dizziness and giddiness: Secondary | ICD-10-CM

## 2020-02-07 DIAGNOSIS — M542 Cervicalgia: Secondary | ICD-10-CM

## 2020-02-07 DIAGNOSIS — R293 Abnormal posture: Secondary | ICD-10-CM | POA: Diagnosis not present

## 2020-02-07 DIAGNOSIS — R2681 Unsteadiness on feet: Secondary | ICD-10-CM | POA: Diagnosis not present

## 2020-02-07 DIAGNOSIS — G44221 Chronic tension-type headache, intractable: Secondary | ICD-10-CM

## 2020-02-07 NOTE — Patient Instructions (Addendum)
Aquatic Therapy: What to Expect!  Where:  [facility]   NOTE: You will receive an automated phone message [place]   reminding you of your appointment and it will say the  [place], [place]  [address]    appointment is at the [facility] on [address]. We are  [phone]     working to fix this - just know that you will meet [place] at        pool! How to Prepare: . Please make sure you drink 8 ounces of water about one hour prior to your pool session . A caregiver must attend the entire session with the patient.  The caregiver will be responsible for assisting with dressing as well as any toileting needs.  . Please arrive IN YOUR SUIT and a few minutes prior to your appointment - a health screen will be completed as you enter the [facility].  . Please make sure to attend to any toileting needs prior to entering the pool . Once on the pool deck your therapist will ask you to sign the Patient  Consent and Assignment of Benefits form . Your therapist may take your blood pressure prior to, during and after your session if indicated  About the pool: 1. Entering the pool Your therapist will assist you; there are multiple ways to enter including stairs with railings, a walk in ramp, a roll in chair and a mechanical lift. Your therapist will determine the most appropriate way for you. 2. Water temperature is usually between 86-87 degrees 3. There may be other swimmers in the pool at the same time   Contact Info:     Appointments: [facility]  All sessions are 45 minutes   [place] [address]    Please call the [facility] if   [place], [place]  [address]    you need to cancel or reschedule an appointment.  [phone]          Aquatic Therapy:  Keeping Everyone Safe!!!  We are so excited to be back in the pool for therapy and can't wait to see you in the water!! Having said that, we also want to make sure that we  keep you, your family member, and everyone one else safe.  We have been in touch with our national aquatic association as well as the CDC to develop safe guidelines for aquatic therapy. First, we want to assure you that the water is one of the safest places to be right now - chlorine and bromine kill the virus and the CDC states the virus cannot be transmitted in a pool; that's great news for [place]! Below are specific guidelines from the CDC that we will ask you and your caregiver to follow when coming to the [facility] for therapy. 1. Please shower AT HOME and COME IN YOUR SUIT and cover up to the pool. 2. Please ensure that you are ON TIME and ready (meaning that you are in your suit and ready to enter the pool) for your appointment - all of our pool appointments are full and there is a waiting list. If you are late, we cannot extend your time in the pool.   3. Locker rooms are open but limited to 4 people at a time. At the end of your session, you can either change into dry clothes in the locker room or plan to leave in your suit Raynald Blend  up and change at home. If you require assistance with this, your caregiver will need to provide that assistance.  4. Follow the Aquatic Center's guidelines to use bathroom/locker room facilities. Signs are posted to provide guidelines for you.  5. The [facility] staff will complete a health screen for you and caregiver as you enter the building.  Once this is completed, PLEASE PROCEED DIRECTLY TO THE POOL DECK. We will be waiting for you there! 6. Masks:  your caregiver must wear a mask at all times. The CDC recommends that patients and therapists wear their masks until we enter the water and then put them back on as we leave the water. PLEASE BRING A PLASTIC BAG TO STORE YOUR MASK IN WHILE WE ARE IN THE POOL.   Additional safety measures: 1. The [facility] will be practicing social distancing, wearing masks and implementing a stringent cleaning  program.  2. We will only be using hard surface equipment and we will be cleaning it between all patients. 3. We will be cleaning hand rails used to enter the pool and chairs that your caregiver might use. 4. We as employees of [employer] must complete a screening every day we work prior to our shift. 5. We are only offering aquatic therapy to patients who have been determined to be at low risk for the virus - we are happy to share that screen with you if you are interested.  We are excited to be able to offer aquatic therapy again in addition to services at our outpatient clinic - thank you for the opportunity to serve you!  Vinnie Level [id number], PT    Forde Radon, OTR/L    04/14/19   Access Code: [email or web address] URL: [email or web address] Date: 02/08/2020 Prepared by: Misty Stanley  Exercises Seated Cervical Sidebending Stretch - 1 x daily - 7 x weekly - 2 sets - 30 second hold Seated Assisted Cervical Rotation with Towel - 1 x daily - 7 x weekly - 2 sets - 5 reps Doorway Pec Stretch at 90 Degrees Abduction - 1 x daily - 7 x weekly - 2 sets - 30 seconds hold Supine Cervical Retraction with Towel - 1 x daily - 7 x weekly - 2 sets - 10 reps Supine Scapular Retraction - 1 x daily - 7 x weekly - 2 sets - 10 reps Seated Gaze Stabilization with Head Rotation - 1 x daily - 7 x weekly - 2 sets - 15 seconds hold Seated Gaze Stabilization with Head Nod - 1 x daily - 7 x weekly - 2 sets - 15 second hold

## 2020-02-08 ENCOUNTER — Telehealth: Payer: Self-pay

## 2020-02-08 NOTE — Telephone Encounter (Signed)
Spoke to patient regarding seeing particles in her stool. She was informed of Dr Ciriglanio's thoughts and recommendations that this was most likely food particles  and agreed that she will monitor her stool for now and contact the office with any questions or concerns.

## 2020-02-08 NOTE — Therapy (Signed)
[facility] [address] [place], [place], [address] Phone: [phone]   Fax:  [phone]  Physical Therapy Treatment  Patient Details  Name: Melanie Jordan MRN: [id number] Date of Birth: 1991/04/20 Referring Provider (PT): Ray Church, MD   Encounter Date: 02/07/2020  PT End of Session - 02/08/20 1633    Visit Number  2    Number of Visits  13    Date for PT Re-Evaluation  05/02/20    Authorization Type  [employer] - UMR    PT Start Time  1330    PT Stop Time  1415    PT Time Calculation (min)  45 min    Activity Tolerance  Patient tolerated treatment well    Behavior During Therapy  [facility] for tasks assessed/performed       Past Medical History:  Diagnosis Date  . ADHD    now off Adderal   . Allergy   . Anxiety   . Chronic head pain    taking Metoprolol  . GERD (gastroesophageal reflux disease)   . IBS (irritable bowel syndrome)     Past Surgical History:  Procedure Laterality Date  . ESOPHAGEAL MANOMETRY N/A 10/13/2019   Procedure: ESOPHAGEAL MANOMETRY (EM);  Surgeon: Mauri Pole, MD;  Location: [facility];  Service: Endoscopy;  Laterality: N/A;    There were no vitals filed for this visit.  Subjective Assessment - 02/07/20 1336    Subjective  No increase in headache or dizziness after evaluation.  Work was busy this weekend so her HA was worse, had to leave work early.  Is going to start beta blocker next Monday.    Pertinent History  migraines, ADHD, generalized anxiety disorder, IBS, B12 deficiency    Diagnostic tests  cervical MRI, LUE nerve conduction study - all WFL    Patient Stated Goals  decrease HA and dizziness    Currently in Pain?  Yes         [facility] PT Assessment - 02/07/20 1336      ROM / Strength   AROM / PROM / Strength  AROM      AROM   Overall AROM   Deficits    Overall AROM Comments  resting head position is in R side flexion and R  rotation    AROM Assessment Site  Cervical    Cervical Flexion  75    Cervical Extension  60    Cervical - Right Side Bend  30    Cervical - Left Side Bend  30    Cervical - Right Rotation  60    Cervical - Left Rotation  60   pain with L rotation                  [facility] Adult PT Treatment/Exercise - 02/08/20 1629      Manual Therapy   Manual Therapy  Myofascial release;Manual Traction    Myofascial Release  to suboccipital muscles prior to performing cervical retraction    Manual Traction  in combination with suboccipital release prior to perfoming cervical retraction        Access Code: [email or web address] URL: [email or web address] Date: 02/08/2020 Prepared by: Misty Stanley  Exercises Seated Cervical Sidebending Stretch - 1 x daily - 7 x weekly - 2 sets - 30 second hold Seated Assisted Cervical Rotation with Towel - 1 x daily - 7 x weekly - 2 sets - 5 reps Doorway Pec Stretch at 90 Degrees Abduction -  1 x daily - 7 x weekly - 2 sets - 30 seconds hold Supine Cervical Retraction with Towel - 1 x daily - 7 x weekly - 2 sets - 10 reps Supine Scapular Retraction - 1 x daily - 7 x weekly - 2 sets - 10 reps Seated Gaze Stabilization with Head Rotation - 1 x daily - 7 x weekly - 2 sets - 15 seconds hold Seated Gaze Stabilization with Head Nod - 1 x daily - 7 x weekly - 2 sets - 15 second hold       PT Education - 02/08/20 1633    Education Details  cervical ROM findings, initiated HEP.  Information sheet for aquatic therapy.  Scheduling of aquatic therapy.    Person(s) Educated  Patient    Methods  Explanation;Demonstration;Handout    Comprehension  Verbalized understanding;Returned demonstration       PT Short Term Goals - 02/02/20 2053      PT SHORT TERM GOAL #1   Title  Pt will be independent with initial HEP    Time  6    Period  Weeks    Status  New    Target Date  03/18/20      PT SHORT TERM GOAL #2   Title  Pt will participate in assessment of  cervical ROM and muscle tension with LTG to be set    Time  6    Period  Weeks    Status  New    Target Date  03/18/20      PT SHORT TERM GOAL #3   Title  Pt will demonstrate decreased falls risk during gait as indicated by 2 point improvement in FGA    Baseline  26/30    Time  6    Period  Weeks    Status  New    Target Date  03/18/20      PT SHORT TERM GOAL #4   Title  Pt will report decrease in symptoms of dizziness when performing head movements in various directions and when performing quick body turns    Time  6    Period  Weeks    Status  New    Target Date  03/18/20        PT Long Term Goals - 02/08/20 1705      PT LONG TERM GOAL #1   Title  Pt will demonstrate independence with final vestibular, balance and aerobic HEP  (LTG due by 05/02/2020)    Time  12    Period  Weeks    Status  New      PT LONG TERM GOAL #2   Title  Pt will demonstrate no falls risk during gait as indicated by score of 30/30 on FGA    Time  12    Period  Weeks    Status  New      PT LONG TERM GOAL #3   Title  Pt will report 50% reduction in swaying sensation when taking a shower, when at work or when sitting still in her car    Time  12    Period  Weeks    Status  New      PT LONG TERM GOAL #4   Title  Pt will demonstrate 10 deg increase in cervical rotation and side bending and report </= 3/10 HA and neck pain on average each week    Baseline  sidebending: 30 deg bilaterally, rotation 60 deg bilaterally  - pain  with L sidebending and L rotation    Time  12    Period  Weeks    Status  Revised      PT LONG TERM GOAL #5   Title  Pt will increase FOTO to >/= 79% and DHI will decrease by 18 points overall    Baseline  65%; 56 DHI    Time  12    Period  Weeks    Status  New            Plan - 02/08/20 1656    Clinical Impression Statement  Continued assessment of patient's cervical spine ROM with pt demonstrating symmetrical movement but increased pain with L rotation and L  sidebending.  Performed manual therapy to suboccipital space and initiated new HEP focusing on cervical spine ROM, isometric strengthening for posture and vestibular habituation.  Pt tolerated well with no increase in HA.  Will continue to progress and will begin to incorporate aquatic therapy every other session.    Personal Factors and Comorbidities  Comorbidity 3+;Behavior Pattern;Past/Current Experience;Profession    Comorbidities  migraines, ADHD, generalized anxiety disorder, IBS, B12 deficiency, not currently taking medication to manage migraines - previous medication had too strong side effects    Examination-Activity Limitations  Locomotion Level;Stand;Sit    Examination-Participation Restrictions  Cleaning;Community Activity;Driving;Shop    Stability/Clinical Decision Making  Evolving/Moderate complexity    Rehab Potential  Fair    PT Frequency  1x / week    PT Duration  12 weeks    PT Treatment/Interventions  ADLs/Self Care Home Management;Aquatic Therapy;Canalith Repostioning;Cryotherapy;Moist Heat;Gait training;Stair training;Functional mobility training;Therapeutic activities;Therapeutic exercise;Balance training;Neuromuscular re-education;Patient/family education;Manual techniques;Passive range of motion;Dry needling;Vestibular    PT Next Visit Plan  Aquatic therapy for stress relief, posture and HEP.  Progress land HEP, vestibular habituation, thoracic spine mobilization, posture, incorporate aerobic exercise;  Diet triggers?  Dry needling?    Consulted and Agree with Plan of Care  Patient       Patient will benefit from skilled therapeutic intervention in order to improve the following deficits and impairments:  Dizziness, Pain, Postural dysfunction, Decreased range of motion, Decreased balance, Difficulty walking  Visit Diagnosis: Dizziness and giddiness  Cervicalgia  Unsteadiness on feet  Chronic tension-type headache, intractable  Abnormal posture     Problem  List Patient Active Problem List   Diagnosis Date Noted  . Dysphagia   . Anterior chest wall pain 08/12/2019  . Dizziness on standing 05/24/2019  . Rapid palpitations 05/24/2019  . Adjustment disorder with anxious mood 04/02/2019  . Initiation of oral contraception 09/21/2018    Rico Junker, PT, DPT 02/08/20    5:08 PM    [facility] [address] [place], [place], [address] Phone: [phone]   Fax:  [phone]  Name: MARVIS GATON MRN: [id number] Date of Birth: July 31, 1991

## 2020-02-09 ENCOUNTER — Ambulatory Visit: Payer: 59 | Admitting: Psychiatry

## 2020-02-16 ENCOUNTER — Ambulatory Visit (INDEPENDENT_AMBULATORY_CARE_PROVIDER_SITE_OTHER): Payer: 59 | Admitting: Psychiatry

## 2020-02-16 ENCOUNTER — Ambulatory Visit: Payer: 59 | Admitting: Physical Therapy

## 2020-02-16 ENCOUNTER — Other Ambulatory Visit: Payer: Self-pay

## 2020-02-16 DIAGNOSIS — F411 Generalized anxiety disorder: Secondary | ICD-10-CM | POA: Diagnosis not present

## 2020-02-16 NOTE — Progress Notes (Signed)
[facility] Counselor/Therapist Progress Note  Patient ID: Melanie Jordan, MRN: [id number],    Date: 02/16/2020  Time Spent: 60 minutes  10:00am to 11:00am  Treatment Type: Individual Therapy  Reported Symptoms:  Anxiety, depression, occasional hopelessness  Mental Status Exam:  Appearance:   Casual     Behavior:  Appropriate and Sharing  Motor:  Normal  Speech/Language:   Normal Rate  Affect:  anxious  Mood:  anxious  Thought process:  normal  Thought content:    WNL  Sensory/Perceptual disturbances:    WNL  Orientation:  oriented to person, place, time/date, situation, day of week, month of year and year  Attention:  Fair  Concentration:  Fair  Memory:  some prior forgetfullness but it's better  Fund of knowledge:   Good  Insight:    Good  Judgment:   Good  Impulse Control:  Good   Risk Assessment: Danger to Self:  No Self-injurious Behavior: No Danger to Others: No Duty to Warn:no Physical Aggression / Violence:No  Access to Firearms a concern: No  Gang Involvement:No   Subjective: Patient in today reports anxiety, depression, and a "return of my vertigo which eventually stopped and has stayed stopped except for a couple more brief times", but seems to have subsided for now.Feels she is not worrying over it as much even though "I now it can happen again."    Interventions: Cognitive Behavioral Therapy and Ego-Supportive  Diagnosis:   ICD-10-CM   1. Generalized anxiety disorder  F41.1     Plan of Care: Patient not signing Tx Plan on computer screen due to COVID-19.  Treatment Goals: Goals will remain on tx plan as patient works on strategies to achieve her goals. Progress will be noted each session in "Progress" section of the Plan.  Long term goal: Reduce overall level, frequency, and intensity of the anxiety so that daily functioning is not impaired.  Short term goal: Verbalize an understanding of the role that fearful thinking plays in  creating fears, excessive worry, and persistent anxiety symptoms.  Strategy: Identify, challenge, and replace fearful self-talk with positive, realistic, and empowering self-talk.  Progress: Patient today reports anxiety and depression, with 1 episode of vertigo since last appt, with a couple smaller episodes the next day, and no more episodes after that except much less severe "in the moment and passes quickly".  Still being seen by physical therapist.  "My coworker that makes me angry a lot has pushed back her retirement from June til December.  Experessing Lots of Anxious thoughts about her relationship and admits that she's still fearful that fiance will break up with her because of her emotional issues, so now she is having thoughts of "maybe I should break up with him although she does not want to do that. Processed her feelings about this and does not want to act on her thoughts. Worked more today on the changing of her anxious thoughts to healthier thoughts that do not support anxiety.  Also worked on her Coping List and added some strategies as we spoke about today. These strategies will broaden her ability to confront/change anxious thoughts and not be controlled by them. Rates herself a "4-5" on anxiety 1-10 scale. Seem more encouraged upon leaving session.     Goal review and progress noted with patient.  Next appt within 2 weeks.   Shanon Ace, LCSW

## 2020-02-17 ENCOUNTER — Other Ambulatory Visit: Payer: Self-pay

## 2020-02-17 ENCOUNTER — Ambulatory Visit: Payer: 59 | Admitting: Physical Therapy

## 2020-02-17 ENCOUNTER — Encounter: Payer: Self-pay | Admitting: Physical Therapy

## 2020-02-17 DIAGNOSIS — R2681 Unsteadiness on feet: Secondary | ICD-10-CM

## 2020-02-17 DIAGNOSIS — R42 Dizziness and giddiness: Secondary | ICD-10-CM

## 2020-02-17 DIAGNOSIS — R293 Abnormal posture: Secondary | ICD-10-CM

## 2020-02-17 DIAGNOSIS — G44221 Chronic tension-type headache, intractable: Secondary | ICD-10-CM

## 2020-02-17 DIAGNOSIS — M542 Cervicalgia: Secondary | ICD-10-CM | POA: Diagnosis not present

## 2020-02-17 NOTE — Patient Instructions (Addendum)
Access Code: [email or web address] URL: [email or web address] Date: 02/17/2020 Prepared by: Misty Stanley  Exercises  Try to perform stretches at least once a day: Seated Cervical Sidebending Stretch - 1 x daily - 7 x weekly - 2 sets - 30 second hold Doorway Pec Stretch at 90 Degrees Abduction - 1 x daily - 7 x weekly - 2 sets - 30 seconds hold Supine Cervical Retraction with Towel - 1 x daily - 7 x weekly - 2 sets - 10 reps  3 days a week:  Lying on your back - shoulder diagonal pulls - 1 x daily - 5 x weekly - 2 sets - 10 reps Lying down - kettle bell overhead reach - 1 x daily - 5 x weekly - 1 sets - 10 reps  2 days a week:  Seated Gaze Stabilization with Head Rotation - 1 x daily - 7 x weekly - 2 sets - 15 seconds hold Seated Gaze Stabilization with Head Nod - 1 x daily - 7 x weekly - 2 sets - 15 second hold

## 2020-02-17 NOTE — Therapy (Signed)
[facility] [address] [place], [place], [address] Phone: [phone]   Fax:  [phone]  Physical Therapy Treatment  Patient Details  Name: Melanie Jordan MRN: [id number] Date of Birth: 1991-10-13 Referring Provider (PT): Ray Church, MD   Encounter Date: 02/17/2020  PT End of Session - 02/17/20 1211    Visit Number  3    Number of Visits  13    Date for PT Re-Evaluation  05/02/20    Authorization Type  [employer] - UMR    PT Start Time  1015    PT Stop Time  1100    PT Time Calculation (min)  45 min    Activity Tolerance  Patient tolerated treatment well    Behavior During Therapy  [facility] for tasks assessed/performed       Past Medical History:  Diagnosis Date  . ADHD    now off Adderal   . Allergy   . Anxiety   . Chronic head pain    taking Metoprolol  . GERD (gastroesophageal reflux disease)   . IBS (irritable bowel syndrome)     Past Surgical History:  Procedure Laterality Date  . ESOPHAGEAL MANOMETRY N/A 10/13/2019   Procedure: ESOPHAGEAL MANOMETRY (EM);  Surgeon: Mauri Pole, MD;  Location: [facility];  Service: Endoscopy;  Laterality: N/A;    There were no vitals filed for this visit.  Subjective Assessment - 02/17/20 1019    Subjective  Pt reports starting propanolol and just switched to taking it at night.  Has a headache behind L eye today.  Has been more dizzy this week, unsure if it is due to exercises or medicine.  Has a posterior headache and pain in neck after exercises.    Pertinent History  migraines, ADHD, generalized anxiety disorder, IBS, B12 deficiency    Diagnostic tests  cervical MRI, LUE nerve conduction study - all WFL    Patient Stated Goals  decrease HA and dizziness    Currently in Pain?  Yes    Pain Score  5     Pain Location  Head    Pain Orientation  Left    Pain Descriptors / Indicators  Dull    Pain Type  Chronic pain                        [facility] Adult PT Treatment/Exercise - 02/17/20 1202      Therapeutic Activites    Therapeutic Activities  Other Therapeutic Activities    Other Therapeutic Activities  Discussed how pt is performing HEP at home.  Pt reports performing most days, except for when she works 12 hours, and performing them all at once.  Once exercises reviewed and revisions made to HEP discussed how to break up HEP to improve tolerance and to decrease over stimulation and triggering of symptoms.        Exercises   Exercises  Shoulder      Shoulder Exercises: Supine   Horizontal ABduction  Strengthening;Both;10 reps;Theraband    Theraband Level (Shoulder Horizontal ABduction)  Level 3 (Green)    Horizontal ABduction Limitations  only felt stretch in anterior chest    Flexion  Strengthening;Both;10 reps;Weights    Shoulder Flexion Weight (lbs)  5lb kettle bell performing eccentric latissimus activation    Diagonals  Strengthening;Right;Left;10 reps;Theraband    Theraband Level (Shoulder Diagonals)  Level 3 (Green)    Diagonals Limitations  but provided pt with blue  theraband for home      Vestibular Treatment/Exercise - 02/17/20 1207      Vestibular Treatment/Exercise   Vestibular Treatment Provided  Gaze    Gaze Exercises  X1 Viewing Horizontal;X1 Viewing Vertical      X1 Viewing Horizontal   Foot Position  seated unsupported > supported sitting    Reps  2    Comments  15 seconds, mild nausea that resolves quickly; pressure in anterior neck/jaw - attempted to use breathing and [place] tongue resting against roof of mouth to relieve neck tension while performing but pt reported increased neck pain.  Changed to seated in chair for support and grounding - pt reported improvement in neck symptoms      X1 Viewing Vertical   Foot Position  supported sitting    Reps  2    Comments  15 seconds        Access Code: [email or web address] URL: [email or web address] Date:  02/17/2020 Prepared by: Misty Stanley  Exercises  Try to perform stretches at least once a day: Seated Cervical Sidebending Stretch - 1 x daily - 7 x weekly - 2 sets - 30 second hold Doorway Pec Stretch at 90 Degrees Abduction - 1 x daily - 7 x weekly - 2 sets - 30 seconds hold Supine Cervical Retraction with Towel - 1 x daily - 7 x weekly - 2 sets - 10 reps  3 days a week:  Lying on your back - shoulder diagonal pulls - 1 x daily - 5 x weekly - 2 sets - 10 reps Lying down - kettle bell overhead reach - 1 x daily - 5 x weekly - 1 sets - 10 reps  2 days a week:  Seated Gaze Stabilization with Head Rotation - 1 x daily - 7 x weekly - 2 sets - 15 seconds hold Seated Gaze Stabilization with Head Nod - 1 x daily - 7 x weekly - 2 sets - 15 second hold    PT Education - 02/17/20 1211    Education Details  adjusted HEP and broke it up into different days to improve tolerance    Person(s) Educated  Patient    Methods  Explanation;Demonstration;Handout    Comprehension  Verbalized understanding;Returned demonstration       PT Short Term Goals - 02/02/20 2053      PT SHORT TERM GOAL #1   Title  Pt will be independent with initial HEP    Time  6    Period  Weeks    Status  New    Target Date  03/18/20      PT SHORT TERM GOAL #2   Title  Pt will participate in assessment of cervical ROM and muscle tension with LTG to be set    Time  6    Period  Weeks    Status  New    Target Date  03/18/20      PT SHORT TERM GOAL #3   Title  Pt will demonstrate decreased falls risk during gait as indicated by 2 point improvement in FGA    Baseline  26/30    Time  6    Period  Weeks    Status  New    Target Date  03/18/20      PT SHORT TERM GOAL #4   Title  Pt will report decrease in symptoms of dizziness when performing head movements in various directions and when performing quick body turns    Time  6  Period  Weeks    Status  New    Target Date  03/18/20        PT Long Term Goals  - 02/08/20 1705      PT LONG TERM GOAL #1   Title  Pt will demonstrate independence with final vestibular, balance and aerobic HEP  (LTG due by 05/02/2020)    Time  12    Period  Weeks    Status  New      PT LONG TERM GOAL #2   Title  Pt will demonstrate no falls risk during gait as indicated by score of 30/30 on FGA    Time  12    Period  Weeks    Status  New      PT LONG TERM GOAL #3   Title  Pt will report 50% reduction in swaying sensation when taking a shower, when at work or when sitting still in her car    Time  12    Period  Weeks    Status  New      PT LONG TERM GOAL #4   Title  Pt will demonstrate 10 deg increase in cervical rotation and side bending and report </= 3/10 HA and neck pain on average each week    Baseline  sidebending: 30 deg bilaterally, rotation 60 deg bilaterally  - pain with L sidebending and L rotation    Time  12    Period  Weeks    Status  Revised      PT LONG TERM GOAL #5   Title  Pt will increase FOTO to >/= 79% and DHI will decrease by 18 points overall    Baseline  65%; 56 [place]    Time  12    Period  Weeks    Status  New            Plan - 02/17/20 1213    Clinical Impression Statement  Pt demonstrating poor tolerance of exercises - reports increase in dizziness and HA this past week but isn't able to determine if it is related to exercises or new medicine.  Due to pt performing exercises all at once focused session on review and revision of exercises and splitting them up on different days to improve tolerance and avoid symptom triggeres.  Pt has most difficulty with x1 viewing and requires increased support in sitting to perform.  Will continue to gradually progress towards LTG and will begin to add aquatic on 4/14.    Personal Factors and Comorbidities  Comorbidity 3+;Behavior Pattern;Past/Current Experience;Profession    Comorbidities  migraines, ADHD, generalized anxiety disorder, IBS, B12 deficiency, not currently taking medication to  manage migraines - previous medication had too strong side effects    Examination-Activity Limitations  Locomotion Level;Stand;Sit    Examination-Participation Restrictions  Cleaning;Community Activity;Driving;Shop    Stability/Clinical Decision Making  Evolving/Moderate complexity    Rehab Potential  Fair    PT Frequency  1x / week    PT Duration  12 weeks    PT Treatment/Interventions  ADLs/Self Care Home Management;Aquatic Therapy;Canalith Repostioning;Cryotherapy;Moist Heat;Gait training;Stair training;Functional mobility training;Therapeutic activities;Therapeutic exercise;Balance training;Neuromuscular re-education;Patient/family education;Manual techniques;Passive range of motion;Dry needling;Vestibular    PT Next Visit Plan  Aquatic therapy for stress relief, posture and HEP.  Progress land HEP as pt is able to tolerate - x1 viewing in supported sitting, vestibular habituation, thoracic spine mobilization, posture, incorporate aerobic exercise;  Diet triggers?  Dry needling?    Consulted and Agree with  Plan of Care  Patient       Patient will benefit from skilled therapeutic intervention in order to improve the following deficits and impairments:  Dizziness, Pain, Postural dysfunction, Decreased range of motion, Decreased balance, Difficulty walking  Visit Diagnosis: Dizziness and giddiness  Cervicalgia  Unsteadiness on feet  Chronic tension-type headache, intractable  Abnormal posture     Problem List Patient Active Problem List   Diagnosis Date Noted  . Dysphagia   . Anterior chest wall pain 08/12/2019  . Dizziness on standing 05/24/2019  . Rapid palpitations 05/24/2019  . Adjustment disorder with anxious mood 04/02/2019  . Initiation of oral contraception 09/21/2018    Rico Junker, PT, DPT 02/17/20    12:22 PM    [facility] [address] [place], [place], [address] Phone: [phone]   Fax:   [phone]  Name: GREGG FARFAN MRN: [id number] Date of Birth: 1991-10-23

## 2020-02-24 ENCOUNTER — Encounter: Payer: Self-pay | Admitting: Physical Therapy

## 2020-02-24 ENCOUNTER — Other Ambulatory Visit: Payer: Self-pay

## 2020-02-24 ENCOUNTER — Ambulatory Visit: Payer: 59 | Attending: Neurology | Admitting: Physical Therapy

## 2020-02-24 DIAGNOSIS — R2681 Unsteadiness on feet: Secondary | ICD-10-CM | POA: Diagnosis not present

## 2020-02-24 DIAGNOSIS — R42 Dizziness and giddiness: Secondary | ICD-10-CM | POA: Insufficient documentation

## 2020-02-24 DIAGNOSIS — R293 Abnormal posture: Secondary | ICD-10-CM | POA: Insufficient documentation

## 2020-02-24 DIAGNOSIS — M542 Cervicalgia: Secondary | ICD-10-CM | POA: Insufficient documentation

## 2020-02-24 DIAGNOSIS — G44221 Chronic tension-type headache, intractable: Secondary | ICD-10-CM | POA: Insufficient documentation

## 2020-02-24 NOTE — Patient Instructions (Signed)
Access Code: [email or web address] URL: [email or web address] Date: 02/17/2020 Prepared by: Misty Stanley  Exercises  Try to perform stretches at least once a day: Seated Cervical Sidebending Stretch - 1 x daily - 7 x weekly - 2 sets - 30 second hold Doorway Pec Stretch at 90 Degrees Abduction - 1 x daily - 7 x weekly - 2 sets - 30 seconds hold Supine Cervical Retraction with Towel - 1 x daily - 7 x weekly - 2 sets - 10 reps  3 days a week:  Lying on your back - shoulder diagonal pulls - 1 x daily - 5 x weekly - 2 sets - 10 reps Lying down - kettle bell overhead reach - 1 x daily - 5 x weekly - 1 sets - 10 reps  2 days a week:  Seated Gaze Stabilization with Head Rotation - 1 x daily - 7 x weekly - 2 sets - 15 seconds hold Seated Gaze Stabilization with Head Nod - 1 x daily - 7 x weekly - 2 sets - 15 second hold

## 2020-02-26 NOTE — Therapy (Signed)
[facility] [address] [place], [place], [address] Phone: [phone]   Fax:  [phone]  Physical Therapy Treatment  Patient Details  Name: Melanie Jordan MRN: [id number] Date of Birth: 1991/02/22 Referring Provider (PT): Ray Church, MD   Encounter Date: 02/24/2020  PT End of Session - 02/26/20 1834    Visit Number  4    Number of Visits  13    Date for PT Re-Evaluation  05/02/20    Authorization Type  [employer] Employee - [employer]    PT Start Time  [id number]    PT Stop Time  1446    PT Time Calculation (min)  42 min    Activity Tolerance  No increased pain    Behavior During Therapy  Anxious       Past Medical History:  Diagnosis Date  . ADHD    now off Adderal   . Allergy   . Anxiety   . Chronic head pain    taking Metoprolol  . GERD (gastroesophageal reflux disease)   . IBS (irritable bowel syndrome)     Past Surgical History:  Procedure Laterality Date  . ESOPHAGEAL MANOMETRY N/A 10/13/2019   Procedure: ESOPHAGEAL MANOMETRY (EM);  Surgeon: Mauri Pole, MD;  Location: [facility];  Service: Endoscopy;  Laterality: N/A;    There were no vitals filed for this visit.      02/24/20 1407  Symptoms/Limitations  Subjective Has been trying to wean down off of caffeine but hasn't seen much difference yet.  Has stopped taking propanolol - is taking as needed for palpitations.    Has not seen much difference with breaking the exercises up between days; doesn't get an immediate headache but still has a HA every day.  Pertinent History migraines, ADHD, generalized anxiety disorder, IBS, B12 deficiency  Diagnostic tests cervical MRI, LUE nerve conduction study - all WFL  Patient Stated Goals decrease HA and dizziness  Pain Assessment  Pain Score 6  Pain Location Head  Pain Descriptors / Indicators Headache  Pain Type Neuropathic pain       [facility] Adult PT Treatment/Exercise - 02/26/20  1836      Therapeutic Activites    Therapeutic Activities  Other Therapeutic Activities    Other Therapeutic Activities  No change in symptoms since splitting up HEP between days.  Pt also attempting to minimize caffeine to minimize headaches.  Provided pt with handout reviewing vestibular migraines and common triggers.  Reviewed each trigger to see if this could explain patient's sudden increase in symptoms; pt does not report significant sleep concerns other than it takes her a while to fall asleep, only change in medication is stopping the propanolol, pt has tried the Migraine diet but reports it is very hard to maintain since she does not do a lot of cooking - she does think yogurt and milks are a serious trigger.  Discussed with patient the possibility that therapy exercises may be triggering most recent increase in dizziness and headaches due to HA not being well controlled medically (pt has not started injections yet).  Discussed having pt stop therapy and exercises for two weeks to see if symptoms will settle.  Pt became teaful and expressed frustration over symptoms and that she just wants to be better and nothing is helping.  Reassured pt that therapy's goal is to improve her overall function and work to reduce her symptoms but that it has limitations while headaches are not sufficiently managed  because the exercises can often trigger headaches and dizziness.  Pt would like to still try dry needling and aquatic.  Plan to keep Monday's appointment to trial dry needling for headaches and aquatic the following week.  If pt does not respond positively to these treatment interventions, will likely hold PT until pt can be approved for injection therapy.      Exercises   Exercises  Knee/Hip      Knee/Hip Exercises: Aerobic   Tread Mill  x 8 minutes at moderate pace (~2.0 mph) without use of UE for support for aerobic conditioning; pt reports slight decrease in HA after treadmill and no dizziness from  use of treadmill      Shoulder Exercises: Stretch   Wall Stretch - Flexion  2 reps;30 seconds    Wall Stretch - Flexion Limitations  Pt not able to tolerate bilat pec major/minor stretch in doorway (W position) due to numbness and tingling in one arm while performing; demonstrated how to perform on one side with wall and rotating away while keeping UE below 90 deg flexion/ABD; pt better able to tolerate             PT Education - 02/26/20 1833    Education Details  hand out on vestibular migraine; plan for next visit - TDN, possible hold on therapy if she doesn't respond to TDN or aquatic until she starts injections.  Advised pt to hold on vestibular HEP for now and just focus on aerobic with walking program each day if able.    Person(s) Educated  Patient    Methods  Explanation    Comprehension  Verbalized understanding       PT Short Term Goals - 02/02/20 2053      PT SHORT TERM GOAL #1   Title  Pt will be independent with initial HEP    Time  6    Period  Weeks    Status  New    Target Date  03/18/20      PT SHORT TERM GOAL #2   Title  Pt will participate in assessment of cervical ROM and muscle tension with LTG to be set    Time  6    Period  Weeks    Status  New    Target Date  03/18/20      PT SHORT TERM GOAL #3   Title  Pt will demonstrate decreased falls risk during gait as indicated by 2 point improvement in FGA    Baseline  26/30    Time  6    Period  Weeks    Status  New    Target Date  03/18/20      PT SHORT TERM GOAL #4   Title  Pt will report decrease in symptoms of dizziness when performing head movements in various directions and when performing quick body turns    Time  6    Period  Weeks    Status  New    Target Date  03/18/20        PT Long Term Goals - 02/08/20 1705      PT LONG TERM GOAL #1   Title  Pt will demonstrate independence with final vestibular, balance and aerobic HEP  (LTG due by 05/02/2020)    Time  12    Period  Weeks     Status  New      PT LONG TERM GOAL #2   Title  Pt will demonstrate no falls  risk during gait as indicated by score of 30/30 on FGA    Time  12    Period  Weeks    Status  New      PT LONG TERM GOAL #3   Title  Pt will report 50% reduction in swaying sensation when taking a shower, when at work or when sitting still in her car    Time  12    Period  Weeks    Status  New      PT LONG TERM GOAL #4   Title  Pt will demonstrate 10 deg increase in cervical rotation and side bending and report </= 3/10 HA and neck pain on average each week    Baseline  sidebending: 30 deg bilaterally, rotation 60 deg bilaterally  - pain with L sidebending and L rotation    Time  12    Period  Weeks    Status  Revised      PT LONG TERM GOAL #5   Title  Pt will increase FOTO to >/= 79% and DHI will decrease by 18 points overall    Baseline  65%; 56 [place]    Time  12    Period  Weeks    Status  New            Plan - 02/26/20 1848    Clinical Impression Statement  Pt continues to demonstrate poor tolerance to exercises and continues to report no change in sudden increase in severity and intensity of symptoms.  Reviewed triggers and the only change over the past two weeks is the addition of vestibular rehab and HEP.  Pt would like to continue to work with therapy and is willing to try dry needling and aquatic therapy.  Therapy will continue to assess patient's response to treatment interventions and decide if pt would benefit from going on hold from therapy.  Advised pt to continue to use walking and safe social interaction as ways to cope with increase in stress and anxiety.  Pt verbalized agreement with plan.    Personal Factors and Comorbidities  Comorbidity 3+;Behavior Pattern;Past/Current Experience;Profession    Comorbidities  migraines, ADHD, generalized anxiety disorder, IBS, B12 deficiency, not currently taking medication to manage migraines - previous medication had too strong side effects     Examination-Activity Limitations  Locomotion Level;Stand;Sit    Examination-Participation Restrictions  Cleaning;Community Activity;Driving;Shop    Stability/Clinical Decision Making  Evolving/Moderate complexity    Rehab Potential  Fair    PT Frequency  1x / week    PT Duration  12 weeks    PT Treatment/Interventions  ADLs/Self Care Home Management;Aquatic Therapy;Canalith Repostioning;Cryotherapy;Moist Heat;Gait training;Stair training;Functional mobility training;Therapeutic activities;Therapeutic exercise;Balance training;Neuromuscular re-education;Patient/family education;Manual techniques;Passive range of motion;Dry needling;Vestibular    PT Next Visit Plan  TDN on Monday - suboccipital, trap, levator.  Aquatic therapy for stress relief, posture and HEP.  Progress land HEP as pt is able to tolerate - x1 viewing in supported sitting, vestibular habituation, thoracic spine mobilization, posture, incorporate aerobic exercise on treadmill.    Consulted and Agree with Plan of Care  Patient       Patient will benefit from skilled therapeutic intervention in order to improve the following deficits and impairments:  Dizziness, Pain, Postural dysfunction, Decreased range of motion, Decreased balance, Difficulty walking  Visit Diagnosis: Dizziness and giddiness  Cervicalgia  Unsteadiness on feet  Chronic tension-type headache, intractable  Abnormal posture     Problem List Patient Active Problem List   Diagnosis  Date Noted  . Dysphagia   . Anterior chest wall pain 08/12/2019  . Dizziness on standing 05/24/2019  . Rapid palpitations 05/24/2019  . Adjustment disorder with anxious mood 04/02/2019  . Initiation of oral contraception 09/21/2018    Rico Junker, PT, DPT 02/26/20    6:55 PM  [facility] [address] [place], [place], [address] Phone: [phone]   Fax:  [phone]  Name: Melanie Jordan MRN:  [id number] Date of Birth: [birth year]

## 2020-02-28 ENCOUNTER — Ambulatory Visit: Payer: 59 | Admitting: Physical Therapy

## 2020-02-28 ENCOUNTER — Other Ambulatory Visit: Payer: Self-pay

## 2020-02-28 ENCOUNTER — Encounter: Payer: Self-pay | Admitting: Physical Therapy

## 2020-02-28 DIAGNOSIS — R42 Dizziness and giddiness: Secondary | ICD-10-CM | POA: Diagnosis not present

## 2020-02-28 DIAGNOSIS — R2681 Unsteadiness on feet: Secondary | ICD-10-CM

## 2020-02-28 DIAGNOSIS — G44221 Chronic tension-type headache, intractable: Secondary | ICD-10-CM

## 2020-02-28 DIAGNOSIS — R293 Abnormal posture: Secondary | ICD-10-CM | POA: Diagnosis not present

## 2020-02-28 DIAGNOSIS — M542 Cervicalgia: Secondary | ICD-10-CM | POA: Diagnosis not present

## 2020-02-28 NOTE — Therapy (Signed)
[facility] [address] [place], [place], [address] Phone: [phone]   Fax:  [phone]  Physical Therapy Treatment  Patient Details  Name: Melanie Jordan MRN: [id number] Date of Birth: [birth year] Referring Provider (PT): Ray Church, MD   Encounter Date: 02/28/2020  PT End of Session - 02/28/20 2254    Visit Number  5    Number of Visits  13    Date for PT Re-Evaluation  05/02/20    Authorization Type  [employer] Employee - [employer]    PT Start Time  [id number]    PT Stop Time  1105    PT Time Calculation (min)  47 min    Activity Tolerance  No increased pain;Patient tolerated treatment well    Behavior During Therapy  Anxious       Past Medical History:  Diagnosis Date  . ADHD    now off Adderal   . Allergy   . Anxiety   . Chronic head pain    taking Metoprolol  . GERD (gastroesophageal reflux disease)   . IBS (irritable bowel syndrome)     Past Surgical History:  Procedure Laterality Date  . ESOPHAGEAL MANOMETRY N/A 10/13/2019   Procedure: ESOPHAGEAL MANOMETRY (EM);  Surgeon: Mauri Pole, MD;  Location: [facility];  Service: Endoscopy;  Laterality: N/A;    There were no vitals filed for this visit.  Subjective Assessment - 02/28/20 1021    Subjective  Just came off of a weekend of work; lots of patients having falls.  Pressure in her head today, some dizziness too.    Pertinent History  migraines, ADHD, generalized anxiety disorder, IBS, B12 deficiency    Diagnostic tests  cervical MRI, LUE nerve conduction study - all WFL    Patient Stated Goals  decrease HA and dizziness    Currently in Pain?  Yes    Pain Score  4     Pain Location  Head    Pain Descriptors / Indicators  Pressure                       [facility] Adult PT Treatment/Exercise - 02/28/20 2245      Therapeutic Activites    Therapeutic Activities  Other Therapeutic Activities    Other Therapeutic  Activities  reviewed education regarding TDN and obtained consent; discussed plan for next two visits      Exercises   Exercises  Neck      Modalities   Modalities  Cryotherapy      Cryotherapy   Number Minutes Cryotherapy  5 Minutes    Cryotherapy Location  Cervical    Type of Cryotherapy  Ice massage      Neck Exercises: Stretches   Upper Trapezius Stretch  Left;1 rep;30 seconds    Upper Trapezius Stretch Limitations  following TDN    Lower Cervical/Upper Thoracic Stretch  30 seconds;1 rep    Lower Cervical/Upper Thoracic Stretch Limitations  following TDN      Vestibular Treatment/Exercise - 02/28/20 2248      Vestibular Treatment/Exercise   Vestibular Treatment Provided  Habituation    Habituation Exercises  Comment;Seated Vertical Head Turns      Seated Vertical Head Turns   Number of Reps   2    Symptom Description   20 seconds each: seated on physioball performing gentle bouncing x 20 seconds each for otolithic organ stimulation with mild dizziness reported after stopping  Trigger Point Dry Needling - 02/28/20 1053    Consent Given?  Yes    Education Handout Provided  Previously provided    Muscles Treated Head and Neck  Upper trapezius;Splenius capitus;Semispinalis capitus;Cervical multifidi    Dry Needling Comments  Performed in prone on L side.  After performing upper trap pt reported nausea.  Prior to performing cervical paraspinals placed cool wash cloth on neck and performed ice massage to L upper trap to decrease symptoms of nausea.  While performing TDN of paraspinals pt reported tight feeling in throat and reproduced her dizziness.  Also performed ice massage of paraspinal muscles after TDN    Upper Trapezius Response  Twitch reponse elicited    Splenius capitus Response  Twitch reponse elicited    Semispinalis capitus Response  Twitch reponse elicited    Cervical multifidi Response  Twitch reponse elicited           PT Education - 02/28/20 2253     Education Details  TDN, stretches and bouncing on physioball    Person(s) Educated  Patient    Methods  Explanation;Demonstration    Comprehension  Verbalized understanding;Returned demonstration       PT Short Term Goals - 02/02/20 2053      PT SHORT TERM GOAL #1   Title  Pt will be independent with initial HEP    Time  6    Period  Weeks    Status  New    Target Date  03/18/20      PT SHORT TERM GOAL #2   Title  Pt will participate in assessment of cervical ROM and muscle tension with LTG to be set    Time  6    Period  Weeks    Status  New    Target Date  03/18/20      PT SHORT TERM GOAL #3   Title  Pt will demonstrate decreased falls risk during gait as indicated by 2 point improvement in FGA    Baseline  26/30    Time  6    Period  Weeks    Status  New    Target Date  03/18/20      PT SHORT TERM GOAL #4   Title  Pt will report decrease in symptoms of dizziness when performing head movements in various directions and when performing quick body turns    Time  6    Period  Weeks    Status  New    Target Date  03/18/20        PT Long Term Goals - 02/08/20 1705      PT LONG TERM GOAL #1   Title  Pt will demonstrate independence with final vestibular, balance and aerobic HEP  (LTG due by 05/02/2020)    Time  12    Period  Weeks    Status  New      PT LONG TERM GOAL #2   Title  Pt will demonstrate no falls risk during gait as indicated by score of 30/30 on FGA    Time  12    Period  Weeks    Status  New      PT LONG TERM GOAL #3   Title  Pt will report 50% reduction in swaying sensation when taking a shower, when at work or when sitting still in her car    Time  12    Period  Weeks    Status  New  PT LONG TERM GOAL #4   Title  Pt will demonstrate 10 deg increase in cervical rotation and side bending and report </= 3/10 HA and neck pain on average each week    Baseline  sidebending: 30 deg bilaterally, rotation 60 deg bilaterally  - pain with L  sidebending and L rotation    Time  12    Period  Weeks    Status  Revised      PT LONG TERM GOAL #5   Title  Pt will increase FOTO to >/= 79% and DHI will decrease by 18 points overall    Baseline  65%; 56 DHI    Time  12    Period  Weeks    Status  New            Plan - 02/28/20 2254    Clinical Impression Statement  Utilized TDN, ice massage and stretches to address increase in muscle tension and trigger points on L side of neck and shoulder today.  Dry needling of paraspinals of upper/mid cervical spine reproduced many of patient's symptoms.  Performed physioball bouncing for continued habituation training.  Pt reported no significant increase in HA or dizziness by end of session.  Will assess patient response at next session.  Will continue to address and progress towards LTG.    Personal Factors and Comorbidities  Comorbidity 3+;Behavior Pattern;Past/Current Experience;Profession    Comorbidities  migraines, ADHD, generalized anxiety disorder, IBS, B12 deficiency, not currently taking medication to manage migraines - previous medication had too strong side effects    Examination-Activity Limitations  Locomotion Level;Stand;Sit    Examination-Participation Restrictions  Cleaning;Community Activity;Driving;Shop    Stability/Clinical Decision Making  Evolving/Moderate complexity    Rehab Potential  Fair    PT Frequency  1x / week    PT Duration  12 weeks    PT Treatment/Interventions  ADLs/Self Care Home Management;Aquatic Therapy;Canalith Repostioning;Cryotherapy;Moist Heat;Gait training;Stair training;Functional mobility training;Therapeutic activities;Therapeutic exercise;Balance training;Neuromuscular re-education;Patient/family education;Manual techniques;Passive range of motion;Dry needling;Vestibular    PT Next Visit Plan  How did she tolerate TDN?  continue gentle habituation/walking/body scan.  Aquatic therapy for stress relief, posture and HEP.  Progress land HEP as pt is  able to tolerate - x1 viewing in supported sitting, vestibular habituation, thoracic spine mobilization, posture, incorporate aerobic exercise on treadmill.    Consulted and Agree with Plan of Care  Patient       Patient will benefit from skilled therapeutic intervention in order to improve the following deficits and impairments:  Dizziness, Pain, Postural dysfunction, Decreased range of motion, Decreased balance, Difficulty walking  Visit Diagnosis: Dizziness and giddiness  Cervicalgia  Unsteadiness on feet  Chronic tension-type headache, intractable  Abnormal posture     Problem List Patient Active Problem List   Diagnosis Date Noted  . Dysphagia   . Anterior chest wall pain 08/12/2019  . Dizziness on standing 05/24/2019  . Rapid palpitations 05/24/2019  . Adjustment disorder with anxious mood 04/02/2019  . Initiation of oral contraception 09/21/2018    Rico Junker, PT, DPT 02/28/20    10:59 PM    [facility] [address] [place], [place], [address] Phone: [phone]   Fax:  [phone]  Name: CHARLII SIANEZ MRN: [id number] Date of Birth: 1991/08/16

## 2020-02-28 NOTE — Patient Instructions (Addendum)
Access Code: [email or web address] URL: [email or web address] Date: 02/28/2020 Prepared by: Misty Stanley  Exercises Seated Cervical Sidebending Stretch - 1 x daily - 7 x weekly - 2 sets - 30 second hold Supine Cervical Retraction with Towel - 1 x daily - 7 x weekly - 2 sets - 10 reps Lying on your back - shoulder diagonal pulls - 1 x daily - 5 x weekly - 2 sets - 10 reps Lying down - kettle bell overhead reach - 1 x daily - 5 x weekly - 1 sets - 10 reps Seated Gaze Stabilization with Head Rotation - 1 x daily - 7 x weekly - 2 sets - 15 seconds hold Seated Gaze Stabilization with Head Nod - 1 x daily - 7 x weekly - 2 sets - 15 second hold Standing Pec Stretch at Wall - 1 x daily - 7 x weekly - 3 sets - 20 second hold Standing Lower Cervical and Upper Thoracic Stretch - 1 x daily - 7 x weekly - 3 sets - 20 second hold Bouncing on the Bed - 1 x daily - 7 x weekly - 3 sets - 20 second hold   Trigger Point Dry Needling  . What is Trigger Point Dry Needling (DN)? o DN is a physical therapy technique used to treat muscle pain and dysfunction. Specifically, DN helps deactivate muscle trigger points (muscle knots).  o A thin filiform needle is used to penetrate the skin and stimulate the underlying trigger point. The goal is for a local twitch response (LTR) to occur and for the trigger point to relax. No medication of any kind is injected during the procedure.   . What Does Trigger Point Dry Needling Feel Like?  o The procedure feels different for each individual patient. Some patients report that they do not actually feel the needle enter the skin and overall the process is not painful. Very mild bleeding may occur. However, many patients feel a deep cramping in the muscle in which the needle was inserted. This is the local twitch response.   Marland Kitchen How Will I feel after the treatment? o Soreness is normal, and the onset of soreness may not occur for a few hours. Typically this soreness does not last  longer than two days.  o Bruising is uncommon, however; ice can be used to decrease any possible bruising.  o In rare cases feeling tired or nauseous after the treatment is normal. In addition, your symptoms may get worse before they get better, this period will typically not last longer than 24 hours.   . What Can I do After My Treatment? o Increase your hydration by drinking more water for the next 24 hours. o You may place ice or heat on the areas treated that have become sore, however, do not use heat on inflamed or bruised areas. Heat often brings more relief post needling. o You can continue your regular activities, but vigorous activity is not recommended initially after the treatment for 24 hours. o DN is best combined with other physical therapy such as strengthening, stretching, and other therapies

## 2020-03-01 ENCOUNTER — Other Ambulatory Visit: Payer: Self-pay

## 2020-03-01 ENCOUNTER — Ambulatory Visit (INDEPENDENT_AMBULATORY_CARE_PROVIDER_SITE_OTHER): Payer: 59 | Admitting: Psychiatry

## 2020-03-01 DIAGNOSIS — F411 Generalized anxiety disorder: Secondary | ICD-10-CM

## 2020-03-01 NOTE — Progress Notes (Signed)
      [facility] Counselor/Therapist Progress Note  Patient ID: KARYL PASKINS, MRN: [id number],    Date: 03/01/2020  Time Spent: 60 minutes  11:00am to 12:00noon  Treatment Type: Individual Therapy  Reported Symptoms: anxiety, physical issues(migraine, some dizziness), depression "feeling like I'm not going to get better physically  Mental Status Exam:  Appearance:   Neat     Behavior:  Appropriate and Sharing  Motor:  Normal  Speech/Language:   Clear and Coherent  Affect:  anxious, depressed   Mood:  anxious, depressed and sad  Thought process:  goal directed  Thought content:    WNL  Sensory/Perceptual disturbances:    WNL  Orientation:  oriented to person, place, time/date, situation, day of week, month of year and year  Attention:  Good  Concentration:  Good  Memory:  WNL  Fund of knowledge:   Good  Insight:    Good  Judgment:   Good  Impulse Control:  Good   Risk Assessment: Danger to Self:  No Self-injurious Behavior: No Danger to Others: No Duty to Warn:no Physical Aggression / Violence:No  Access to Firearms a concern: No  Gang Involvement:No   Subjective: Patient today reports frustration that physical problems persist, anxiety, some depression, fears she may not get better.  Interventions: Cognitive Behavioral Therapy and Ego-Supportive  Diagnosis:   ICD-10-CM   1. Generalized anxiety disorder  F41.1      Plan of Care: Patient not signing Tx Plan on computer screen due to COVID-19.  Treatment Goals: Goals will remain on tx plan as patient works on strategies to achieve her goals. Progress will be noted each session in "Progress" section of the Plan.  Long term goal: Reduce overall level, frequency, and intensity of the anxiety so that daily functioning is not impaired.  Short term goal: Verbalize an understanding of the role that fearful thinking plays in creating fears, excessive worry, and persistent anxiety  symptoms.  Strategy: Identify, challenge, and replace fearful self-talk with positive, realistic, and empowering self-talk.  Progress: Patient in today with anxiety, some depression, continue health concerns with migraines, and fears of not getting better as she says she has been dealing with these physical concerns since May 2020.  Emotionally she feels fearful, intermittent hopelessness of getting physically better.  Worries less about fiance leaving her. Processed her anxious/fearful thoughts and frequent feeling "I'm not going to get better."  Together, worked on her being able to respond to her fearful toughts in a more positive way, remembering what doctors have told her about her eventually getting better. Practiced with some of her most frequent anxious/fearful thoughts, interrupting those thoughts and replacing them with more positive, reality-based, hopeful, and empowering thought patterns that do not support anxiety nor fearful thinking. Looks forward and fears the future.  To work on staying in the present and not jumping ahead and making fearful assumptions. Today rates herself a "4-5" on anxiety scale of 1-10.  Goal review and progress/challenges noted with patient.  Next session within 2 weeks.   Shanon Ace, LCSW

## 2020-03-06 ENCOUNTER — Other Ambulatory Visit: Payer: Self-pay

## 2020-03-06 ENCOUNTER — Ambulatory Visit (INDEPENDENT_AMBULATORY_CARE_PROVIDER_SITE_OTHER): Payer: 59 | Admitting: Cardiology

## 2020-03-06 ENCOUNTER — Encounter: Payer: Self-pay | Admitting: Cardiology

## 2020-03-06 VITALS — BP 110/64 | HR 83 | Temp 98.1°F | Ht 67.0 in | Wt 166.0 lb

## 2020-03-06 DIAGNOSIS — R002 Palpitations: Secondary | ICD-10-CM

## 2020-03-06 DIAGNOSIS — R0789 Other chest pain: Secondary | ICD-10-CM

## 2020-03-06 DIAGNOSIS — G43809 Other migraine, not intractable, without status migrainosus: Secondary | ICD-10-CM | POA: Diagnosis not present

## 2020-03-06 DIAGNOSIS — R42 Dizziness and giddiness: Secondary | ICD-10-CM | POA: Diagnosis not present

## 2020-03-06 DIAGNOSIS — F4322 Adjustment disorder with anxiety: Secondary | ICD-10-CM | POA: Diagnosis not present

## 2020-03-06 NOTE — Progress Notes (Signed)
Office Visit Note  Patient: Melanie Jordan             Date of Birth: [birth year]           MRN: [id number]             PCP: Ronnald Nian, DO Referring: Ronnald Nian, DO Visit Date: 03/14/2020 Occupation: @GUAROCC @  Subjective:  New Patient (Initial Visit) (Hypermobile joints)   History of Present Illness: Melanie Jordan is a 29 y.o. female seen in consultation per request of her PCP.  According to patient she has had history of patellar dislocation since she was 29 years old.  She states mostly her patella will go out of place which she will push back in place and the discomfort goes away.  She has never seen an orthopedic surgeon for that.  She is also been experiencing some discomfort in her  elbow joints.  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  She denies any history of joint swelling.  She also has some discomfort in her hip joints and shoulder joints occasionally.  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  She has had no recurrence since then.  She also believes that her mother has Ehlers-Danlos syndrome.  Activities of Daily Living:  Patient reports morning stiffness for 30 minutes.   Patient Reports nocturnal pain.  Difficulty dressing/grooming: Denies Difficulty climbing stairs: Denies Difficulty getting out of chair: Denies Difficulty using hands for taps, buttons, cutlery, and/or writing: Denies  Review of Systems  Constitutional: Positive for fatigue. Negative for night sweats, weight gain and weight loss.  HENT: Negative for mouth sores, trouble swallowing, trouble swallowing, mouth dryness and nose dryness.   Eyes: Positive for dryness. Negative for pain, redness and visual disturbance.  Respiratory: Negative for cough, shortness of breath and difficulty breathing.   Cardiovascular: Negative for chest pain,  palpitations, hypertension, irregular heartbeat and swelling in legs/feet.  Gastrointestinal: Positive for constipation and diarrhea. Negative for blood in stool.  Endocrine: Negative for excessive thirst and increased urination.  Genitourinary: Negative for difficulty urinating and vaginal dryness.  Musculoskeletal: Positive for arthralgias, joint pain, morning stiffness and muscle tenderness. Negative for gait problem, joint swelling, myalgias, muscle weakness and myalgias.  Skin: Negative for color change, rash, hair loss, skin tightness, ulcers and sensitivity to sunlight.  Allergic/Immunologic: Negative for susceptible to infections.  Neurological: Positive for dizziness and numbness. Negative for memory loss, night sweats and weakness.  Hematological: Negative for bruising/bleeding tendency and swollen glands.  Psychiatric/Behavioral: Positive for sleep disturbance. Negative for depressed mood. The patient is nervous/anxious.     PMFS History:  Patient Active Problem List   Diagnosis Date Noted  . Dysphagia   . Anterior chest wall pain 08/12/2019  . Dizziness on standing 05/24/2019  . Rapid palpitations 05/24/2019  . Adjustment disorder with anxious mood 04/02/2019  . Initiation of oral contraception 09/21/2018    Past Medical History:  Diagnosis Date  . ADHD    now off Adderal   . Allergy   . Anxiety   . Chronic head pain    taking Metoprolol  . GERD (gastroesophageal reflux disease)   . IBS (irritable bowel syndrome)   . Migraine     Family History  Problem Relation Age of Onset  . Cancer Mother 85       cervical  and stomach cancer  . Fibroids Mother        uterine fibroid  . Thyroid disease Mother        hypothyroidism due to [place]  . Osteopenia Mother   . GER disease Mother   . Drug abuse Mother   . Stomach cancer Mother   . Anxiety disorder Mother   . Asthma Brother   . Anxiety disorder Brother   . Irritable bowel syndrome Brother   . Arthritis  Maternal Grandmother   . Depression Maternal Grandmother   . Heart disease Maternal Grandmother 63       CAD with stents  . Hyperlipidemia Maternal Grandmother   . Hypertension Maternal Grandmother   . Stroke Maternal Grandmother 72       TIA  . Cancer Paternal Grandmother 66       uterine cancer  . Asthma Brother   . Anxiety disorder Sister   . Depression Father   . ADD / ADHD Maternal Uncle   . Colon cancer Neg Hx   . Esophageal cancer Neg Hx   . Rectal cancer Neg Hx    Past Surgical History:  Procedure Laterality Date  . ESOPHAGEAL MANOMETRY N/A 10/13/2019   Procedure: ESOPHAGEAL MANOMETRY (EM);  Surgeon: Mauri Pole, MD;  Location: [facility];  Service: Endoscopy;  Laterality: N/A;   Social History   Social History Narrative   Engaged.      Started drinking more during the day - up to ~2 L (since onset of dizziness).      NO routine exercise.      Works in [employer] @ [employer] History  Administered Date(s) Administered  . Hpv 05/26/2015  . Influenza,inj,Quad PF,6+ Mos 08/06/2018  . Influenza-Unspecified 08/15/2019  . Tdap 05/26/2015     Objective: Vital Signs: BP 114/70   Pulse 77   Resp 14   Ht 5' 6.75" (1.695 m)   Wt 166 lb (75.3 kg)   BMI 26.19 kg/m    Physical Exam Vitals and nursing note reviewed.  Constitutional:      Appearance: She is well-developed.  HENT:     Head: Normocephalic and atraumatic.  Eyes:     Conjunctiva/sclera: Conjunctivae normal.  Cardiovascular:     Rate and Rhythm: Normal rate and regular rhythm.     Heart sounds: Normal heart sounds.  Pulmonary:     Effort: Pulmonary effort is normal.     Breath sounds: Normal breath sounds.  Abdominal:     General: Bowel sounds are normal.     Palpations: Abdomen is soft.  Musculoskeletal:     Cervical back: Normal range of motion.  Lymphadenopathy:     Cervical: No cervical adenopathy.  Skin:    General: Skin is warm and dry.     Capillary Refill:  Capillary refill takes less than 2 seconds.  Neurological:     Mental Status: She is alert and oriented to person, place, and time.  Psychiatric:        Behavior: Behavior normal.      Musculoskeletal Exam: C-spine thoracic and lumbar spine were in good range of motion.  There was no evidence of scoliosis.  No SI joint tenderness was noted.  Shoulder joints were in good range of motion.  She has hypermobility in her  elbow joints, wrist joints, MCPs PIPs and DIPs.  She is popping with range of motion of her bilateral hip joints.  She has patellar laxity.  She has increased mobility  in her ankle joints.  No synovitis was noted on my examination.  CDAI Exam: CDAI Score: -- Patient Global: --; Provider Global: -- Swollen: --; Tender: -- Joint Exam 03/14/2020   No joint exam has been documented for this visit   There is currently no information documented on the homunculus. Go to the Rheumatology activity and complete the homunculus joint exam.  Investigation: No additional findings.  Imaging: No results found.  Recent Labs: Lab Results  Component Value Date   WBC 6.9 08/03/2019   HGB 14.0 08/03/2019   PLT 281 08/03/2019   NA 136 08/03/2019   K 3.9 10/27/2019   CL 102 08/03/2019   CO2 24 08/03/2019   GLUCOSE 98 08/03/2019   BUN 8 08/03/2019   CREATININE 0.85 08/03/2019   BILITOT 0.6 08/03/2019   ALKPHOS 36 (L) 08/03/2019   AST 15 08/03/2019   ALT 15 08/03/2019   PROT 7.6 08/03/2019   ALBUMIN 4.5 08/03/2019   CALCIUM 9.1 08/03/2019   GFRAA >60 08/03/2019    Speciality Comments: No specialty comments available.  Procedures:  No procedures performed Allergies: Patient has no known allergies.   Assessment / Plan:     Visit Diagnoses: Generalized hypermobility of joints -patient has hypermobility in multiple joints.  There is possible history of ulnar styloid syndrome in her mother.  Joint protection and isometric exercises were discussed.  I will refer her to sports  medicine for hypermobility.  Increased risk of cardiac abnormalities and ocular lens abnormalities were discussed.  She sees a cardiologist on a regular basis and also had eye examinations.  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  No synovitis was noted.  She may benefit from isometric exercises.  I will refer her to physical therapy.  Chronic pain of both knees-she has patellar laxity and knee joint discomfort.  Handout on knee joint exercises was given.  Have also referred her to physical therapy.  History of IBS-she has history of constipation alternating with diarrhea.  History of gastroesophageal reflux (GERD)  History of dysphagia  Rapid palpitations  Adjustment disorder with anxious mood  Vestibular migraine-followed by neurology.  Bilateral occipital neuralgia - Followed by neurologist at [facility]  Orders: Orders Placed This Encounter  Procedures  . AMB referral to sports medicine  . Ambulatory referral to Physical Therapy   No orders of the defined types were placed in this encounter.   Face-to-face time spent with patient was 45 minutes. Greater than 50% of time was spent in counseling and coordination of care.  Follow-Up Instructions: Return if symptoms worsen or fail to improve, for Hypermobility.   Bo Merino, MD  Note - This record has been created using Editor, commissioning.  Chart creation errors have been sought, but may not always  have been located. Such creation errors do not reflect on  the standard of medical care.

## 2020-03-06 NOTE — Progress Notes (Signed)
Primary Care Provider: Ronnald Nian, DO Cardiologist: [name], MD Electrophysiologist: None  Clinic Note: Chief Complaint  Patient presents with  . Follow-up    echo results  . Palpitations    Still present also dizziness   HPI:    Melanie Jordan is a 29 y.o. female with a PMH below who presents today for 77-month follow-up.  Melanie Jordan was initially seen in consultation for near syncope and palpitations in June 2020.  She had gone to the emergency room with complaint of sharp left-sided chest pain, dizziness with rapid heart rate and diaphoresis.  Was noted to be hypokalemic. -> Noted episodes of rapid heart rate and dizziness some associated chest tightness feeling pale and sweaty with near syncope.  At least one occasion she barely got to the ground before she almost blacked out.  She also notes fatigue.  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  Was trying to make sure to drink plenty of water.  Initial evaluation was with cardiac event monitor that showed mostly sinus rhythm rates ranging from 43 to 166bpm (as sinus tachycardia).  No sustained arrhythmias.  Symptoms with both sinus rhythm and sinus rhythm with occasional PVCs.   She was then seen in follow-up on August 09 2019 (most recent visit) indicated that she was diagnosed with vestibular migraines that may explain some of the dizziness.  She was still noticing off-and-on palpitations but better.  Was now noting left upper chest discomfort along her sternum and under her breast.  Off and on with certain movements for 2 to 3 days.  Describes a short stabbing discomfort worse with inspiration.  Chest pain thought to be musculoskeletal, probably costochondritis.  Discussed treatment with Tylenol or Motrin.  Recommended adequate hydration for dizziness.  Did not feel that Adderall was related to her palpitations.  Decided not to treat with medications.   Discussed adequate hydration and liberalization of salt.  Also eating foods high in potassium to avoid hypokalemia.  --> In addition to vestibular migraines, she is also been diagnosed with anxiety for which she is now doing counseling rehab.  She had been started on propranolol for headaches, and thought that it could potentially help her palpitations. -->  She indicated that it did not help her headaches or palpitations she is therefore not taking it.  Xanax helps more.  Recent Hospitalizations:   None  Reviewed  CV studies:    The fo llowing studies were reviewed today: (if available, images/films reviewed: From Epic Chart or Care Everywhere) . (11/04/2019) ETT: Exercised 9:13 min. 10.2 METS. Peak HR 173 bpm (89% MPHR of 194 bpm). Peak BP 204/95 mmHg. no significant arrhythmia.  No EKG changes to suggest ischemia.  Good exercise tolerance, no ischemia.  NORMAL/NEGATIVE STRESS TEST.  LOW RISK   Interval History:   Melanie Jordan returns here today still noting that she has a palpitation spells where she feels that her heart rate will go fast but less prominent.  As her anxiety level is improved with her counseling, these episodes seem to be improving.  Xanax seems to be more effective than propranolol.  She also still has off-and-on episodes of chest pressure but not associated with exertion.  Usually at rest.  She has rare dizziness and migraines not always together.  She is just concerned about would be causing her dizziness.  Interestingly though she doesn't have the dizziness associated with  her irregular heartbeats.  CV Review of Symptoms (Summary) Cardiovascular ROS: positive for - chest pain, palpitations, rapid heart rate and Dizziness and headaches with nausea negative for - dyspnea on exertion, edema, orthopnea, paroxysmal nocturnal dyspnea, shortness of breath or Syncope and no recent near syncope, TIA/amaurosis fugax or claudication  The patient does not have symptoms  concerning for COVID-19 infection (fever, chills, cough, or new shortness of breath).  The patient is practicing social distancing & Masking.    REVIEWED OF SYSTEMS   Review of Systems  Constitutional: Positive for malaise/fatigue. Negative for weight loss.  Gastrointestinal: Positive for nausea. Negative for blood in stool and melena.  Genitourinary: Negative for hematuria.  Musculoskeletal: Negative for falls.  Neurological: Positive for dizziness and headaches.  Psychiatric/Behavioral: The patient is nervous/anxious.      I have reviewed and (if needed) personally updated the patient's problem list, medications, allergies, past medical and surgical history, social and family history.   PAST MEDICAL HISTORY   Past Medical History:  Diagnosis Date  . ADHD    now off Adderal   . Allergy   . Anxiety   . Chronic head pain    taking Metoprolol  . GERD (gastroesophageal reflux disease)   . IBS (irritable bowel syndrome)     PAST SURGICAL HISTORY   Past Surgical History:  Procedure Laterality Date  . ESOPHAGEAL MANOMETRY N/A 10/13/2019   Procedure: ESOPHAGEAL MANOMETRY (EM);  Surgeon: Mauri Pole, MD;  Location: [facility];  Service: Endoscopy;  Laterality: N/A;    MEDICATIONS/ALLERGIES   Current Meds  Medication Sig  . Acetaminophen (TYLENOL PO) Take by mouth as needed.  . ALPRAZolam (XANAX) 0.25 MG tablet Take 1 tablet (0.25 mg total) by mouth daily as needed for anxiety.  . Cetirizine HCl (ZYRTEC PO) Take by mouth daily as needed.   . Cholecalciferol (VITAMIN D3) 25 MCG (1000 UT) CAPS Take 1 capsule by mouth daily.  . cyclobenzaprine (FLEXERIL) 5 MG tablet Take 5 mg by mouth 3 (three) times daily.  Marland Kitchen FAMOTIDINE PO Take by mouth daily as needed.   . Fremanezumab-vfrm 225 MG/1.5ML SOAJ Inject into the skin.  . hyoscyamine (LEVSIN SL) 0.125 MG SL tablet Place 1 tablet (0.125 mg total) under the tongue every 4 (four) hours as needed.  Lenda Kelp FE 1/20 1-20  MG-MCG tablet TAKE 1 TABLET BY MOUTH EVERY DAY  . Multiple Vitamins-Minerals (MULTIVITAMIN ADULTS PO) Take by mouth daily.  . promethazine (PHENERGAN) 25 MG tablet TAKE 1 TABLET (25 MG TOTAL) BY MOUTH EVERY 8 (EIGHT) HOURS AS NEEDED FOR NAUSEA FOR UP TO 90 DAYS  . vitamin B-12 (CYANOCOBALAMIN) 1000 MCG tablet Take 1,000 mcg by mouth daily.  . [DISCONTINUED] propranolol (INDERAL) 40 MG tablet Take by mouth.    No Known Allergies  SOCIAL HISTORY/FAMILY HISTORY   Reviewed in Epic:  Pertinent findings: N/A  OBJCTIVE -PE, EKG, labs   Wt Readings from Last 3 Encounters:  03/06/20 166 lb (75.3 kg)  01/06/20 158 lb (71.7 kg)  12/29/19 158 lb 12.8 oz (72 kg)    Physical Exam: BP 110/64   Pulse 83   Temp 98.1 F (36.7 C)   Ht 5\' 7"  (1.702 m)   Wt 166 lb (75.3 kg)   SpO2 98%   BMI 26.00 kg/m  Physical Exam  Constitutional: She is oriented to person, place, and time. She appears well-developed and well-nourished. No distress.  HENT:  Head: Normocephalic and atraumatic.  Cardiovascular: Normal rate, regular rhythm, normal heart  sounds and intact distal pulses. Exam reveals no gallop and no friction rub.  No murmur heard. Pulmonary/Chest: Effort normal.  Musculoskeletal:        General: No edema. Normal range of motion.  Neurological: She is oriented to person, place, and time.  Psychiatric: Her behavior is normal. Judgment and thought content normal.  Somewhat flat affect, but seems to be focused on certain symptoms not to the point of perseveration.  Vitals reviewed.    Adult ECG Report N/a  Recent Labs:   Lab Results  Component Value Date   CHOL 125 09/21/2018   HDL 47.30 09/21/2018   LDLCALC 65 09/21/2018   TRIG 60.0 09/21/2018   CHOLHDL 3 09/21/2018   Lab Results  Component Value Date   CREATININE 0.85 08/03/2019   BUN 8 08/03/2019   NA 136 08/03/2019   K 3.9 10/27/2019   CL 102 08/03/2019   CO2 24 08/03/2019   Lab Results  Component Value Date   TSH 3.03  11/24/2019    ASSESSMENT/PLAN    Problem List Items Addressed This Visit    Anterior chest wall pain    She still has these episodes of chest pain that sound more musculoskeletal in nature.  It occurs at rest and deep breathing.  Even associated with turning certain directions.  I reassured her that based on her treadmill stress test results, it is very unlikely that her symptoms are related to coronary artery disease, especially given her young age.  Again suggested use of Tylenol Motrin.      Adjustment disorder with anxious mood    She does have a lot of anxiety and stress which probably is driving her tachycardia symptoms.  Is also driving her chest discomfort.  Complicating factors are that she has migraines and is dizzy spells. None of these are a primary cardiac etiology.  Her tachycardia is probably more related to anxiety as there is no significant findings on her monitor despite her noting symptoms.      Dizziness on standing    She doesn't really have POTS from a standpoint of tachycardia.  Regardless, the most prominent treatment option is hydration, salt liberalization also ensuring that she has enough potassium. Since he does have a swelling sure how much help she would get from compression stockings.      Rapid palpitations - Primary    She only really had sinus tachycardia on the monitor and didn't appear to be in times of symptoms.  Rare PAC noted. This would probably explain why her symptoms did not benefit from the addition of beta-blocker.          COVID-19 Education: The signs and symptoms of COVID-19 were discussed with the patient and how to seek care for testing (follow up with PCP or arrange E-visit).   The importance of social distancing was discussed today.  I spent a total of 71minutes with the patient. >  50% of the time was spent in direct patient consultation.  Additional time spent with chart review  / charting (studies, outside notes, etc):  8 Total Time: 26 min   Current medicines are reviewed at length with the patient today.  (+/- concerns) none  Notice: This dictation was prepared with Dragon dictation along with smaller phrase technology. Any transcriptional errors that result from this process are unintentional and may not be corrected upon review.  Patient Instructions / Medication Changes & Studies & Tests Ordered   Patient Instructions  Medication Instructions:  No changes   *  If you need a refill on your cardiac medications before your next appointment, please call your pharmacy*   Lab Work: Not needed   Testing/Procedures: Not needed   Follow-Up: At [facility], you and your health needs are our priority.  As part of our continuing mission to provide you with exceptional heart care, we have created designated Provider Care Teams.  These Care Teams include your primary Cardiologist (physician) and Advanced Practice Providers (APPs -  Physician Assistants and Nurse Practitioners) who all work together to provide you with the care you need, when you need it.    Your next appointment:   8 month(s) Dec 2021  The format for your next appointment:   In Person  Provider:   [name], MD   Other Instructions n/a    Studies Ordered:   No orders of the defined types were placed in this encounter.    [name], M.D., M.S. Interventional Cardiologist   Pager # [phone] Phone # [phone] [address] [place], [place] [address]   Thank you for choosing Heartcare at [facility]!!

## 2020-03-06 NOTE — Patient Instructions (Signed)
Medication Instructions:  No changes   *If you need a refill on your cardiac medications before your next appointment, please call your pharmacy*   Lab Work: Not needed   Testing/Procedures: Not needed   Follow-Up: At [facility], you and your health needs are our priority.  As part of our continuing mission to provide you with exceptional heart care, we have created designated Provider Care Teams.  These Care Teams include your primary Cardiologist (physician) and Advanced Practice Providers (APPs -  Physician Assistants and Nurse Practitioners) who all work together to provide you with the care you need, when you need it.    Your next appointment:   8 month(s) Dec 2021  The format for your next appointment:   In Person  Provider:   [name], MD   Other Instructions n/a

## 2020-03-07 ENCOUNTER — Encounter: Payer: 59 | Admitting: Physical Therapy

## 2020-03-08 ENCOUNTER — Encounter: Payer: Self-pay | Admitting: Psychiatry

## 2020-03-08 ENCOUNTER — Ambulatory Visit (INDEPENDENT_AMBULATORY_CARE_PROVIDER_SITE_OTHER): Payer: 59 | Admitting: Psychiatry

## 2020-03-08 ENCOUNTER — Ambulatory Visit: Payer: Self-pay | Admitting: Physical Therapy

## 2020-03-08 ENCOUNTER — Other Ambulatory Visit: Payer: Self-pay

## 2020-03-08 VITALS — BP 131/85 | HR 70 | Wt 166.0 lb

## 2020-03-08 DIAGNOSIS — F411 Generalized anxiety disorder: Secondary | ICD-10-CM | POA: Diagnosis not present

## 2020-03-08 NOTE — Progress Notes (Signed)
Lanice Branda Eddinger [id number] Aug 18, 1991 29 y.o.  Subjective:   Patient ID:  Melanie Jordan is a 29 y.o. (DOB [birth year]) female.  Chief Complaint:  Chief Complaint  Patient presents with  . Follow-up    h/o Depression, Anxiety, and insomnia    HPI Melanie Jordan presents to the office today for follow-up of anxiety and ADHD. She reports that propranolol was not helpful for anxiety and caused her to feel weak and light-headed, and then stopped it. Now taking only prn for palpitations. Reports that palpitations tend to occur more at rest or when she is talking more. Reports migraines and vertigo daily.  Anxiety has been "ok, not as bad as it used to be." Occ panic-like s/s, typically after having caffeine. Has been noticing more depression which she reports is in response to physical s/s. She reports that her mood is "up and down" and worse at night. Sleep has been normal. Appetite is stable. Energy has been ok. Motivation has been low. She reports difficulty focusing at home and work. Reports that she is having trouble paying attention during conversations. Denies anhedonia. Denies diminished interest in things. Had some passive death wishes a few weeks ago. Denies SI.   Does not take Xanax prn daily. Takes mostly prn at night.   Past Psychiatric Medication Trials: Nortriptyline- Caused depression and SI. Effexor- "Felt not connected to body." Felt fearful. Prozac- Felt jittery but less anxious. HR decreased in combination with Metoprolol Viibryd- worsening derealization Buspar- "felt like I was drunk." Wellbutrin- had frequent tearfulness Adderall- Had increased HR. Hyper-focus on IR.  Mydayis- Best tolerated Adzenys- Anger, nausea, wt loss Xanax- Effective for panic attacks Metoprolol- was prescribed by neurologist for migraines and was advised Propranolol- Had dizziness and lightheadedness. Has taken prn migraines.    GAD-7     Office Visit from 04/02/2019 in [facility]  Total GAD-7 Score  8    PHQ2-9     Office Visit from 04/02/2019 in [place] [place] Visit from 09/21/2018 in [place]  PHQ-2 Total Score  0  0  PHQ-9 Total Score  4  --       Review of Systems:  Review of Systems  Cardiovascular:       Reports cardiac work-up has been WNL.  Musculoskeletal: Positive for back pain and neck pain. Negative for gait problem.       Has been seeing PT.  Also having shoulder pain  Neurological: Positive for dizziness, light-headedness and headaches. Negative for tremors.  Psychiatric/Behavioral:       Please refer to HPI    Medications: I have reviewed the patient's current medications.  Current Outpatient Medications  Medication Sig Dispense Refill  . Acetaminophen (TYLENOL PO) Take by mouth as needed.    . ALPRAZolam (XANAX) 0.25 MG tablet Take 1 tablet (0.25 mg total) by mouth daily as needed for anxiety. 15 tablet 0  . Cholecalciferol (VITAMIN D3) 25 MCG (1000 UT) CAPS Take 1 capsule by mouth daily.    Marland Kitchen ibuprofen (ADVIL) 200 MG tablet Take 200 mg by mouth every 6 (six) hours as needed.    . JUNEL FE 1/20 1-20 MG-MCG tablet TAKE 1 TABLET BY MOUTH EVERY DAY 3 Package 3  . Multiple Vitamins-Minerals (MULTIVITAMIN ADULTS PO) Take by mouth daily.    . vitamin B-12 (CYANOCOBALAMIN) 1000 MCG tablet Take 1,000 mcg by mouth daily.    . Cetirizine HCl (ZYRTEC PO) Take by mouth daily as  needed.     . cyclobenzaprine (FLEXERIL) 5 MG tablet Take 5 mg by mouth 3 (three) times daily.    Marland Kitchen FAMOTIDINE PO Take by mouth daily as needed.     . Fremanezumab-vfrm 225 MG/1.5ML SOAJ Inject into the skin.    . hyoscyamine (LEVSIN SL) 0.125 MG SL tablet Place 1 tablet (0.125 mg total) under the tongue every 4 (four) hours as needed. 60 tablet 3  . promethazine (PHENERGAN) 25 MG tablet TAKE 1 TABLET (25 MG TOTAL) BY MOUTH EVERY 8 (EIGHT) HOURS AS NEEDED FOR NAUSEA FOR UP TO 90 DAYS     No current  facility-administered medications for this visit.    Medication Side Effects: Other: N/A  Allergies: No Known Allergies  Past Medical History:  Diagnosis Date  . ADHD    now off Adderal   . Allergy   . Anxiety   . Chronic head pain    taking Metoprolol  . GERD (gastroesophageal reflux disease)   . IBS (irritable bowel syndrome)     Family History  Problem Relation Age of Onset  . Cancer Mother 30       cervical and stomach cancer  . Fibroids Mother        uterine fibroid  . Thyroid disease Mother        hypothyroidism due to [place]  . Osteopenia Mother   . GER disease Mother   . Drug abuse Mother   . Stomach cancer Mother   . Anxiety disorder Mother   . Asthma Brother   . Anxiety disorder Brother   . Irritable bowel syndrome Brother   . Arthritis Maternal Grandmother   . Depression Maternal Grandmother   . Heart disease Maternal Grandmother 63       CAD with stents  . Hyperlipidemia Maternal Grandmother   . Hypertension Maternal Grandmother   . Stroke Maternal Grandmother 72       TIA  . Cancer Paternal Grandmother 80       uterine cancer  . Asthma Brother   . Anxiety disorder Sister   . Depression Father   . ADD / ADHD Maternal Uncle   . Colon cancer Neg Hx   . Esophageal cancer Neg Hx   . Rectal cancer Neg Hx     Social History   Socioeconomic History  . Marital status: Single    Spouse name: Not on file  . Number of children: 0  . Years of education: Not on file  . Highest education level: Associate degree: occupational, Hotel manager, or vocational program  Occupational History  . 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 Smoking status: Never Smoker  . Smokeless tobacco: Never Used  Substance and Sexual Activity  . Alcohol use: Not Currently    Comment: social  . Drug use: Never  . Sexual activity: Yes    Birth control/protection: Pill  Other Topics Concern  . Not on file  Social History  Narrative   Engaged.      Started drinking more during the day - up to ~2 L (since onset of dizziness).      NO routine exercise.      Works in [employer] @ [employer] Strain:   . Difficulty of Paying Living Expenses:   Food Insecurity:   . Worried About Charity fundraiser in the Last Year:   . Ran  Out of Food in the Last Year:   Transportation Needs:   . Lack of Transportation (Medical):   Marland Kitchen Lack of Transportation (Non-Medical):   Physical Activity:   . Days of Exercise per Week:   . Minutes of Exercise per Session:   Stress:   . Feeling of Stress :   Social Connections:   . Frequency of Communication with Friends and Family:   . Frequency of Social Gatherings with Friends and Family:   . Attends Religious Services:   . Active Member of Clubs or Organizations:   . Attends Archivist Meetings:   Marland Kitchen Marital Status:   Intimate Partner Violence:   . Fear of Current or Ex-Partner:   . Emotionally Abused:   Marland Kitchen Physically Abused:   . Sexually Abused:     Past Medical History, Surgical history, Social history, and Family history were reviewed and updated as appropriate.   Please see review of systems for further details on the patient's review from today.   Objective:   Physical Exam:  BP 131/85   Pulse 70   Wt 166 lb (75.3 kg)   BMI 26.00 kg/m   Physical Exam Constitutional:      General: She is not in acute distress. Musculoskeletal:        General: No deformity.  Neurological:     Mental Status: She is alert and oriented to person, place, and time.     Coordination: Coordination normal.  Psychiatric:        Attention and Perception: Attention and perception normal. She does not perceive auditory or visual hallucinations.        Mood and Affect: Mood is not anxious. Affect is not labile, blunt, angry or inappropriate.        Speech: Speech normal.        Behavior: Behavior normal.        Thought  Content: Thought content normal. Thought content is not paranoid or delusional. Thought content does not include homicidal or suicidal ideation. Thought content does not include homicidal or suicidal plan.        Cognition and Memory: Cognition and memory normal.        Judgment: Judgment normal.     Comments: Insight intact Mildly depressed mood     Lab Review:     Component Value Date/Time   NA 136 08/03/2019 1130   K 3.9 10/27/2019 1623   CL 102 08/03/2019 1130   CO2 24 08/03/2019 1130   GLUCOSE 98 08/03/2019 1130   BUN 8 08/03/2019 1130   CREATININE 0.85 08/03/2019 1130   CREATININE 0.73 04/12/2019 1450   CALCIUM 9.1 08/03/2019 1130   PROT 7.6 08/03/2019 1130   ALBUMIN 4.5 08/03/2019 1130   AST 15 08/03/2019 1130   ALT 15 08/03/2019 1130   ALKPHOS 36 (L) 08/03/2019 1130   BILITOT 0.6 08/03/2019 1130   GFRNONAA >60 08/03/2019 1130   GFRAA >60 08/03/2019 1130       Component Value Date/Time   WBC 6.9 08/03/2019 1130   RBC 4.59 08/03/2019 1130   HGB 14.0 08/03/2019 1130   HCT 43.1 08/03/2019 1130   PLT 281 08/03/2019 1130   MCV 93.9 08/03/2019 1130   MCH 30.5 08/03/2019 1130   MCHC 32.5 08/03/2019 1130   RDW 12.2 08/03/2019 1130   LYMPHSABS 2.6 08/03/2019 1130   MONOABS 0.4 08/03/2019 1130   EOSABS 0.1 08/03/2019 1130   BASOSABS 0.0 08/03/2019 1130    No results found for: POCLITH,  LITHIUM   No results found for: PHENYTOIN, PHENOBARB, VALPROATE, CBMZ   .res Assessment: Plan:   Patient seen for 30 minutes and time spent reviewing pharmacogenetics testing results and discussing possible treatment options for depression and anxiety.  Discussed that sertraline or Trintellix may be possible treatment options.  Discussed that sertraline may be helpful for mood and anxiety, with minimal risk of cardiac side effects.  Discussed that Trintellix would have low likelihood of causing affective dulling and may also help with cognitive processing speed.  Patient reports that  she is also considering restarting treatment for attention deficit disorder and has upcoming appointment with Dr. Rachel Moulds at [facility] attention specialists.  Discussed option of starting with treatment for attention deficit or medication for mood and anxiety, and patient reports that she would prefer to start with treatment of ADD signs and symptoms.  We will therefore hold off on making any additional changes at this time.  Discussed patient following up on an as needed basis. Patient advised to contact office with any questions, adverse effects, or acute worsening in signs and symptoms.  Beva was seen today for follow-up.  Diagnoses and all orders for this visit:  Generalized anxiety disorder     Please see After Visit Summary for patient specific instructions.  Future Appointments  Date Time Provider [place]  03/14/2020  8:45 AM Bo Merino, MD [facility] None  03/15/2020 10:00 AM Shanon Ace, LCSW [facility] None  03/16/2020  8:45 AM Rico Junker, PT [facility]  03/20/2020  1:30 PM Nita Sells T, PTA [facility]  03/29/2020 10:00 AM Shanon Ace, LCSW [facility] None  03/30/2020 11:00 AM Rico Junker, PT [facility]  04/03/2020 10:30 AM Bo Merino, MD [facility] None  04/03/2020  1:30 PM Lesli Albee, PTA [facility]  04/06/2020 11:00 AM Rico Junker, PT [facility]  04/12/2020  1:00 PM Shanon Ace, LCSW [facility] None    No orders of the defined types were placed in this encounter.   -------------------------------

## 2020-03-10 ENCOUNTER — Encounter: Payer: Self-pay | Admitting: Family Medicine

## 2020-03-10 ENCOUNTER — Encounter: Payer: Self-pay | Admitting: Cardiology

## 2020-03-10 DIAGNOSIS — E876 Hypokalemia: Secondary | ICD-10-CM

## 2020-03-10 NOTE — Assessment & Plan Note (Signed)
She does have a lot of anxiety and stress which probably is driving her tachycardia symptoms.  Is also driving her chest discomfort.  Complicating factors are that she has migraines and is dizzy spells. None of these are a primary cardiac etiology.  Her tachycardia is probably more related to anxiety as there is no significant findings on her monitor despite her noting symptoms.

## 2020-03-10 NOTE — Assessment & Plan Note (Signed)
She only really had sinus tachycardia on the monitor and didn't appear to be in times of symptoms.  Rare PAC noted. This would probably explain why her symptoms did not benefit from the addition of beta-blocker.

## 2020-03-10 NOTE — Assessment & Plan Note (Signed)
She still has these episodes of chest pain that sound more musculoskeletal in nature.  It occurs at rest and deep breathing.  Even associated with turning certain directions.  I reassured her that based on her treadmill stress test results, it is very unlikely that her symptoms are related to coronary artery disease, especially given her young age.  Again suggested use of Tylenol Motrin.

## 2020-03-10 NOTE — Assessment & Plan Note (Signed)
She doesn't really have POTS from a standpoint of tachycardia.  Regardless, the most prominent treatment option is hydration, salt liberalization also ensuring that she has enough potassium. Since he does have a swelling sure how much help she would get from compression stockings.

## 2020-03-14 ENCOUNTER — Encounter: Payer: Self-pay | Admitting: Rheumatology

## 2020-03-14 ENCOUNTER — Ambulatory Visit (INDEPENDENT_AMBULATORY_CARE_PROVIDER_SITE_OTHER): Payer: 59 | Admitting: Rheumatology

## 2020-03-14 ENCOUNTER — Other Ambulatory Visit: Payer: Self-pay

## 2020-03-14 VITALS — BP 114/70 | HR 77 | Resp 14 | Ht 66.75 in | Wt 166.0 lb

## 2020-03-14 DIAGNOSIS — G43809 Other migraine, not intractable, without status migrainosus: Secondary | ICD-10-CM | POA: Diagnosis not present

## 2020-03-14 DIAGNOSIS — F4322 Adjustment disorder with anxiety: Secondary | ICD-10-CM

## 2020-03-14 DIAGNOSIS — M25521 Pain in right elbow: Secondary | ICD-10-CM | POA: Diagnosis not present

## 2020-03-14 DIAGNOSIS — R002 Palpitations: Secondary | ICD-10-CM | POA: Diagnosis not present

## 2020-03-14 DIAGNOSIS — M25522 Pain in left elbow: Secondary | ICD-10-CM | POA: Diagnosis not present

## 2020-03-14 DIAGNOSIS — Z87898 Personal history of other specified conditions: Secondary | ICD-10-CM

## 2020-03-14 DIAGNOSIS — M2480 Other specific joint derangements of unspecified joint, not elsewhere classified: Secondary | ICD-10-CM

## 2020-03-14 DIAGNOSIS — M25561 Pain in right knee: Secondary | ICD-10-CM

## 2020-03-14 DIAGNOSIS — Z8719 Personal history of other diseases of the digestive system: Secondary | ICD-10-CM | POA: Diagnosis not present

## 2020-03-14 DIAGNOSIS — M25562 Pain in left knee: Secondary | ICD-10-CM

## 2020-03-14 DIAGNOSIS — M5481 Occipital neuralgia: Secondary | ICD-10-CM

## 2020-03-14 DIAGNOSIS — G8929 Other chronic pain: Secondary | ICD-10-CM

## 2020-03-14 NOTE — Patient Instructions (Signed)
Journal for Nurse Practitioners, 15(4), 263-267. Retrieved August 31, 2018 from [email or web address].[email or web address]">  Knee Exercises Ask your health care provider which exercises are safe for you. Do exercises exactly as told by your health care provider and adjust them as directed. It is normal to feel mild stretching, pulling, tightness, or discomfort as you do these exercises. Stop right away if you feel sudden pain or your pain gets worse. Do not begin these exercises until told by your health care provider. Stretching and range-of-motion exercises These exercises warm up your muscles and joints and improve the movement and flexibility of your knee. These exercises also help to relieve pain and swelling. Knee extension, prone 1. Lie on your abdomen (prone position) on a bed. 2. Place your left / right knee just beyond the edge of the surface so your knee is not on the bed. You can put a towel under your left / right thigh just above your kneecap for comfort. 3. Relax your leg muscles and allow gravity to straighten your knee (extension). You should feel a stretch behind your left / right knee. 4. Hold this position for __________ seconds. 5. Scoot up so your knee is supported between repetitions. Repeat __________ times. Complete this exercise __________ times a day. Knee flexion, active  1. Lie on your back with both legs straight. If this causes back discomfort, bend your left / right knee so your foot is flat on the floor. 2. Slowly slide your left / right heel back toward your buttocks. Stop when you feel a gentle stretch in the front of your knee or thigh (flexion). 3. Hold this position for __________ seconds. 4. Slowly slide your left / right heel back to the starting position. Repeat __________ times. Complete this exercise __________ times a day. Quadriceps stretch, prone  1. Lie on your abdomen on a firm surface, such as a bed or padded floor. 2. Bend your left / right knee and hold  your ankle. If you cannot reach your ankle or pant leg, loop a belt around your foot and grab the belt instead. 3. Gently pull your heel toward your buttocks. Your knee should not slide out to the side. You should feel a stretch in the front of your thigh and knee (quadriceps). 4. Hold this position for __________ seconds. Repeat __________ times. Complete this exercise __________ times a day. Hamstring, supine 1. Lie on your back (supine position). 2. Loop a belt or towel over the ball of your left / right foot. The ball of your foot is on the walking surface, right under your toes. 3. Straighten your left / right knee and slowly pull on the belt to raise your leg until you feel a gentle stretch behind your knee (hamstring). ? Do not let your knee bend while you do this. ? Keep your other leg flat on the floor. 4. Hold this position for __________ seconds. Repeat __________ times. Complete this exercise __________ times a day. Strengthening exercises These exercises build strength and endurance in your knee. Endurance is the ability to use your muscles for a long time, even after they get tired. Quadriceps, isometric This exercise stretches the muscles in front of your thigh (quadriceps) without moving your knee joint (isometric). 1. Lie on your back with your left / right leg extended and your other knee bent. Put a rolled towel or small pillow under your knee if told by your health care provider. 2. Slowly tense the muscles in the front of your left /   right thigh. You should see your kneecap slide up toward your hip or see increased dimpling just above the knee. This motion will push the back of the knee toward the floor. 3. For __________ seconds, hold the muscle as tight as you can without increasing your pain. 4. Relax the muscles slowly and completely. Repeat __________ times. Complete this exercise __________ times a day. Straight leg raises This exercise stretches the muscles in front  of your thigh (quadriceps) and the muscles that move your hips (hip flexors). 1. Lie on your back with your left / right leg extended and your other knee bent. 2. Tense the muscles in the front of your left / right thigh. You should see your kneecap slide up or see increased dimpling just above the knee. Your thigh may even shake a bit. 3. Keep these muscles tight as you raise your leg 4-6 inches (10-15 cm) off the floor. Do not let your knee bend. 4. Hold this position for __________ seconds. 5. Keep these muscles tense as you lower your leg. 6. Relax your muscles slowly and completely after each repetition. Repeat __________ times. Complete this exercise __________ times a day. Hamstring, isometric 1. Lie on your back on a firm surface. 2. Bend your left / right knee about __________ degrees. 3. Dig your left / right heel into the surface as if you are trying to pull it toward your buttocks. Tighten the muscles in the back of your thighs (hamstring) to "dig" as hard as you can without increasing any pain. 4. Hold this position for __________ seconds. 5. Release the tension gradually and allow your muscles to relax completely for __________ seconds after each repetition. Repeat __________ times. Complete this exercise __________ times a day. Hamstring curls If told by your health care provider, do this exercise while wearing ankle weights. Begin with __________ lb weights. Then increase the weight by 1 lb (0.5 kg) increments. Do not wear ankle weights that are more than __________ lb. 1. Lie on your abdomen with your legs straight. 2. Bend your left / right knee as far as you can without feeling pain. Keep your hips flat against the floor. 3. Hold this position for __________ seconds. 4. Slowly lower your leg to the starting position. Repeat __________ times. Complete this exercise __________ times a day. Squats This exercise strengthens the muscles in front of your thigh and knee  (quadriceps). 1. Stand in front of a table, with your feet and knees pointing straight ahead. You may rest your hands on the table for balance but not for support. 2. Slowly bend your knees and lower your hips like you are going to sit in a chair. ? Keep your weight over your heels, not over your toes. ? Keep your lower legs upright so they are parallel with the table legs. ? Do not let your hips go lower than your knees. ? Do not bend lower than told by your health care provider. ? If your knee pain increases, do not bend as low. 3. Hold the squat position for __________ seconds. 4. Slowly push with your legs to return to standing. Do not use your hands to pull yourself to standing. Repeat __________ times. Complete this exercise __________ times a day. Wall slides This exercise strengthens the muscles in front of your thigh and knee (quadriceps). 1. Lean your back against a smooth wall or door, and walk your feet out 18-24 inches (46-61 cm) from it. 2. Place your feet hip-width apart. 3.   Slowly slide down the wall or door until your knees bend __________ degrees. Keep your knees over your heels, not over your toes. Keep your knees in line with your hips. 4. Hold this position for __________ seconds. Repeat __________ times. Complete this exercise __________ times a day. Straight leg raises This exercise strengthens the muscles that rotate the leg at the hip and move it away from your body (hip abductors). 1. Lie on your side with your left / right leg in the top position. Lie so your head, shoulder, knee, and hip line up. You may bend your bottom knee to help you keep your balance. 2. Roll your hips slightly forward so your hips are stacked directly over each other and your left / right knee is facing forward. 3. Leading with your heel, lift your top leg 4-6 inches (10-15 cm). You should feel the muscles in your outer hip lifting. ? Do not let your foot drift forward. ? Do not let your knee  roll toward the ceiling. 4. Hold this position for __________ seconds. 5. Slowly return your leg to the starting position. 6. Let your muscles relax completely after each repetition. Repeat __________ times. Complete this exercise __________ times a day. Straight leg raises This exercise stretches the muscles that move your hips away from the front of the pelvis (hip extensors). 1. Lie on your abdomen on a firm surface. You can put a pillow under your hips if that is more comfortable. 2. Tense the muscles in your buttocks and lift your left / right leg about 4-6 inches (10-15 cm). Keep your knee straight as you lift your leg. 3. Hold this position for __________ seconds. 4. Slowly lower your leg to the starting position. 5. Let your leg relax completely after each repetition. Repeat __________ times. Complete this exercise __________ times a day. This information is not intended to replace advice given to you by your health care provider. Make sure you discuss any questions you have with your health care provider. Document Revised: 09/01/2018 Document Reviewed: 09/01/2018 Elsevier Patient Education  [place]. Elbow and Forearm Exercises Ask your health care provider which exercises are safe for you. Do exercises exactly as told by your health care provider and adjust them as directed. It is normal to feel mild stretching, pulling, tightness, or discomfort as you do these exercises. Stop right away if you feel sudden pain or your pain gets worse. Do not begin these exercises until told by your health care provider. Range-of-motion exercises These exercises warm up your muscles and joints and improve the movement and flexibility of your injured elbow and forearm. The exercises also help to relieve pain, numbness, and tingling. These exercises are done using the muscles in your injured elbow and forearm (active). Elbow flexion, active 6. Hold your left / right arm at your side, and bend  your elbow (flexion) as far as you can using only your arm muscles. 7. Hold this position for __________ seconds. 8. Slowly return to the starting position. Repeat __________ times. Complete this exercise __________ times a day. Elbow extension, active 5. Hold your left / right arm at your side, and straighten your elbow (extension) as much as you can using only your arm muscles. 6. Hold this position for __________ seconds. 7. Slowly return to the starting position. Repeat __________ times. Complete this exercise __________ times a day. Active forearm rotation, supination This is an exercise in which you turn (rotate) your forearm palm up (supination). 5. Stand  or sit with your elbows at your sides. 6. Bend your left / right elbow to a 90-degree angle (right angle). 7. Rotate your palm up until you feel a gentle stretch on the inside of your forearm. 8. Hold this position for __________ seconds. 9. Slowly return to the starting position. Repeat __________ times. Complete this exercise __________ times a day. Active forearm rotation, pronation This is an exercise in which you turn (rotate) your forearm palm down (pronation). 5. Stand or sit with your elbows at your sides. 6. Bend your left / right elbow to a 90-degree angle (right angle). 7. Rotate your palm down until you feel a gentle stretch on the top of your forearm. 8. Hold this position for __________ seconds. 9. Slowly return to the starting position. Repeat __________ times. Complete this exercise __________ times a day. Stretching exercises These exercises warm up your muscles and joints and improve the movement and flexibility of your injured elbow and forearm. These exercises also help to relieve pain, numbness, and tingling. These exercises are done using your healthy elbow and forearm to help stretch the muscles in your injured elbow and forearm (active-assisted). Elbow flexion, active-assisted  5. Hold your left / right  arm at your side, and bend your elbow (flexion) as much as you can using your left / right arm muscles. 6. Use your other hand to bend your left / right elbow farther. To do this, gently push up on your forearm until you feel a gentle stretch on the back of your elbow. 7. Hold this position for __________ seconds. 8. Slowly return to the starting position. Repeat __________ times. Complete this exercise __________ times a day. Elbow extension, active-assisted  7. Hold your left / right arm at your side, and straighten your elbow (extension) as much as you can using your left / right arm muscles. 8. Use your other hand to straighten the left / right elbow farther. To do this, gently push down on your forearm until you feel a gentle stretch on the inside of your elbow. 9. Hold this position for __________ seconds. 10. Slowly return to the starting position. Repeat __________ times. Complete this exercise __________ times a day. Active-assisted forearm rotation, supination This is an exercise in which you turn (rotate) your forearm palm up (supination). 6. Sit with your left / right elbow bent in a 90-degree angle (right angle) with your forearm resting on a table. 7. Keeping your upper body and shoulder still, rotate your forearm so your palm faces upward. 8. Use your other hand to help rotate your forearm further until you feel a gentle to moderate stretch. 9. Hold this position for __________ seconds. 10. Slowly release the stretch and return to the starting position. Repeat __________ times. Complete this exercise __________ times a day. Active-assisted forearm rotation, pronation This is an exercise in which you turn (rotate) your forearm palm down (pronation). 5. Sit with your left / right elbow bent in a 90-degree angle (right angle) with your forearm resting on a table. 6. Keeping your upper body and shoulder still, rotate your forearm so your palm faces the tabletop. 7. Use your other  hand to help rotate your forearm further until you feel a gentle to moderate stretch. 8. Hold this position for __________ seconds. 9. Slowly release the stretch and return to the starting position. Repeat __________ times. Complete this exercise __________ times a day. Passive elbow flexion, supine 5. Lie on your back (supine position). 6. Extend your left /  right arm up in the air, bracing it with your other hand. 7. Let your left / right hand slowly lower toward your shoulder (passive flexion), while your elbow stays pointed toward the ceiling. You should feel a gentle stretch along the back of your upper arm and elbow. 8. If instructed by your health care provider, you may increase the intensity of your stretch by adding a small wrist weight or hand weight. 9. Hold this position for __________ seconds. 10. Slowly return to the starting position. Repeat __________ times. Complete this exercise __________ times a day. Passive elbow extension, supine  5. Lie on your back (supine position). Make sure that you are in a comfortable position that lets you relax your arm muscles. 6. Place a folded towel under your left / right upper arm so your elbow and shoulder are at the same height. Straighten your left / right arm so your elbow does not rest on the bed or towel. 7. Let the weight of your hand stretch your elbow (passive extension). Keep your arm and chest muscles relaxed. You should feel a stretch on the inside of your elbow. 8. If told by your health care provider, you may increase the intensity of your stretch by adding a small wrist weight or hand weight. 9. Hold this position for __________ seconds. 10. Slowly release the stretch. Repeat __________ times. Complete this exercise __________ times a day. Strengthening exercises These exercises build strength and endurance in your elbow and forearm. Endurance is the ability to use your muscles for a long time, even after they get tired. Elbow  flexion, isometric  7. Stand or sit up straight. 8. Bend your left / right elbow in a 90-degree angle (right angle), and keep your forearm at the height of your waist. Your thumb should be pointed toward the ceiling (neutral forearm). 9. Place your other hand on top of your left / right forearm. Gently push down while you resist with your left / right arm (isometric flexion). Push as hard as you can with both arms without causing any pain or movement at your left / right elbow. 10. Hold this position for __________ seconds. 11. Slowly release the tension in both arms. Let your muscles relax completely before you repeat the exercise. Repeat __________ times. Complete this exercise __________ times a day. Elbow extension, isometric  6. Stand or sit up straight. 7. Place your left / right arm so your palm faces your abdomen and is at the height of your waist. 8. Place your other hand on the underside of your left / right forearm. Gently push up while you resist with your left / right arm (isometric extension). Push as hard as you can with both arms without causing any pain or movement at your left / right elbow. 9. Hold this position for __________ seconds. 10. Slowly release the tension in both arms. Let your muscles relax completely before you repeat the exercise. Repeat __________ times. Complete this exercise __________ times a day. Elbow flexion with forearm palm up  1. Sit on a firm chair without armrests, or stand up. 2. Place your left / right arm at your side with your elbow straight and your palm facing forward. 3. Holding a __________weight or gripping a rubber exercise band or tubing, bend your elbow to bring your hand toward your shoulder (flexion). 4. Hold this position for __________ seconds. 5. Slowly return to the starting position. Repeat __________ times. Complete this exercise __________ times a day. Elbow extension,  active  1. Sit on a firm chair without armrests, or stand  up. 2. Hold a rubber exercise band or tubing in both hands. 3. Keeping your upper arms at your sides, bring both hands up to your left / right shoulder. Keep your left / right hand just below your other hand. 4. Straighten your left / right elbow (extension) while keeping your other arm still. 5. Hold this position for __________ seconds. 6. Control the resistance of the band or tubing as you return to the starting position. Repeat __________ times. Complete this exercise __________ times a day. Forearm rotation, supination  1. Sit with your left / right forearm supported on a table. Your elbow should be at waist height and bent at a 90-degree angle (right angle). 2. Gently grasp a lightweight hammer. 3. Rest your hand over the edge of the table with your palm facing down. 4. Without moving your left / right elbow, slowly rotate your forearm to turn your palm up toward the ceiling (supination). 5. Hold this position for __________ seconds. 6. Slowly return to the starting position. Repeat __________ times. Complete this exercise __________ times a day. Forearm rotation, pronation  1. Sit with your left / right forearm supported on a table. Keep your elbow below shoulder height. 2. Gently grasp a lightweight hammer. 3. Rest your hand over the edge of the table with your palm facing up. 4. Without moving your left / right elbow, slowly rotate your forearm to turn your palm down toward the floor (pronation). 5. Hold this position for __________seconds. 6. Slowly return to the starting position. Repeat __________ times. Complete this exercise __________ times a day. This information is not intended to replace advice given to you by your health care provider. Make sure you discuss any questions you have with your health care provider. Document Revised: 03/04/2019 Document Reviewed: 12/02/2018 Elsevier Patient Education  [place].

## 2020-03-15 ENCOUNTER — Other Ambulatory Visit (INDEPENDENT_AMBULATORY_CARE_PROVIDER_SITE_OTHER): Payer: 59

## 2020-03-15 ENCOUNTER — Other Ambulatory Visit: Payer: Self-pay

## 2020-03-15 ENCOUNTER — Ambulatory Visit (INDEPENDENT_AMBULATORY_CARE_PROVIDER_SITE_OTHER): Payer: 59 | Admitting: Psychiatry

## 2020-03-15 DIAGNOSIS — E876 Hypokalemia: Secondary | ICD-10-CM | POA: Diagnosis not present

## 2020-03-15 DIAGNOSIS — F411 Generalized anxiety disorder: Secondary | ICD-10-CM | POA: Diagnosis not present

## 2020-03-15 NOTE — Progress Notes (Signed)
[facility] Counselor/Therapist Progress Note  Patient ID: Melanie Jordan, MRN: [id number],    Date: 03/15/2020  Time Spent: 60 minutes  10:00am to 11:00am  Treatment Type: Individual Therapy  Reported Symptoms: anxiety, depression has decreased some, "physical anxiety symptoms at times of racing heart--reports has been checked out by cardiologist and is ok at this time."  Mental Status Exam:  Appearance:   Casual     Behavior:  Appropriate and Sharing  Motor:  Normal  Speech/Language:   Normal Rate  Affect:  Congruent and anxious, some depression  Mood:  anxious and depressed  Thought process:  goal directed  Thought content:    reports being distracted easily  Sensory/Perceptual disturbances:    WNL  Orientation:  oriented to person, place, time/date, situation, day of week, month of year and year  Attention:  Good/Fair and affected by my stress level  Concentration:  Good/Fair   Memory:  sometimes forget what I was going to say  [employer] of knowledge:   Good  Insight:    Good/Fair  Judgment:   Good  Impulse Control:  Good/Fair   Risk Assessment: Danger to Self:  No Self-injurious Behavior: No Danger to Others: No Duty to Warn:no Physical Aggression / Violence:No  Access to Firearms a concern: No  Gang Involvement:No   Subjective: Patient in today reporting feeling upset when PT felt I needed to stop PT as I felt like we were giving up on my getting better.  But PT explained that we weren't giving up but rather taking a break and will resume later.   Interventions: Cognitive Behavioral Therapy and Ego-Supportive  Diagnosis:   ICD-10-CM   1. Generalized anxiety disorder  F41.1     Plan of Care: Patient not signing Tx Plan on computer screen due to COVID-19.  Treatment Goals: Goals will remain on tx plan as patient works on strategies to achieve her goals. Progress will be noted each session in "Progress" section of the Plan.  Long term goal: Reduce  overall level, frequency, and intensity of the anxiety so that daily functioning is not impaired.  Short term goal: Verbalize an understanding of the role that fearful thinking plays in creating fears, excessive worry, and persistent anxiety symptoms.  Strategy: Identify, challenge, and replace fearful self-talk with positive, realistic, and empowering self-talk.  Progress: Patient still working to reduce her anxiety.  Physical symptoms have decreased some,  Worrisome thoughts have decreased some. Reassurance from health care providers has helped her worrisome thoughts to decrease. Is concerned that certain meds may not help her. Caffeine affects her migraines and she has had more caffeine recently and had another migraine. Feels that she has a migraine coming on and has meds to take.  Not as fearful and not as intermittently hopeless today.Feels that her meds are helping. Discussed some issues today related to family including her grandmother.  Also looked at patient's habit of "assuming the worst" and helped her work on that behavior and commit to decreasing her assumnig the worst.  Working to reduce her overall level, frequency, and intensity of her anxiety so that daily functioning is not impaired.  Progress noted as patient currently rates herself as  "3" on 1-10 anxiety scale and she was a "4-5 last visit."  Goal review and progress noted with patient.  Next appt within 2-3 weeks.   Shanon Ace, LCSW

## 2020-03-16 ENCOUNTER — Encounter: Payer: Self-pay | Admitting: Physical Therapy

## 2020-03-16 ENCOUNTER — Other Ambulatory Visit: Payer: Self-pay

## 2020-03-16 ENCOUNTER — Ambulatory Visit: Payer: 59 | Admitting: Physical Therapy

## 2020-03-16 DIAGNOSIS — R293 Abnormal posture: Secondary | ICD-10-CM | POA: Diagnosis not present

## 2020-03-16 DIAGNOSIS — R42 Dizziness and giddiness: Secondary | ICD-10-CM | POA: Diagnosis not present

## 2020-03-16 DIAGNOSIS — G44221 Chronic tension-type headache, intractable: Secondary | ICD-10-CM

## 2020-03-16 DIAGNOSIS — R2681 Unsteadiness on feet: Secondary | ICD-10-CM | POA: Diagnosis not present

## 2020-03-16 DIAGNOSIS — M542 Cervicalgia: Secondary | ICD-10-CM | POA: Diagnosis not present

## 2020-03-16 LAB — POTASSIUM: Potassium: 3.7 mEq/L (ref 3.5–5.1)

## 2020-03-16 NOTE — Therapy (Signed)
[facility] [address] [place], [place], [address] Phone: [phone]   Fax:  [phone]  Physical Therapy Treatment  Patient Details  Name: Melanie Jordan MRN: [id number] Date of Birth: 04-Jun-1991 Referring Provider (PT): Ray Church, MD   Encounter Date: 03/16/2020  PT End of Session - 03/16/20 1025    Visit Number  6    Number of Visits  13    Date for PT Re-Evaluation  05/02/20    Authorization Type  [employer] - [employer]    PT Start Time  [phone]    PT Stop Time  [phone]    PT Time Calculation (min)  50 min    Activity Tolerance  No increased pain;Patient tolerated treatment well    Behavior During Therapy  [facility] for tasks assessed/performed       Past Medical History:  Diagnosis Date  . ADHD    now off Adderal   . Allergy   . Anxiety   . Chronic head pain    taking Metoprolol  . GERD (gastroesophageal reflux disease)   . IBS (irritable bowel syndrome)   . Migraine     Past Surgical History:  Procedure Laterality Date  . ESOPHAGEAL MANOMETRY N/A 10/13/2019   Procedure: ESOPHAGEAL MANOMETRY (EM);  Surgeon: Mauri Pole, MD;  Location: [facility];  Service: Endoscopy;  Laterality: N/A;    There were no vitals filed for this visit.  Subjective Assessment - 03/16/20 0853    Subjective  Pt was sore after dry needling but then it felt better; felt like it helped with the dizziness and tension in neck.  Started drinking coffee again so headaches are still present but did start injection for headaches.  Does the injections once a month.  Was in an elevator and it went up quickly and then felt woozy afterwards.  Went to see a physician about hypermobility - referred her to PT and then to sports medicine.  Not sure she is going to do PT.    Pertinent History  migraines, ADHD, generalized anxiety disorder, IBS, B12 deficiency    Diagnostic tests  cervical MRI, LUE nerve conduction study  - all WFL    Patient Stated Goals  decrease HA and dizziness    Currently in Pain?  Yes         [facility] PT Assessment - 03/16/20 0900      ROM / Strength   AROM / PROM / Strength  AROM      AROM   Overall AROM   Deficits    AROM Assessment Site  Cervical    Cervical Flexion  75   pain/tension   Cervical Extension  60    Cervical - Right Side Bend  40    Cervical - Left Side Bend  45    Cervical - Right Rotation  65    Cervical - Left Rotation  68      Functional Gait  Assessment   Gait assessed   Yes    Gait Level Surface  Walks 20 ft in less than 7 sec but greater than 5.5 sec, uses assistive device, slower speed, mild gait deviations, or deviates 6-10 in outside of the 12 in walkway width.    Change in Gait Speed  Able to smoothly change walking speed without loss of balance or gait deviation. Deviate no more than 6 in outside of the 12 in walkway width.    Gait with Horizontal Head Turns  Performs head turns smoothly with slight change in gait velocity (eg, minor disruption to smooth gait path), deviates 6-10 in outside 12 in walkway width, or uses an assistive device.    Gait with Vertical Head Turns  Performs task with slight change in gait velocity (eg, minor disruption to smooth gait path), deviates 6 - 10 in outside 12 in walkway width or uses assistive device    Gait and Pivot Turn  Pivot turns safely within 3 sec and stops quickly with no loss of balance.    Step Over Obstacle  Is able to step over 2 stacked shoe boxes taped together (9 in total height) without changing gait speed. No evidence of imbalance.    Gait with Narrow Base of Support  Is able to ambulate for 10 steps heel to toe with no staggering.    Gait with Eyes Closed  Walks 20 ft, uses assistive device, slower speed, mild gait deviations, deviates 6-10 in outside 12 in walkway width. Ambulates 20 ft in less than 9 sec but greater than 7 sec.    Ambulating Backwards  Walks 20 ft, no assistive devices, good  speed, no evidence for imbalance, normal gait    Steps  Alternating feet, no rail.    Total Score  26    FGA comment:  26/30                   [facility] Adult PT Treatment/Exercise - 03/16/20 1818      Neck Exercises: Supine   Neck Retraction  10 reps;5 secs    Neck Retraction Limitations  cues for equal activation between L and R; still under activating L side    Capital Flexion  10 reps    Capital Flexion Limitations  2 sets while maintaining 45 deg cervical rotation to L and R    Cervical Rotation  Right;Left;Other reps (comment)    Cervical Rotation Limitations  rotation 45 deg to L and R while performing capital flexion/extension; performed 2 sets on each side      Shoulder Exercises: Stretch   Wall Stretch - Flexion  2 reps;30 seconds    Wall Stretch - Flexion Limitations  single arm pectoralis stretch - cues to place elbow in slight flexion and support elbow on wall to avoid hyperextension             PT Education - 03/16/20 1832    Education Details  progress towards goals, adjusted HEP    Person(s) Educated  Patient    Methods  Explanation;Demonstration;Handout    Comprehension  Verbalized understanding;Returned demonstration       PT Short Term Goals - 03/16/20 1824      PT SHORT TERM GOAL #1   Title  Pt will be independent with initial HEP    Baseline  have been adjusting HEP as needed    Time  6    Period  Weeks    Status  Partially Met    Target Date  03/18/20      PT SHORT TERM GOAL #2   Title  Pt will participate in assessment of cervical ROM and muscle tension with LTG to be set    Time  6    Period  Weeks    Status  Achieved    Target Date  03/18/20      PT SHORT TERM GOAL #3   Title  Pt will demonstrate decreased falls risk during gait as indicated by 2 point improvement in FGA  Baseline  26/30 - score remained the same    Time  6    Period  Weeks    Status  Not Met    Target Date  03/18/20      PT SHORT TERM GOAL #4   Title   Pt will report decrease in symptoms of dizziness when performing head movements in various directions and when performing quick body turns    Baseline  decreased dizziness, does feel off balance with head and body turns    Time  6    Period  Weeks    Status  Partially Met    Target Date  03/18/20        PT Long Term Goals - 02/08/20 1705      PT LONG TERM GOAL #1   Title  Pt will demonstrate independence with final vestibular, balance and aerobic HEP  (LTG due by 05/02/2020)    Time  12    Period  Weeks    Status  New      PT LONG TERM GOAL #2   Title  Pt will demonstrate no falls risk during gait as indicated by score of 30/30 on FGA    Time  12    Period  Weeks    Status  New      PT LONG TERM GOAL #3   Title  Pt will report 50% reduction in swaying sensation when taking a shower, when at work or when sitting still in her car    Time  12    Period  Weeks    Status  New      PT LONG TERM GOAL #4   Title  Pt will demonstrate 10 deg increase in cervical rotation and side bending and report </= 3/10 HA and neck pain on average each week    Baseline  sidebending: 30 deg bilaterally, rotation 60 deg bilaterally  - pain with L sidebending and L rotation    Time  12    Period  Weeks    Status  Revised      PT LONG TERM GOAL #5   Title  Pt will increase FOTO to >/= 79% and DHI will decrease by 18 points overall    Baseline  65%; [address] [place]    Time  12    Period  Weeks    Status  New            Plan - 03/16/20 1827    Clinical Impression Statement  Pt demonstrates improvement in dizziness; headaches unchanged but just started new medication.  Performed assessment of STG; pt is make slow progress due to poor control of headaches and poor tolerance to vestibular exercises.  Pt does start aquatic therapy on Monday.  Pt responded well to dry needling and to gentle stretches and neck strengthening.  FGA score remained the same due to slowed gait velocity and imbalance with head turns  and eyes closed.  Will continue to address in order to progress towards LTG.    Personal Factors and Comorbidities  Comorbidity 3+;Behavior Pattern;Past/Current Experience;Profession    Comorbidities  migraines, ADHD, generalized anxiety disorder, IBS, B12 deficiency, not currently taking medication to manage migraines - previous medication had too strong side effects    Examination-Activity Limitations  Locomotion Level;Stand;Sit    Examination-Participation Restrictions  Cleaning;Community Activity;Driving;Shop    Stability/Clinical Decision Making  Evolving/Moderate complexity    Rehab Potential  Fair    PT Frequency  1x / week  PT Duration  12 weeks    PT Treatment/Interventions  ADLs/Self Care Home Management;Aquatic Therapy;Canalith Repostioning;Cryotherapy;Moist Heat;Gait training;Stair training;Functional mobility training;Therapeutic activities;Therapeutic exercise;Balance training;Neuromuscular re-education;Patient/family education;Manual techniques;Passive range of motion;Dry needling;Vestibular    PT Next Visit Plan  Aquatic therapy for stress relief, posture and HEP.  Progress land HEP as pt is able to tolerate - thoracic spine mobilization, balance - head turns, vision removed, trampoline or physioball?  incorporate aerobic exercise on treadmill.  Add visual and vestibular exercises as pt is better able to tolerate    Consulted and Agree with Plan of Care  Patient       Patient will benefit from skilled therapeutic intervention in order to improve the following deficits and impairments:  Dizziness, Pain, Postural dysfunction, Decreased range of motion, Decreased balance, Difficulty walking  Visit Diagnosis: Dizziness and giddiness  Cervicalgia  Unsteadiness on feet  Chronic tension-type headache, intractable  Abnormal posture     Problem List Patient Active Problem List   Diagnosis Date Noted  . Dysphagia   . Anterior chest wall pain 08/12/2019  . Dizziness on  standing 05/24/2019  . Rapid palpitations 05/24/2019  . Adjustment disorder with anxious mood 04/02/2019  . Initiation of oral contraception 09/21/2018    Rico Junker, PT, DPT 03/16/20    6:36 PM    [facility] [address] [place], [place], [address] Phone: [phone]   Fax:  [phone]  Name: Melanie Jordan MRN: [id number] Date of Birth: [birth year]

## 2020-03-16 NOTE — Patient Instructions (Addendum)
Access Code: [email or web address] URL: [email or web address] Date: 03/16/2020 Prepared by: Misty Stanley  Exercises Seated Cervical Sidebending Stretch - 1 x daily - 7 x weekly - 2 sets - 30 second hold Standing Pec Stretch at [place] - 1 x daily - 7 x weekly - 3 sets - 20 second hold Supine Cervical Retraction with Towel - 1 x daily - 7 x weekly - 2 sets - 10 reps Beginner Head Nod - 1 x daily - 7 x weekly - 2 sets - 8 reps Bouncing on the Bed - 1 x daily - 7 x weekly - 3 sets - 20 second hold

## 2020-03-17 ENCOUNTER — Encounter: Payer: 59 | Admitting: Physical Therapy

## 2020-03-20 ENCOUNTER — Other Ambulatory Visit: Payer: Self-pay

## 2020-03-20 ENCOUNTER — Encounter: Payer: Self-pay | Admitting: Physical Therapy

## 2020-03-20 ENCOUNTER — Ambulatory Visit: Payer: 59 | Admitting: Physical Therapy

## 2020-03-20 DIAGNOSIS — G44221 Chronic tension-type headache, intractable: Secondary | ICD-10-CM | POA: Diagnosis not present

## 2020-03-20 DIAGNOSIS — M542 Cervicalgia: Secondary | ICD-10-CM | POA: Diagnosis not present

## 2020-03-20 DIAGNOSIS — R2681 Unsteadiness on feet: Secondary | ICD-10-CM | POA: Diagnosis not present

## 2020-03-20 DIAGNOSIS — R42 Dizziness and giddiness: Secondary | ICD-10-CM

## 2020-03-20 DIAGNOSIS — R293 Abnormal posture: Secondary | ICD-10-CM

## 2020-03-20 NOTE — Therapy (Signed)
[facility] [address] [place], [place], [address] Phone: [phone]   Fax:  [phone]  Physical Therapy Treatment  Patient Details  Name: Melanie Jordan MRN: [id number] Date of Birth: 1991-10-02 Referring Provider (PT): Ray Church, MD   Encounter Date: 03/20/2020  PT End of Session - 03/20/20 1823    Visit Number  7    Number of Visits  13    Date for PT Re-Evaluation  05/02/20    Authorization Type  [employer] - [employer]    PT Start Time  1330    PT Stop Time  1415    PT Time Calculation (min)  45 min    Activity Tolerance  No increased pain;Patient tolerated treatment well    Behavior During Therapy  [facility] for tasks assessed/performed       Past Medical History:  Diagnosis Date  . ADHD    now off Adderal   . Allergy   . Anxiety   . Chronic head pain    taking Metoprolol  . GERD (gastroesophageal reflux disease)   . IBS (irritable bowel syndrome)   . Migraine     Past Surgical History:  Procedure Laterality Date  . ESOPHAGEAL MANOMETRY N/A 10/13/2019   Procedure: ESOPHAGEAL MANOMETRY (EM);  Surgeon: Mauri Pole, MD;  Location: [facility];  Service: Endoscopy;  Laterality: N/A;    There were no vitals filed for this visit.  Subjective Assessment - 03/20/20 1821    Subjective  Pt continues to report "swaying" feeling and headaches but feels like injection helped.    Pertinent History  migraines, ADHD, generalized anxiety disorder, IBS, B12 deficiency    Diagnostic tests  cervical MRI, LUE nerve conduction study - all WFL    Patient Stated Goals  decrease HA and dizziness    Currently in Pain?  No/denies   none at present-just pressure      Aquatic therapy at [facility] - pool temp 86.7degrees  Patient seen for aquatic therapy today. Treatment took place in water 3.5-4 feet deep depending upon activity. Pt entered and exited the pool via ramp and supervisions.    Runners stretch bil LE's and toes/feet up edge of pool x 30 seconds each bil LE'sboth at beginning and end of session.  Pt performed gait training in pool forwards 38mx 2 repswithout UE support,257m 2 working on increased armswing,2065m2 marchingthen 58m18m repsbackwards. Performed side stepping squatsx 58m 58mwith UE shoulder abd/add.Cues for technique/sequence.  Jogging 67m x75mith rest break in between.  SLS 10 sec x 2 reps each side, Tandem stance x 10 sec each direction x 2 reps.  Tandem gait 68m x 54mBraiding 68m x 230ms.  Standing with intermittentUE support for LE marching, hip extension, hip abd, hamstring curl, toe raises, heel raises and hip flexion into hip extension . Pt able to perform 15 reps alternating LE's each side.   Squats x 15repswithout UE assistthen single leg x 15 reps each.  Ai Chi postures of enclosing, soothing and freeing x 15 reps each.   Pt requires buoyancy for support with balance and viscosity for resistance for strengthening exercises; buoyancy is also needed for off loading body to assist with exercises.   PT Short Term Goals - 03/16/20 1824      PT SHORT TERM GOAL #1   Title  Pt will be independent with initial HEP    Baseline  have  been adjusting HEP as needed    Time  6    Period  Weeks    Status  Partially Met    Target Date  03/18/20      PT SHORT TERM GOAL #2   Title  Pt will participate in assessment of cervical ROM and muscle tension with LTG to be set    Time  6    Period  Weeks    Status  Achieved    Target Date  03/18/20      PT SHORT TERM GOAL #3   Title  Pt will demonstrate decreased falls risk during gait as indicated by 2 point improvement in FGA    Baseline  26/30 - score remained the same    Time  6    Period  Weeks    Status  Not Met    Target Date  03/18/20      PT SHORT TERM GOAL #4   Title  Pt will report decrease in symptoms of dizziness when performing head movements in  various directions and when performing quick body turns    Baseline  decreased dizziness, does feel off balance with head and body turns    Time  6    Period  Weeks    Status  Partially Met    Target Date  03/18/20        PT Long Term Goals - 02/08/20 1705      PT LONG TERM GOAL #1   Title  Pt will demonstrate independence with final vestibular, balance and aerobic HEP  (LTG due by 05/02/2020)    Time  12    Period  Weeks    Status  New      PT LONG TERM GOAL #2   Title  Pt will demonstrate no falls risk during gait as indicated by score of 30/30 on FGA    Time  12    Period  Weeks    Status  New      PT LONG TERM GOAL #3   Title  Pt will report 50% reduction in swaying sensation when taking a shower, when at work or when sitting still in her car    Time  12    Period  Weeks    Status  New      PT LONG TERM GOAL #4   Title  Pt will demonstrate 10 deg increase in cervical rotation and side bending and report </= 3/10 HA and neck pain on average each week    Baseline  sidebending: 30 deg bilaterally, rotation 60 deg bilaterally  - pain with L sidebending and L rotation    Time  12    Period  Weeks    Status  Revised      PT LONG TERM GOAL #5   Title  Pt will increase FOTO to >/= 79% and DHI will decrease by 18 points overall    Baseline  65%; [address] [place]    Time  12    Period  Weeks    Status  New            Plan - 03/20/20 1823    Clinical Impression Statement  Pt tolerated first aquatic session well and states she does not notice the "swaying" feeling as much while in the water.  No increase in headache/pain/pressure with session.  Feels challenged by balance activities in pool.  Cont PT per POC.    Personal Factors and Comorbidities  Comorbidity  3+;Behavior Pattern;Past/Current Experience;Profession    Comorbidities  migraines, ADHD, generalized anxiety disorder, IBS, B12 deficiency, not currently taking medication to manage migraines - previous medication had too  strong side effects    Examination-Activity Limitations  Locomotion Level;Stand;Sit    Examination-Participation Restrictions  Cleaning;Community Activity;Driving;Shop    Stability/Clinical Decision Making  Evolving/Moderate complexity    Rehab Potential  Fair    PT Frequency  1x / week    PT Duration  12 weeks    PT Treatment/Interventions  ADLs/Self Care Home Management;Aquatic Therapy;Canalith Repostioning;Cryotherapy;Moist Heat;Gait training;Stair training;Functional mobility training;Therapeutic activities;Therapeutic exercise;Balance training;Neuromuscular re-education;Patient/family education;Manual techniques;Passive range of motion;Dry needling;Vestibular    PT Next Visit Plan  Aquatic therapy for stress relief, posture and HEP.  Progress land HEP as pt is able to tolerate - thoracic spine mobilization, balance - head turns, vision removed, trampoline or physioball?  incorporate aerobic exercise on treadmill.  Add visual and vestibular exercises as pt is better able to tolerate    Consulted and Agree with Plan of Care  Patient       Patient will benefit from skilled therapeutic intervention in order to improve the following deficits and impairments:  Dizziness, Pain, Postural dysfunction, Decreased range of motion, Decreased balance, Difficulty walking  Visit Diagnosis: Dizziness and giddiness  Cervicalgia  Unsteadiness on feet  Chronic tension-type headache, intractable  Abnormal posture     Problem List Patient Active Problem List   Diagnosis Date Noted  . Dysphagia   . Anterior chest wall pain 08/12/2019  . Dizziness on standing 05/24/2019  . Rapid palpitations 05/24/2019  . Adjustment disorder with anxious mood 04/02/2019  . Initiation of oral contraception 09/21/2018    Narda Bonds, PTA [facility] 03/20/20 6:33 PM Phone: [phone] Fax: [facility] [address] [place], [place], [address] Phone: [phone]   Fax:  [phone]  Name: Melanie Jordan MRN: [id number] Date of Birth: 03-30-1991

## 2020-03-21 ENCOUNTER — Other Ambulatory Visit: Payer: Self-pay

## 2020-03-21 DIAGNOSIS — Z79899 Other long term (current) drug therapy: Secondary | ICD-10-CM | POA: Diagnosis not present

## 2020-03-21 DIAGNOSIS — F902 Attention-deficit hyperactivity disorder, combined type: Secondary | ICD-10-CM | POA: Diagnosis not present

## 2020-03-21 DIAGNOSIS — F4010 Social phobia, unspecified: Secondary | ICD-10-CM | POA: Diagnosis not present

## 2020-03-22 ENCOUNTER — Encounter: Payer: Self-pay | Admitting: Family Medicine

## 2020-03-22 ENCOUNTER — Ambulatory Visit: Payer: 59 | Admitting: Family Medicine

## 2020-03-22 VITALS — BP 100/80 | HR 102 | Temp 98.6°F | Ht 66.75 in | Wt 165.2 lb

## 2020-03-22 DIAGNOSIS — H6593 Unspecified nonsuppurative otitis media, bilateral: Secondary | ICD-10-CM | POA: Diagnosis not present

## 2020-03-22 DIAGNOSIS — H6983 Other specified disorders of Eustachian tube, bilateral: Secondary | ICD-10-CM

## 2020-03-22 DIAGNOSIS — D229 Melanocytic nevi, unspecified: Secondary | ICD-10-CM | POA: Diagnosis not present

## 2020-03-22 MED FILL — ADDERALL XR 30 MG CAP SA: 30 | 30 days supply | Qty: 30 | Fill #0

## 2020-03-22 NOTE — Progress Notes (Signed)
Melanie Jordan is a 29 y.o. female  Chief Complaint  Patient presents with  . Otalgia    Pt c/o ear pressure on both ears x 3weeks.  Pt has a heard time hearing for about 1 week.    HPI: Melanie Jordan is a 29 y.o. female who complains of B/L ear pressure x 3 weeks. No ear pain. No ear drainage. Mild decreased hearing. No tinnitus. No new or worse dizziness or lightheadedness. No vision issues. No new or worse headaches. No fever, chills.  + stuffy nose - more so at night. She takes flonase at night. + sneezing more. + PND, throat clearing.   Pt also with "spot" on back of the Rt lower leg x 10 years. No change. Does not bother her. She would like it removed d/t not liking how it looks. She also notes a new mole on her Rt anterior chest. She uses tanning bed, fair skinned. Does not have derm.  Past Medical History:  Diagnosis Date  . ADHD    now off Adderal   . Allergy   . Anxiety   . Chronic head pain    taking Metoprolol  . GERD (gastroesophageal reflux disease)   . IBS (irritable bowel syndrome)   . Migraine     Past Surgical History:  Procedure Laterality Date  . ESOPHAGEAL MANOMETRY N/A 10/13/2019   Procedure: ESOPHAGEAL MANOMETRY (EM);  Surgeon: Mauri Pole, MD;  Location: [facility];  Service: Endoscopy;  Laterality: N/A;    Social History   Socioeconomic History  . Marital status: Single    Spouse name: Not on file  . Number of children: 0  . Years of education: Not on file  . Highest education level: Associate degree: occupational, Hotel manager, or vocational program  Occupational History  . 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 Smoking status: Never Smoker  . Smokeless tobacco: Never Used  Substance and Sexual Activity  . Alcohol use: Yes    Comment: social  . Drug use: Never  . Sexual activity: Yes    Birth control/protection: Pill  Other Topics Concern  . Not on file  Social History  Narrative   Engaged.      Started drinking more during the day - up to ~2 L (since onset of dizziness).      NO routine exercise.      Works in [employer] @ [place] Strain:   . Difficulty of Paying Living Expenses:   Food Insecurity:   . Worried About Charity fundraiser in the Last Year:   . Arboriculturist in the Last Year:   Transportation Needs:   . Film/video editor (Medical):   Marland Kitchen Lack of Transportation (Non-Medical):   Physical Activity:   . Days of Exercise per Week:   . Minutes of Exercise per Session:   Stress:   . Feeling of Stress :   Social Connections:   . Frequency of Communication with Friends and Family:   . Frequency of Social Gatherings with Friends and Family:   . Attends Religious Services:   . Active Member of Clubs or Organizations:   . Attends Archivist Meetings:   Marland Kitchen Marital Status:   Intimate Partner Violence:   . Fear of Current or Ex-Partner:   . Emotionally Abused:   Marland Kitchen Physically Abused:   .  Sexually Abused:     Family History  Problem Relation Age of Onset  . Cancer Mother 30       cervical and stomach cancer  . Fibroids Mother        uterine fibroid  . Thyroid disease Mother        hypothyroidism due to [place]  . Osteopenia Mother   . GER disease Mother   . Drug abuse Mother   . Stomach cancer Mother   . Anxiety disorder Mother   . Asthma Brother   . Anxiety disorder Brother   . Irritable bowel syndrome Brother   . Arthritis Maternal Grandmother   . Depression Maternal Grandmother   . Heart disease Maternal Grandmother 63       CAD with stents  . Hyperlipidemia Maternal Grandmother   . Hypertension Maternal Grandmother   . Stroke Maternal Grandmother 72       TIA  . Cancer Paternal Grandmother 53       uterine cancer  . Asthma Brother   . Anxiety disorder Sister   . Depression Father   . ADD / ADHD Maternal Uncle   . Colon cancer Neg Hx   .  Esophageal cancer Neg Hx   . Rectal cancer Neg Hx      Immunization History  Administered Date(s) Administered  . Hpv 05/26/2015  . Influenza,inj,Quad PF,6+ Mos 08/06/2018  . Influenza-Unspecified 08/15/2019  . Tdap 05/26/2015    Outpatient Encounter Medications as of 03/22/2020  Medication Sig  . Acetaminophen (TYLENOL PO) Take by mouth as needed.  . ADDERALL XR 30 MG 24 hr capsule Take 30 mg by mouth every morning.  Marland Kitchen ALPRAZolam (XANAX) 0.25 MG tablet Take 1 tablet (0.25 mg total) by mouth daily as needed for anxiety.  . Cetirizine HCl (ZYRTEC PO) Take by mouth daily as needed.   . Cholecalciferol (VITAMIN D3) 25 MCG (1000 UT) CAPS Take 1 capsule by mouth daily.  . cyclobenzaprine (FLEXERIL) 5 MG tablet Take 5 mg by mouth 3 (three) times daily.  Marland Kitchen FAMOTIDINE PO Take by mouth daily as needed.   . Galcanezumab-gnlm 120 MG/ML SOAJ Inject into the skin.  . hyoscyamine (LEVSIN SL) 0.125 MG SL tablet Place 1 tablet (0.125 mg total) under the tongue every 4 (four) hours as needed.  Marland Kitchen ibuprofen (ADVIL) 200 MG tablet Take 200 mg by mouth every 6 (six) hours as needed.  Lenda Kelp FE 1/20 1-20 MG-MCG tablet TAKE 1 TABLET BY MOUTH EVERY DAY  . Multiple Vitamins-Minerals (MULTIVITAMIN ADULTS PO) Take by mouth daily.  . promethazine (PHENERGAN) 25 MG tablet TAKE 1 TABLET (25 MG TOTAL) BY MOUTH EVERY 8 (EIGHT) HOURS AS NEEDED FOR NAUSEA FOR UP TO 90 DAYS  . vitamin B-12 (CYANOCOBALAMIN) 1000 MCG tablet Take 1,000 mcg by mouth daily.  . [DISCONTINUED] Fremanezumab-vfrm 225 MG/1.5ML SOAJ Inject into the skin.   No facility-administered encounter medications on file as of 03/22/2020.     ROS: Pertinent positives and negatives noted in HPI. Remainder of ROS non-contributory   No Known Allergies  BP 100/80 (BP Location: Left Arm, Patient Position: Sitting, Cuff Size: Normal)   Pulse (!) 102   Temp 98.6 F (37 C) (Temporal)   Ht 5' 6.75" (1.695 m)   Wt 165 lb 3.2 oz (74.9 kg)   SpO2 99%   BMI  26.07 kg/m   Physical Exam  Constitutional: She appears well-developed and well-nourished. No distress.  HENT:  Right Ear: External ear and ear canal normal. No  drainage, swelling or tenderness. Tympanic membrane is not perforated and not erythematous. A middle ear effusion is present.  Left Ear: External ear and ear canal normal. No drainage, swelling or tenderness. Tympanic membrane is not perforated and not erythematous. A middle ear effusion is present.  Serous effusion B/L Lt > Rt     A/P:  1. Nevus - Ambulatory referral to Dermatology  2. Middle ear effusion, bilateral 3. Eustachian tube dysfunction, bilateral - increase flonase to BID x 1-2 wks - restart claritin or zyrtec daily - can try 2-3 days of sudafed - if no/minimal improvement, will send Rx for prednisone 40mg  daily x 5 days Discussed plan and reviewed medications with patient, including risks, benefits, and potential side effects. Pt expressed understand. All questions answered.    This visit occurred during the SARS-CoV-2 public health emergency.  Safety protocols were in place, including screening questions prior to the visit, additional usage of staff PPE, and extensive cleaning of exam room while observing appropriate contact time as indicated for disinfecting solutions.

## 2020-03-22 NOTE — Patient Instructions (Signed)
Nasal saline spray 3x/day Flonase - increase to 2x/day x 2 wks Restart claritin

## 2020-03-24 ENCOUNTER — Encounter: Payer: 59 | Admitting: Physical Therapy

## 2020-03-25 HISTORY — PX: TRANSTHORACIC ECHOCARDIOGRAM: SHX275

## 2020-03-28 ENCOUNTER — Encounter: Payer: Self-pay | Admitting: Family Medicine

## 2020-03-28 ENCOUNTER — Ambulatory Visit (INDEPENDENT_AMBULATORY_CARE_PROVIDER_SITE_OTHER): Payer: 59 | Admitting: Family Medicine

## 2020-03-28 ENCOUNTER — Other Ambulatory Visit: Payer: Self-pay

## 2020-03-28 VITALS — BP 120/78 | Ht 67.0 in | Wt 164.0 lb

## 2020-03-28 DIAGNOSIS — M357 Hypermobility syndrome: Secondary | ICD-10-CM

## 2020-03-28 DIAGNOSIS — H6593 Unspecified nonsuppurative otitis media, bilateral: Secondary | ICD-10-CM

## 2020-03-28 DIAGNOSIS — H6983 Other specified disorders of Eustachian tube, bilateral: Secondary | ICD-10-CM

## 2020-03-28 DIAGNOSIS — M542 Cervicalgia: Secondary | ICD-10-CM | POA: Insufficient documentation

## 2020-03-28 DIAGNOSIS — H6993 Unspecified Eustachian tube disorder, bilateral: Secondary | ICD-10-CM

## 2020-03-28 MED ORDER — PENNSAID 2 % EX SOLN: 1.0000 "application " | Freq: Two times a day (BID) | CUTANEOUS | 3 refills | Status: DC

## 2020-03-28 NOTE — Progress Notes (Signed)
Melanie Jordan - 29 y.o. female MRN [id number]  Date of birth: 1991-01-21  SUBJECTIVE:  Including CC & ROS.  No chief complaint on file.   Melanie Jordan is a 29 y.o. female that is presenting with generalized hypermobility at different joints.  She has had neck pain and elbow pain.  She has been going to physical therapy for vestibular migraines.  She has tried limiting her range of motion as best she can.  She has recurrent popping of the left hip joint as well.   Review of Systems See HPI   HISTORY: Past Medical, Surgical, Social, and Family History Reviewed & Updated per EMR.   Pertinent Historical Findings include:  Past Medical History:  Diagnosis Date  . ADHD    now off Adderal   . Allergy   . Anxiety   . Chronic head pain    taking Metoprolol  . GERD (gastroesophageal reflux disease)   . IBS (irritable bowel syndrome)   . Migraine     Past Surgical History:  Procedure Laterality Date  . ESOPHAGEAL MANOMETRY N/A 10/13/2019   Procedure: ESOPHAGEAL MANOMETRY (EM);  Surgeon: Mauri Pole, MD;  Location: [facility];  Service: Endoscopy;  Laterality: N/A;    Family History  Problem Relation Age of Onset  . Cancer Mother 30       cervical and stomach cancer  . Fibroids Mother        uterine fibroid  . Thyroid disease Mother        hypothyroidism due to [place]  . Osteopenia Mother   . GER disease Mother   . Drug abuse Mother   . Stomach cancer Mother   . Anxiety disorder Mother   . Asthma Brother   . Anxiety disorder Brother   . Irritable bowel syndrome Brother   . Arthritis Maternal Grandmother   . Depression Maternal Grandmother   . Heart disease Maternal Grandmother 63       CAD with stents  . Hyperlipidemia Maternal Grandmother   . Hypertension Maternal Grandmother   . Stroke Maternal Grandmother 72       TIA  . Cancer Paternal Grandmother 80       uterine cancer  . Asthma Brother   . Anxiety disorder Sister   . Depression Father     . ADD / ADHD Maternal Uncle   . Colon cancer Neg Hx   . Esophageal cancer Neg Hx   . Rectal cancer Neg Hx     Social History   Socioeconomic History  . Marital status: Single    Spouse name: Not on file  . Number of children: 0  . Years of education: Not on file  . Highest education level: Associate degree: occupational, Hotel manager, or vocational program  Occupational History  . 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 Smoking status: Never Smoker  . Smokeless tobacco: Never Used  Substance and Sexual Activity  . Alcohol use: Yes    Comment: social  . Drug use: Never  . Sexual activity: Yes    Birth control/protection: Pill  Other Topics Concern  . Not on file  Social History Narrative   Engaged.      Started drinking more during the day - up to ~2 L (since onset of dizziness).      NO routine exercise.      Works in [employer] @ [place]  Financial Resource Strain:   . Difficulty of Paying Living Expenses:   Food Insecurity:   . Worried About Charity fundraiser in the Last Year:   . Arboriculturist in the Last Year:   Transportation Needs:   . Film/video editor (Medical):   Marland Kitchen Lack of Transportation (Non-Medical):   Physical Activity:   . Days of Exercise per Week:   . Minutes of Exercise per Session:   Stress:   . Feeling of Stress :   Social Connections:   . Frequency of Communication with Friends and Family:   . Frequency of Social Gatherings with Friends and Family:   . Attends Religious Services:   . Active Member of Clubs or Organizations:   . Attends Archivist Meetings:   Marland Kitchen Marital Status:   Intimate Partner Violence:   . Fear of Current or Ex-Partner:   . Emotionally Abused:   Marland Kitchen Physically Abused:   . Sexually Abused:      PHYSICAL EXAM:  VS: BP 120/78   Ht 5\' 7"  (1.702 m)   Wt 164 lb (74.4 kg)   BMI 25.69 kg/m  Physical Exam Gen: NAD, alert,  cooperative with exam, well-appearing MSK:  Neck: Normal range of motion. Elbows are able to go in hyperextension. Can place thumbs to the forearm. Can take her pinky finger past 90 degrees. Can hyperextend her knees. Can place hands flat on the floor. Neurovascular intact     ASSESSMENT & PLAN:   Hypermobility syndrome Has laxity in cervical aspect.  Has migraines for which she is getting treated by neurology.  Has had repeated patellar subluxations. -Counseled on home exercise therapy and supportive care. -Counseled on compression. -Could consider physical therapy  Neck pain Is currently undergoing physical therapy.  Her pain is been ongoing for some time.  Has received trigger points. -Pennsaid and samples provided. -Counseled on home exercise therapy and supportive care. -Has Flexeril provided.  Could consider switching to baclofen.

## 2020-03-28 NOTE — Assessment & Plan Note (Signed)
Is currently undergoing physical therapy.  Her pain is been ongoing for some time.  Has received trigger points. -Pennsaid and samples provided. -Counseled on home exercise therapy and supportive care. -Has Flexeril provided.  Could consider switching to baclofen.

## 2020-03-28 NOTE — Progress Notes (Signed)
Medication Samples have been provided to the patient.  Drug name: pensaid     Strength: 2%       Qty: 2 boxes LOT: [id number]  Exp.Date: 1/22  Dosing instructions: pea size amount over the affected area  The patient has been instructed regarding the correct time, dose, and frequency of taking this medication, including desired effects and most common side effects.   April Manson 3:28 PM 03/28/2020

## 2020-03-28 NOTE — Patient Instructions (Signed)
Nice to meet you Please try the exercises  You can try compression  Please let me know if the flexeril works. If it doesn't we can consider a different muscle relaxer.  Please try the pennsaid  Please follow Alla German Bon [email or web address]  for more techniques   Please send me a message in MyChart with any questions or updates.  Please see me back in 6-8 weeks or as needed.   --Dr. Raeford Razor

## 2020-03-28 NOTE — Assessment & Plan Note (Signed)
Has laxity in cervical aspect.  Has migraines for which she is getting treated by neurology.  Has had repeated patellar subluxations. -Counseled on home exercise therapy and supportive care. -Counseled on compression. -Could consider physical therapy

## 2020-03-29 ENCOUNTER — Ambulatory Visit (INDEPENDENT_AMBULATORY_CARE_PROVIDER_SITE_OTHER): Payer: 59 | Admitting: Psychiatry

## 2020-03-29 DIAGNOSIS — F411 Generalized anxiety disorder: Secondary | ICD-10-CM

## 2020-03-29 MED ORDER — PREDNISONE 20 MG PO TABS: 40.0000 mg | ORAL_TABLET | Freq: Every day | ORAL | 0 refills | Status: AC

## 2020-03-29 MED FILL — predniSONE 20 MG TABS: 20 | 5 days supply | Qty: 10 | Fill #0

## 2020-03-29 NOTE — Progress Notes (Signed)
[facility] Counselor/Therapist Progress Note  Patient ID: Melanie Jordan, MRN: [id number],    Date: 03/29/2020  Time Spent: 60 minutes  10:00am to 11:00am  Treatment Type: Individual Therapy  Reported Symptoms: anxiety, depression, concentration and attention "not always as good" and meds are being addressed for that.  States that anxiety is her "worst symptom right now".  Mental Status Exam:  Appearance:   Casual     Behavior:  Appropriate and Sharing  Motor:  Normal  Speech/Language:   Normal Rate  Affect:  anxious, depressed, discouraged  Mood:  anxious and depressed  Thought process:  normal  Thought content:    WNL  Sensory/Perceptual disturbances:    WNL  Orientation:  oriented to person, place, time/date, situation, day of week, month of year and year  Attention:  Fair  Concentration:  Good and Fair  Memory:  [place] of knowledge:   Good  Insight:    Good  Judgment:   Good  Impulse Control:  Good and Fair   Risk Assessment: Danger to Self:  No Self-injurious Behavior: No Danger to Others: No Duty to Warn:no Physical Aggression / Violence:No  Access to Firearms a concern: No  Gang Involvement:No   Subjective: Patient in today reporting anxiety, depression, and discouraged.  Had problems with Adderall recently and it was stopped. Felt discouraged because Adderall had been helping in other ways. "Because my medical problems continue, it's hard to not feel discouraged. Worked with her on looking at her multiple health issues, noting which are at least some better, and which are not better . Reports her IBS is better, migraines and neck pain are some better, and anxiety is better (not constantly worrying as much and not feeling like my chest is vibrating.) I feel like my brain is more sensitive now after dealing with all these health concerns.   Interventions: Cognitive Behavioral Therapy and Solution-Oriented/Positive Psychology  Diagnosis:   ICD-10-CM    1. Generalized anxiety disorder  F41.1     Plan of Care: Patient not signing Tx Plan on computer screen due to COVID-19.  Treatment Goals: Goals will remain on tx plan as patient works on strategies to achieve her goals. Progress will be noted each session in "Progress" section of the Plan.  Long term goal: Reduce overall level, frequency, and intensity of the anxiety so that daily functioning is not impaired.  Short term goal: Verbalize an understanding of the role that anxious/negative/depressive hinking plays in creating fears, excessive worry, and persistent anxiety symptoms.  Strategy: Identify, challenge, and replace fearful self-talk with positive, realistic, and empowering self-talk.  Progress: Patient today feeling anxious, depressed, and discouraged, reporting her discouragement is due to having so many lingering health issues.  As noted above, we worked on some re-framing of her situation to include a broader view and to acknowledge some positives.  Positives include that her IBS has improved significantly, migraines and neck pain have improved some, anxiety has improved some as she reports not worrying as constantly.  So reviewing these points helped patient see that some progress has been made and helped balance out her view of herself and how she is doing. Talked about her thought patterns which are often anxious/negative/depressive and she is to do some self-observing between appts noting the volume of these thought patterns. Homework to include some phone notes of her self-observing and we will pick up on this next session. Currently on 1-10 anxiety scale, she rates herself as a "  5" and a "4" on depression scale.   Goal review and progress noted with patient.  Next appt within 2-3 week.   Shanon Ace, LCSW

## 2020-03-30 ENCOUNTER — Other Ambulatory Visit: Payer: Self-pay | Admitting: Family Medicine

## 2020-03-30 ENCOUNTER — Ambulatory Visit: Payer: 59 | Attending: Neurology | Admitting: Physical Therapy

## 2020-03-30 ENCOUNTER — Other Ambulatory Visit: Payer: Self-pay

## 2020-03-30 ENCOUNTER — Encounter: Payer: Self-pay | Admitting: Physical Therapy

## 2020-03-30 DIAGNOSIS — M542 Cervicalgia: Secondary | ICD-10-CM | POA: Diagnosis not present

## 2020-03-30 DIAGNOSIS — G44221 Chronic tension-type headache, intractable: Secondary | ICD-10-CM | POA: Diagnosis not present

## 2020-03-30 DIAGNOSIS — R2681 Unsteadiness on feet: Secondary | ICD-10-CM

## 2020-03-30 DIAGNOSIS — R293 Abnormal posture: Secondary | ICD-10-CM

## 2020-03-30 DIAGNOSIS — R42 Dizziness and giddiness: Secondary | ICD-10-CM

## 2020-03-30 DIAGNOSIS — G44201 Tension-type headache, unspecified, intractable: Secondary | ICD-10-CM | POA: Diagnosis not present

## 2020-03-30 NOTE — Therapy (Signed)
[facility] [address] [place], [place], [address] Phone: [phone]   Fax:  [phone]  Physical Therapy Treatment  Patient Details  Name: Melanie Jordan MRN: [id number] Date of Birth: [birth year] Referring Provider (PT): Ray Church, MD   Encounter Date: 03/30/2020  PT End of Session - 03/30/20 1202    Visit Number  8    Number of Visits  13    Date for PT Re-Evaluation  05/02/20    Authorization Type  [employer] - [employer]    PT Start Time  9024    PT Stop Time  1150    PT Time Calculation (min)  45 min    Activity Tolerance  Patient tolerated treatment well    Behavior During Therapy  [facility] for tasks assessed/performed       Past Medical History:  Diagnosis Date  . ADHD    now off Adderal   . Allergy   . Anxiety   . Chronic head pain    taking Metoprolol  . GERD (gastroesophageal reflux disease)   . IBS (irritable bowel syndrome)   . Migraine     Past Surgical History:  Procedure Laterality Date  . ESOPHAGEAL MANOMETRY N/A 10/13/2019   Procedure: ESOPHAGEAL MANOMETRY (EM);  Surgeon: Mauri Pole, MD;  Location: [facility];  Service: Endoscopy;  Laterality: N/A;    There were no vitals filed for this visit.  Subjective Assessment - 03/30/20 1107    Subjective  The pool was great, "best I've felt in a year."  Tried to start adderal but had significant reaction - headaches and dizziness.    Pertinent History  migraines, ADHD, generalized anxiety disorder, IBS, B12 deficiency    Diagnostic tests  cervical MRI, LUE nerve conduction study - all WFL    Patient Stated Goals  decrease HA and dizziness    Pain Location  Head    Pain Descriptors / Indicators  Pressure                       [facility] Adult PT Treatment/Exercise - 03/30/20 1126      Neuro Re-ed    Neuro Re-ed Details   Seated on swiss ball performed bouncing x 30 seconds x 3 reps with EO, x30  seconds x 2 sets with EC.  Also performed bouncing with 5 reps head turns, head nods with EO and EC.  With EC and head turns pt began to report nausea and pressure in head - did not perform head turns with EC, bouncing.  Transitioned to supine on mat and performed diaphragmatic breathing (box breathing 4, 4, 4) and educated pt on how to perform body scanning for grounding.        Knee/Hip Exercises: Aerobic   Tread Mill  x 8 minutes at 2.2 mph at moderate pace for warm up and aerobic conditioning; mild dizziness when getting off treadmill that resolved with sitting.              PT Education - 03/30/20 1201    Education Details  will add more land visits and will discuss adding more aquatic visits; body scan for stress management and grounding    Person(s) Educated  Patient    Methods  Explanation;Demonstration    Comprehension  Verbalized understanding;Returned demonstration       PT Short Term Goals - 03/16/20 1824      PT SHORT TERM GOAL #1   Title  Pt will be independent with initial HEP    Baseline  have been adjusting HEP as needed    Time  6    Period  Weeks    Status  Partially Met    Target Date  03/18/20      PT SHORT TERM GOAL #2   Title  Pt will participate in assessment of cervical ROM and muscle tension with LTG to be set    Time  6    Period  Weeks    Status  Achieved    Target Date  03/18/20      PT SHORT TERM GOAL #3   Title  Pt will demonstrate decreased falls risk during gait as indicated by 2 point improvement in FGA    Baseline  26/30 - score remained the same    Time  6    Period  Weeks    Status  Not Met    Target Date  03/18/20      PT SHORT TERM GOAL #4   Title  Pt will report decrease in symptoms of dizziness when performing head movements in various directions and when performing quick body turns    Baseline  decreased dizziness, does feel off balance with head and body turns    Time  6    Period  Weeks    Status  Partially Met    Target  Date  03/18/20        PT Long Term Goals - 02/08/20 1705      PT LONG TERM GOAL #1   Title  Pt will demonstrate independence with final vestibular, balance and aerobic HEP  (LTG due by 05/02/2020)    Time  12    Period  Weeks    Status  New      PT LONG TERM GOAL #2   Title  Pt will demonstrate no falls risk during gait as indicated by score of 30/30 on FGA    Time  12    Period  Weeks    Status  New      PT LONG TERM GOAL #3   Title  Pt will report 50% reduction in swaying sensation when taking a shower, when at work or when sitting still in her car    Time  12    Period  Weeks    Status  New      PT LONG TERM GOAL #4   Title  Pt will demonstrate 10 deg increase in cervical rotation and side bending and report </= 3/10 HA and neck pain on average each week    Baseline  sidebending: 30 deg bilaterally, rotation 60 deg bilaterally  - pain with L sidebending and L rotation    Time  12    Period  Weeks    Status  Revised      PT LONG TERM GOAL #5   Title  Pt will increase FOTO to >/= 79% and DHI will decrease by 18 points overall    Baseline  65%; [address] [place]    Time  12    Period  Weeks    Status  New            Plan - 03/30/20 1113    Clinical Impression Statement  Pt reports significant benefit from aquatic therapy.  Symptoms slightly flared due to medication changes and fluid in ears.  Pt able to tolerate continued aerobic conditioning and vestibular habituation on Swiss ball.  Pt became more  symptomatic with EC, bouncing and head movements so transitioned to mat and performed body scan for grounding and downregulate nervous system.  Pt scheduled for more land appointments and will set up more pool appointments.    Comorbidities  migraines, ADHD, generalized anxiety disorder, IBS, B12 deficiency, not currently taking medication to manage migraines - previous medication had too strong side effects    Examination-Activity Limitations  Locomotion Level;Stand;Sit     Examination-Participation Restrictions  Cleaning;Community Activity;Driving;Shop    PT Frequency  1x / week    PT Duration  12 weeks    PT Treatment/Interventions  ADLs/Self Care Home Management;Aquatic Therapy;Canalith Repostioning;Cryotherapy;Moist Heat;Gait training;Stair training;Functional mobility training;Therapeutic activities;Therapeutic exercise;Balance training;Neuromuscular re-education;Patient/family education;Manual techniques;Passive range of motion;Dry needling;Vestibular    PT Next Visit Plan  Aquatic therapy for stress relief, posture and HEP.  Deep neck flexor strengthening with BP cuff. Progress land HEP as pt is able to tolerate - thoracic spine mobilization, balance - head turns, vision removed, trampoline or physioball?  incorporate aerobic exercise on treadmill - add incline and head turns.       Patient will benefit from skilled therapeutic intervention in order to improve the following deficits and impairments:  Dizziness, Pain, Postural dysfunction, Decreased range of motion, Decreased balance, Difficulty walking  Visit Diagnosis: Dizziness and giddiness  Cervicalgia  Unsteadiness on feet  Chronic tension-type headache, intractable  Abnormal posture  Acute intractable tension-type headache     Problem List Patient Active Problem List   Diagnosis Date Noted  . Hypermobility syndrome 03/28/2020  . Neck pain 03/28/2020  . Dysphagia   . Anterior chest wall pain 08/12/2019  . Dizziness on standing 05/24/2019  . Rapid palpitations 05/24/2019  . Adjustment disorder with anxious mood 04/02/2019  . Initiation of oral contraception 09/21/2018    Rico Junker, PT, DPT 03/30/20    12:09 PM    [facility] [address] [place], [place], [address] Phone: [phone]   Fax:  [phone]  Name: Melanie Jordan MRN: [id number] Date of Birth: 1991-10-01

## 2020-03-31 ENCOUNTER — Other Ambulatory Visit: Payer: Self-pay | Admitting: Family Medicine

## 2020-03-31 DIAGNOSIS — R002 Palpitations: Secondary | ICD-10-CM

## 2020-03-31 DIAGNOSIS — M357 Hypermobility syndrome: Secondary | ICD-10-CM

## 2020-03-31 NOTE — Progress Notes (Unsigned)
Obtaining echo for palpitations and screening with hypermobility   Rosemarie Ax, MD [facility] 03/31/2020, 8:34 AM

## 2020-04-03 ENCOUNTER — Other Ambulatory Visit: Payer: Self-pay

## 2020-04-03 ENCOUNTER — Ambulatory Visit: Payer: 59 | Admitting: Physical Therapy

## 2020-04-03 ENCOUNTER — Encounter: Payer: Self-pay | Admitting: Physical Therapy

## 2020-04-03 ENCOUNTER — Ambulatory Visit: Payer: 59 | Admitting: Rheumatology

## 2020-04-03 DIAGNOSIS — R2681 Unsteadiness on feet: Secondary | ICD-10-CM

## 2020-04-03 DIAGNOSIS — R293 Abnormal posture: Secondary | ICD-10-CM | POA: Diagnosis not present

## 2020-04-03 DIAGNOSIS — M542 Cervicalgia: Secondary | ICD-10-CM

## 2020-04-03 DIAGNOSIS — G44221 Chronic tension-type headache, intractable: Secondary | ICD-10-CM | POA: Diagnosis not present

## 2020-04-03 DIAGNOSIS — R42 Dizziness and giddiness: Secondary | ICD-10-CM

## 2020-04-03 DIAGNOSIS — G44201 Tension-type headache, unspecified, intractable: Secondary | ICD-10-CM | POA: Diagnosis not present

## 2020-04-03 NOTE — Therapy (Signed)
[facility] [address] [place], [place], [address] Phone: [phone]   Fax:  [phone]  Physical Therapy Treatment  Patient Details  Name: Melanie Jordan MRN: [id number] Date of Birth: 01/28/1991 Referring Provider (PT): Ray Church, MD   Encounter Date: 04/03/2020  PT End of Session - 04/03/20 1900    Visit Number  9    Number of Visits  13    Date for PT Re-Evaluation  05/02/20    Authorization Type  [employer] - [employer]    PT Start Time  1330    PT Stop Time  1415    PT Time Calculation (min)  45 min    Equipment Utilized During Treatment  Other (comment)   ankle buoyancy cuffs   Activity Tolerance  Patient tolerated treatment well    Behavior During Therapy  [facility] for tasks assessed/performed       Past Medical History:  Diagnosis Date  . ADHD    now off Adderal   . Allergy   . Anxiety   . Chronic head pain    taking Metoprolol  . GERD (gastroesophageal reflux disease)   . IBS (irritable bowel syndrome)   . Migraine     Past Surgical History:  Procedure Laterality Date  . ESOPHAGEAL MANOMETRY N/A 10/13/2019   Procedure: ESOPHAGEAL MANOMETRY (EM);  Surgeon: Mauri Pole, MD;  Location: [facility];  Service: Endoscopy;  Laterality: N/A;    There were no vitals filed for this visit.  Subjective Assessment - 04/03/20 1859    Subjective  States that she felt the best she's felt in years after last pool session.    Pertinent History  migraines, ADHD, generalized anxiety disorder, IBS, B12 deficiency    Diagnostic tests  cervical MRI, LUE nerve conduction study - all WFL    Patient Stated Goals  decrease HA and dizziness    Currently in Pain?  No/denies       Aquatic therapy at [facility] - pool temp 86.7degrees  Patient seen for aquatic therapy today. Treatment took place in water 3.5-4 feet deep depending upon activity. Pt entered and exited the pool viaramp and  supervisions.  Runners stretch bil LE's and toes/feet up edge of pool x 30 seconds each bil LE'sboth at beginning and end of session.  Pt performed gait training in pool forwards 12mx 2 repswithout UE support,267m 2 working on increased armswing,2014m2 marchingthen 66m57m repsbackwards. Performed side stepping squatsx 66m 16mith UE shoulder abd/add.Cues for technique/sequence.  Jogging 18m x1mith rest break in between.  Half jumping jacks x 15 reps with UE support.  SLS 10 sec x 2 reps each side, Tandem stance x 10 sec each direction x 2 reps.  Tandem gait60m x 63mBraiding 60m x 236ms.  Standing with intermittentUE support for LE marching, hip extension, hip abd, hamstring curl, toe raises, heel raises and hip flexion into hip extension . Pt able to perform 15 repsalternating LE'seach side with ankle buoyancy cuffs.  Squats x 15reps x 2 set s without UE assistthen single leg x 15 reps each.  Ai Chi postures of enclosing, soothing, freeing and balancing x 15 reps each.   Pt requires buoyancy for support with balance and viscosity for resistance for strengthening exercises; buoyancy is also needed for off loading body to assist with exercises.  PT Short Term Goals - 03/16/20 1824      PT SHORT  TERM GOAL #1   Title  Pt will be independent with initial HEP    Baseline  have been adjusting HEP as needed    Time  6    Period  Weeks    Status  Partially Met    Target Date  03/18/20      PT SHORT TERM GOAL #2   Title  Pt will participate in assessment of cervical ROM and muscle tension with LTG to be set    Time  6    Period  Weeks    Status  Achieved    Target Date  03/18/20      PT SHORT TERM GOAL #3   Title  Pt will demonstrate decreased falls risk during gait as indicated by 2 point improvement in FGA    Baseline  26/30 - score remained the same    Time  6    Period  Weeks    Status  Not Met    Target Date  03/18/20      PT SHORT  TERM GOAL #4   Title  Pt will report decrease in symptoms of dizziness when performing head movements in various directions and when performing quick body turns    Baseline  decreased dizziness, does feel off balance with head and body turns    Time  6    Period  Weeks    Status  Partially Met    Target Date  03/18/20        PT Long Term Goals - 02/08/20 1705      PT LONG TERM GOAL #1   Title  Pt will demonstrate independence with final vestibular, balance and aerobic HEP  (LTG due by 05/02/2020)    Time  12    Period  Weeks    Status  New      PT LONG TERM GOAL #2   Title  Pt will demonstrate no falls risk during gait as indicated by score of 30/30 on FGA    Time  12    Period  Weeks    Status  New      PT LONG TERM GOAL #3   Title  Pt will report 50% reduction in swaying sensation when taking a shower, when at work or when sitting still in her car    Time  12    Period  Weeks    Status  New      PT LONG TERM GOAL #4   Title  Pt will demonstrate 10 deg increase in cervical rotation and side bending and report </= 3/10 HA and neck pain on average each week    Baseline  sidebending: 30 deg bilaterally, rotation 60 deg bilaterally  - pain with L sidebending and L rotation    Time  12    Period  Weeks    Status  Revised      PT LONG TERM GOAL #5   Title  Pt will increase FOTO to >/= 79% and DHI will decrease by 18 points overall    Baseline  65%; 56 [place]    Time  12    Period  Weeks    Status  New            Plan - 04/03/20 1901    Clinical Impression Statement  Skilled session continued to focus on relaxation, flexibility, balance and strengthening. Pt reports significant benefit from last aquatic session.  Cont per POC.    Comorbidities  migraines, ADHD,  generalized anxiety disorder, IBS, B12 deficiency, not currently taking medication to manage migraines - previous medication had too strong side effects    Examination-Activity Limitations  Locomotion  Level;Stand;Sit    Examination-Participation Restrictions  Cleaning;Community Activity;Driving;Shop    PT Frequency  1x / week    PT Duration  12 weeks    PT Treatment/Interventions  ADLs/Self Care Home Management;Aquatic Therapy;Canalith Repostioning;Cryotherapy;Moist Heat;Gait training;Stair training;Functional mobility training;Therapeutic activities;Therapeutic exercise;Balance training;Neuromuscular re-education;Patient/family education;Manual techniques;Passive range of motion;Dry needling;Vestibular    PT Next Visit Plan  Aquatic therapy for stress relief, posture and HEP.  Deep neck flexor strengthening with BP cuff. Progress land HEP as pt is able to tolerate - thoracic spine mobilization, balance - head turns, vision removed, trampoline or physioball?  incorporate aerobic exercise on treadmill - add incline and head turns.       Patient will benefit from skilled therapeutic intervention in order to improve the following deficits and impairments:  Dizziness, Pain, Postural dysfunction, Decreased range of motion, Decreased balance, Difficulty walking  Visit Diagnosis: Dizziness and giddiness  Cervicalgia  Unsteadiness on feet     Problem List Patient Active Problem List   Diagnosis Date Noted  . Hypermobility syndrome 03/28/2020  . Neck pain 03/28/2020  . Dysphagia   . Anterior chest wall pain 08/12/2019  . Dizziness on standing 05/24/2019  . Rapid palpitations 05/24/2019  . Adjustment disorder with anxious mood 04/02/2019  . Initiation of oral contraception 09/21/2018    Narda Bonds, PTA [facility] 04/03/20 7:06 PM Phone: [phone] Fax: [facility] [address] [place], [place], [address] Phone: [phone]   Fax:  [phone]  Name: Melanie Jordan MRN: [id number] Date of Birth: 1991/03/14

## 2020-04-04 ENCOUNTER — Telehealth: Payer: Self-pay | Admitting: Family Medicine

## 2020-04-04 ENCOUNTER — Encounter: Payer: Self-pay | Admitting: Family Medicine

## 2020-04-04 ENCOUNTER — Ambulatory Visit (HOSPITAL_BASED_OUTPATIENT_CLINIC_OR_DEPARTMENT_OTHER)
Admission: RE | Admit: 2020-04-04 | Discharge: 2020-04-04 | Disposition: A | Discharge status: Home / Self Care | Payer: 59 | Source: Ambulatory Visit | Attending: Family Medicine | Admitting: Family Medicine

## 2020-04-04 DIAGNOSIS — M357 Hypermobility syndrome: Secondary | ICD-10-CM

## 2020-04-04 DIAGNOSIS — R009 Unspecified abnormalities of heart beat: Secondary | ICD-10-CM

## 2020-04-04 DIAGNOSIS — R002 Palpitations: Secondary | ICD-10-CM

## 2020-04-04 NOTE — Progress Notes (Signed)
  Echocardiogram 2D Echocardiogram has been performed.  Melanie Jordan 04/04/2020, 12:07 PM

## 2020-04-04 NOTE — Telephone Encounter (Signed)
Informed of result.   Rosemarie Ax, MD [facility] 04/04/2020, 4:47 PM

## 2020-04-06 ENCOUNTER — Ambulatory Visit: Payer: 59 | Admitting: Physical Therapy

## 2020-04-12 ENCOUNTER — Other Ambulatory Visit: Payer: Self-pay

## 2020-04-12 ENCOUNTER — Ambulatory Visit: Payer: 59 | Admitting: Physical Therapy

## 2020-04-12 ENCOUNTER — Ambulatory Visit (INDEPENDENT_AMBULATORY_CARE_PROVIDER_SITE_OTHER): Payer: 59 | Admitting: Psychiatry

## 2020-04-12 DIAGNOSIS — G44221 Chronic tension-type headache, intractable: Secondary | ICD-10-CM | POA: Diagnosis not present

## 2020-04-12 DIAGNOSIS — R2681 Unsteadiness on feet: Secondary | ICD-10-CM | POA: Diagnosis not present

## 2020-04-12 DIAGNOSIS — M542 Cervicalgia: Secondary | ICD-10-CM

## 2020-04-12 DIAGNOSIS — F411 Generalized anxiety disorder: Secondary | ICD-10-CM

## 2020-04-12 DIAGNOSIS — R293 Abnormal posture: Secondary | ICD-10-CM | POA: Diagnosis not present

## 2020-04-12 DIAGNOSIS — R42 Dizziness and giddiness: Secondary | ICD-10-CM | POA: Diagnosis not present

## 2020-04-12 DIAGNOSIS — G44201 Tension-type headache, unspecified, intractable: Secondary | ICD-10-CM | POA: Diagnosis not present

## 2020-04-12 NOTE — Progress Notes (Signed)
[facility] Counselor/Therapist Progress Note  Patient ID: Melanie Jordan, MRN: [id number],    Date: 04/12/2020  Time Spent: 60 minutes 1:00pm to 2:00pm  Treatment Type: Individual Therapy  Reported Symptoms: anxiety, some depression, worrying  Mental Status Exam:  Appearance:   Casual     Behavior:  Appropriate, Sharing and Motivated  Motor:  Normal  Speech/Language:   Normal Rate  Affect:  anxiety, depression  Mood:  anxious and depressed  Thought process:  goal directed  Thought content:    WNL  Sensory/Perceptual disturbances:    WNL  Orientation:  oriented to person, place, time/date, situation, day of week, month of year and year  Attention:  Good  Concentration:  Good  Memory:  WNL  Fund of knowledge:   Good  Insight:    Good and Fair  Judgment:   Good and Fair  Impulse Control:  Good and Fair   Risk Assessment: Danger to Self:  No Self-injurious Behavior: No Danger to Others: No Duty to Warn:no Physical Aggression / Violence:No  Access to Firearms a concern: No  Gang Involvement:No   Subjective: Patient in today with anxiety and depression.  Anxiety is her stronger symptom.   Interventions: Cognitive Behavioral Therapy and Ego-Supportive  Diagnosis:   ICD-10-CM   1. Generalized anxiety disorder  F41.1     Plan of Care: Patient not signing Tx Plan on computer screen due to COVID-19.  Treatment Goals: Goals will remain on tx plan as patient works on strategies to achieve her goals. Progress will be noted each session in "Progress" section of the Plan.  Long term goal: Reduce overall level, frequency, and intensity of the anxiety so that daily functioning is not impaired.  Short term goal: Verbalize an understanding of the role that anxious/negative/depressive hinking plays in creating fears, excessive worry, and persistent anxiety symptoms.  Strategy: Identify, challenge, and replace fearful self-talk with positive, realistic, and  empowering self-talk.  Progress: Patient in reporting today continued anxiety and depression, with anxiety being the stronger symptom. Having more work-related/co-worker issues. She knows she can't change that and finds it hard to let go of at times but is working harder on that.  "My health issues are my main concern as a stressor." Some additional stress as she plans some vacation travels which creates some extra anxiety for her. Currently on Anxiety 1-10 scale she is up some from last session, now at a "6".  Patient shares some things that help her deal with these stressors including:  "I do feel like I'm trying to manage my thoughts and reactions to my physical symptoms better. " Doesn't let herself sink in discouragement.  Trying to look for more positives in situations, which we discussed last session, and patient is able to list her positives.  Focusing on some progress that has been made. Trying to improve her thought patterns to not being so anxious/negative/depressive. Following up on self-observing from last session ans she gaining more self-awareness which helps her target changes.  Patient is finding that these things do contribute to her efforts on long term goal above in trying to reduce the level, frequency, and intensity of her anxiety, and she is noticing some reduction in the intensity of some of her anxiety, which is a real accomplishment for her and has help increase her motivation.  Goal review and progress noted with patient .  Next appt within 2 wks.   Shanon Ace, LCSW

## 2020-04-14 ENCOUNTER — Encounter: Payer: Self-pay | Admitting: Physical Therapy

## 2020-04-14 NOTE — Therapy (Signed)
[facility] [address] [place], [place], [address] Phone: [phone]   Fax:  [phone]  Physical Therapy Treatment  Patient Details  Name: Melanie Jordan MRN: [id number] Date of Birth: 1991-04-27 Referring Provider (PT): Ray Church, MD   Encounter Date: 04/12/2020  PT End of Session - 04/14/20 1013    Visit Number  10    Number of Visits  13    Date for PT Re-Evaluation  05/02/20    Authorization Type  [employer] - [employer]    PT Start Time  2876   arrived late due to medical appt prior to   PT Stop Time  1500    PT Time Calculation (min)  33 min    Equipment Utilized During Treatment  Other (comment)   ankle buoyancy cuffs   Activity Tolerance  Patient tolerated treatment well    Behavior During Therapy  [facility] for tasks assessed/performed       Past Medical History:  Diagnosis Date  . ADHD    now off Adderal   . Allergy   . Anxiety   . Chronic head pain    taking Metoprolol  . GERD (gastroesophageal reflux disease)   . IBS (irritable bowel syndrome)   . Migraine     Past Surgical History:  Procedure Laterality Date  . ESOPHAGEAL MANOMETRY N/A 10/13/2019   Procedure: ESOPHAGEAL MANOMETRY (EM);  Surgeon: Mauri Pole, MD;  Location: [facility];  Service: Endoscopy;  Laterality: N/A;    There were no vitals filed for this visit.  Subjective Assessment - 04/14/20 1012    Subjective  A little stressed from having to rush today because she had another appointment right before this session.  States the stress increases her HA.  Still having pressure in ears and is calling her ENT.    Pertinent History  migraines, ADHD, generalized anxiety disorder, IBS, B12 deficiency    Diagnostic tests  cervical MRI, LUE nerve conduction study - all WFL    Patient Stated Goals  decrease HA and dizziness    Currently in Pain?  Yes    Pain Score  4     Pain Location  Head    Pain  Descriptors / Indicators  Headache    Pain Type  Chronic pain       Aquatic therapy at [facility] - pool temp 86.7degrees  Patient seen for aquatic therapy today. Treatment took place in water 3.5-4 feet deep depending upon activity. Pt entered and exited the pool viaramp and supervisions.  Runners stretch bil LE's and toes/feet up edge of pool x 30 seconds each bil LE'sboth at beginning and end of session.  Pt performed gait training in pool forwards 54mx 2 repswithout UE support,251m 2 working on increased armswing,2079m2 marchingthen 72m67m repsbackwards. Performed side stepping squatsx 72m 62mith UE shoulder abd/add and hand bar bells for resistance.Cues for technique/sequence.  Jogging 65m x34mh rest break in between.  Half jumping jacks x 15 reps with UE support x 2 sets.  Tandem gait13m x 58mraiding 13m x 246ms.  Standing with intermittentUE supportforLE marching, hip extension, hip abd, hamstring curl, toe raises, heel raises and hip flexion into hip extension. Pt able to perform 15 repsalternating LE'seach side with ankle buoyancy cuffs.  Squats x 15reps x 2 set s without UE assistthen single leg x 15 reps each.  Ai Chi postures ofenclosing, soothing, freeing and balancing x 15  reps each.   Pt requires buoyancy for support with balance and viscosity for resistance for strengthening exercises; buoyancy is also needed for off loading body to assist with exercises.    PT Short Term Goals - 03/16/20 1824      PT SHORT TERM GOAL #1   Title  Pt will be independent with initial HEP    Baseline  have been adjusting HEP as needed    Time  6    Period  Weeks    Status  Partially Met    Target Date  03/18/20      PT SHORT TERM GOAL #2   Title  Pt will participate in assessment of cervical ROM and muscle tension with LTG to be set    Time  6    Period  Weeks    Status  Achieved    Target Date  03/18/20      PT SHORT TERM GOAL  #3   Title  Pt will demonstrate decreased falls risk during gait as indicated by 2 point improvement in FGA    Baseline  26/30 - score remained the same    Time  6    Period  Weeks    Status  Not Met    Target Date  03/18/20      PT SHORT TERM GOAL #4   Title  Pt will report decrease in symptoms of dizziness when performing head movements in various directions and when performing quick body turns    Baseline  decreased dizziness, does feel off balance with head and body turns    Time  6    Period  Weeks    Status  Partially Met    Target Date  03/18/20        PT Long Term Goals - 02/08/20 1705      PT LONG TERM GOAL #1   Title  Pt will demonstrate independence with final vestibular, balance and aerobic HEP  (LTG due by 05/02/2020)    Time  12    Period  Weeks    Status  New      PT LONG TERM GOAL #2   Title  Pt will demonstrate no falls risk during gait as indicated by score of 30/30 on FGA    Time  12    Period  Weeks    Status  New      PT LONG TERM GOAL #3   Title  Pt will report 50% reduction in swaying sensation when taking a shower, when at work or when sitting still in her car    Time  12    Period  Weeks    Status  New      PT LONG TERM GOAL #4   Title  Pt will demonstrate 10 deg increase in cervical rotation and side bending and report </= 3/10 HA and neck pain on average each week    Baseline  sidebending: 30 deg bilaterally, rotation 60 deg bilaterally  - pain with L sidebending and L rotation    Time  12    Period  Weeks    Status  Revised      PT LONG TERM GOAL #5   Title  Pt will increase FOTO to >/= 79% and DHI will decrease by 18 points overall    Baseline  65%; 56 DHI    Time  12    Period  Weeks    Status  New  Plan - 04/14/20 1014    Clinical Impression Statement  Continues to report improvement after aquatic sessions and works hard during sessions.  Cont per POC.    Comorbidities  migraines, ADHD, generalized anxiety disorder,  IBS, B12 deficiency, not currently taking medication to manage migraines - previous medication had too strong side effects    Examination-Activity Limitations  Locomotion Level;Stand;Sit    Examination-Participation Restrictions  Cleaning;Community Activity;Driving;Shop    PT Frequency  1x / week    PT Duration  12 weeks    PT Treatment/Interventions  ADLs/Self Care Home Management;Aquatic Therapy;Canalith Repostioning;Cryotherapy;Moist Heat;Gait training;Stair training;Functional mobility training;Therapeutic activities;Therapeutic exercise;Balance training;Neuromuscular re-education;Patient/family education;Manual techniques;Passive range of motion;Dry needling;Vestibular    PT Next Visit Plan  Aquatic therapy for stress relief, posture and HEP.  Deep neck flexor strengthening with BP cuff. Progress land HEP as pt is able to tolerate - thoracic spine mobilization, balance - head turns, vision removed, trampoline or physioball?  incorporate aerobic exercise on treadmill - add incline and head turns.       Patient will benefit from skilled therapeutic intervention in order to improve the following deficits and impairments:  Dizziness, Pain, Postural dysfunction, Decreased range of motion, Decreased balance, Difficulty walking  Visit Diagnosis: Dizziness and giddiness  Cervicalgia  Unsteadiness on feet     Problem List Patient Active Problem List   Diagnosis Date Noted  . Hypermobility syndrome 03/28/2020  . Neck pain 03/28/2020  . Dysphagia   . Anterior chest wall pain 08/12/2019  . Dizziness on standing 05/24/2019  . Rapid palpitations 05/24/2019  . Adjustment disorder with anxious mood 04/02/2019  . Initiation of oral contraception 09/21/2018    Narda Bonds, PTA [place] 04/14/20 10:19 AM Phone: [phone] Fax: [facility] [address] [place], [place], [address] Phone: [phone]   Fax:  [phone]  Name: Melanie Jordan MRN: [id number] Date of Birth: [birth year]

## 2020-04-20 ENCOUNTER — Encounter: Payer: Self-pay | Admitting: Physical Therapy

## 2020-04-20 ENCOUNTER — Ambulatory Visit: Payer: 59 | Admitting: Physical Therapy

## 2020-04-20 ENCOUNTER — Other Ambulatory Visit: Payer: Self-pay

## 2020-04-20 DIAGNOSIS — R2681 Unsteadiness on feet: Secondary | ICD-10-CM

## 2020-04-20 DIAGNOSIS — R293 Abnormal posture: Secondary | ICD-10-CM | POA: Diagnosis not present

## 2020-04-20 DIAGNOSIS — R42 Dizziness and giddiness: Secondary | ICD-10-CM | POA: Diagnosis not present

## 2020-04-20 DIAGNOSIS — G44201 Tension-type headache, unspecified, intractable: Secondary | ICD-10-CM | POA: Diagnosis not present

## 2020-04-20 DIAGNOSIS — M542 Cervicalgia: Secondary | ICD-10-CM | POA: Diagnosis not present

## 2020-04-20 DIAGNOSIS — G44221 Chronic tension-type headache, intractable: Secondary | ICD-10-CM

## 2020-04-20 NOTE — Therapy (Signed)
[facility] [address] [place], [place], [address] Phone: [phone]   Fax:  [phone]  Physical Therapy Treatment  Patient Details  Name: Melanie Jordan MRN: [id number] Date of Birth: 11/29/1990 Referring Provider (PT): Ray Church, MD   Encounter Date: 04/20/2020  PT End of Session - 04/20/20 1746    Visit Number  11    Number of Visits  13    Date for PT Re-Evaluation  05/02/20    Authorization Type  [employer] - [employer]    PT Start Time  1030    PT Stop Time  1115    PT Time Calculation (min)  45 min    Equipment Utilized During Treatment  --    Activity Tolerance  Patient tolerated treatment well    Behavior During Therapy  [facility] for tasks assessed/performed       Past Medical History:  Diagnosis Date  . ADHD    now off Adderal   . Allergy   . Anxiety   . Chronic head pain    taking Metoprolol  . GERD (gastroesophageal reflux disease)   . IBS (irritable bowel syndrome)   . Migraine     Past Surgical History:  Procedure Laterality Date  . ESOPHAGEAL MANOMETRY N/A 10/13/2019   Procedure: ESOPHAGEAL MANOMETRY (EM);  Surgeon: Mauri Pole, MD;  Location: [facility];  Service: Endoscopy;  Laterality: N/A;    There were no vitals filed for this visit.  Subjective Assessment - 04/20/20 1033    Subjective  Headaches and dizziness are about the same - is seeing a small decrease in headaches with injections.  Has had second injection - relief lasts about 2 weeks.  Pt is drinking more coffee and bananas which are triggers.  Pool is going well.  Pt reports new onset of occipital neuralgia - reports increased tension in neck due to starting a new workout program -weight lifting and thinks it might be tightening up her neck.    Pertinent History  migraines, ADHD, generalized anxiety disorder, IBS, B12 deficiency    Diagnostic tests  cervical MRI, LUE nerve conduction study - all  WFL    Patient Stated Goals  decrease HA and dizziness    Currently in Pain?  Yes    Pain Score  5     Pain Location  Head    Pain Descriptors / Indicators  Headache    Pain Type  Neuropathic pain        [facility] Adult PT Treatment/Exercise - 04/20/20 1040      Neck Exercises: Supine   Neck Retraction  10 reps;5 secs    Neck Retraction Limitations  for deep neck flexor strengthening; while applying heat for tension relief    Other Supine Exercise  Scapular retraction x 6 second hold x 10 reps while applying heat to neck for tension relief      Moist Heat Therapy   Number Minutes Moist Heat  20 Minutes    Moist Heat Location  Cervical      Manual Therapy   Manual Therapy  Joint mobilization;Myofascial release;Soft tissue mobilization;Manual Traction    Manual therapy comments  Performed in supine in combination with heat therapy to decrease mm tension and improve ROM for rotation    Joint Mobilization  In supine performed grade II PA mobilizations segmentally to whole cervical spine bilaterally but noting hypomobility on L side; with head rotated to R also performed segmental mobilizations/upglides to assist  with increasing rotation to R.  When performing segmental joint mobilizations pt noted to have small twitches when therapist pressed on muscle trigger points.  Pt would likely benefit from dry needling but requested not to perform today    Soft tissue mobilization  Performed STM R anterior scalene and L SCM muscles to decrease tension.    Myofascial Release  suboccipital release in combination with manual traction at beginning and end of session; gradually increased amount of release/pressure provided to improve tolerance.      Manual Traction  while performing suboccipital release with gradually increasing amount of traction; performed at beginning and end of session.  When performed at end of session pt reporting a pulling sensation that went from neck > tailbone which may indicate  overall increased neural tension               PT Short Term Goals - 03/16/20 1824      PT SHORT TERM GOAL #1   Title  Pt will be independent with initial HEP    Baseline  have been adjusting HEP as needed    Time  6    Period  Weeks    Status  Partially Met    Target Date  03/18/20      PT SHORT TERM GOAL #2   Title  Pt will participate in assessment of cervical ROM and muscle tension with LTG to be set    Time  6    Period  Weeks    Status  Achieved    Target Date  03/18/20      PT SHORT TERM GOAL #3   Title  Pt will demonstrate decreased falls risk during gait as indicated by 2 point improvement in FGA    Baseline  26/30 - score remained the same    Time  6    Period  Weeks    Status  Not Met    Target Date  03/18/20      PT SHORT TERM GOAL #4   Title  Pt will report decrease in symptoms of dizziness when performing head movements in various directions and when performing quick body turns    Baseline  decreased dizziness, does feel off balance with head and body turns    Time  6    Period  Weeks    Status  Partially Met    Target Date  03/18/20        PT Long Term Goals - 02/08/20 1705      PT LONG TERM GOAL #1   Title  Pt will demonstrate independence with final vestibular, balance and aerobic HEP  (LTG due by 05/02/2020)    Time  12    Period  Weeks    Status  New      PT LONG TERM GOAL #2   Title  Pt will demonstrate no falls risk during gait as indicated by score of 30/30 on FGA    Time  12    Period  Weeks    Status  New      PT LONG TERM GOAL #3   Title  Pt will report 50% reduction in swaying sensation when taking a shower, when at work or when sitting still in her car    Time  12    Period  Weeks    Status  New      PT LONG TERM GOAL #4   Title  Pt will demonstrate 10 deg increase in cervical  rotation and side bending and report </= 3/10 HA and neck pain on average each week    Baseline  sidebending: 30 deg bilaterally, rotation 60 deg  bilaterally  - pain with L sidebending and L rotation    Time  12    Period  Weeks    Status  Revised      PT LONG TERM GOAL #5   Title  Pt will increase FOTO to >/= 79% and DHI will decrease by 18 points overall    Baseline  65%; 56 DHI    Time  12    Period  Weeks    Status  New            Plan - 04/20/20 1747    Clinical Impression Statement  Treatment session focused on use of heat, manual therapy and neck/back strengthening exercises to decrease pain/neck tension and improve functional ROM due to return of occipital neuralgia and referral pattern.  After heat and manual therapy pt reported no change in frontal headache but did report decreased tension in neck and improved ROM.  Pt may benefit from review of weight lifting exercises she is performing to determine compensatory movement patterns and assessment of neural tension.  Will continue to address and progress towards LTG.    Comorbidities  migraines, ADHD, generalized anxiety disorder, IBS, B12 deficiency, not currently taking medication to manage migraines - previous medication had too strong side effects    Examination-Activity Limitations  Locomotion Level;Stand;Sit    Examination-Participation Restrictions  Cleaning;Community Activity;Driving;Shop    PT Frequency  1x / week    PT Duration  12 weeks    PT Treatment/Interventions  ADLs/Self Care Home Management;Aquatic Therapy;Canalith Repostioning;Cryotherapy;Moist Heat;Gait training;Stair training;Functional mobility training;Therapeutic activities;Therapeutic exercise;Balance training;Neuromuscular re-education;Patient/family education;Manual techniques;Passive range of motion;Dry needling;Vestibular    PT Next Visit Plan  Aquatic therapy for stress relief, posture and HEP.  Review how she is doing weight lifting to see if she is overusing neck mm.  Assess full body neural tension - give nerve glides.  Deep neck flexor strengthening with BP cuff. Progress land HEP as pt is  able to tolerate - thoracic spine mobilization, balance - head turns, vision removed, trampoline or physioball?  incorporate aerobic exercise on treadmill - add incline and head turns    Consulted and Agree with Plan of Care  Patient       Patient will benefit from skilled therapeutic intervention in order to improve the following deficits and impairments:  Dizziness, Pain, Postural dysfunction, Decreased range of motion, Decreased balance, Difficulty walking  Visit Diagnosis: Dizziness and giddiness  Cervicalgia  Unsteadiness on feet  Chronic tension-type headache, intractable  Abnormal posture     Problem List Patient Active Problem List   Diagnosis Date Noted  . Hypermobility syndrome 03/28/2020  . Neck pain 03/28/2020  . Dysphagia   . Anterior chest wall pain 08/12/2019  . Dizziness on standing 05/24/2019  . Rapid palpitations 05/24/2019  . Adjustment disorder with anxious mood 04/02/2019  . Initiation of oral contraception 09/21/2018   Rico Junker, PT, DPT 04/20/20    6:20 PM    [facility] [address] [place], [place], [address] Phone: [phone]   Fax:  [phone]  Name: PAELYN SMICK MRN: [id number] Date of Birth: 06/28/1991

## 2020-04-26 ENCOUNTER — Other Ambulatory Visit: Payer: Self-pay

## 2020-04-26 ENCOUNTER — Encounter: Payer: Self-pay | Admitting: Physical Therapy

## 2020-04-26 ENCOUNTER — Ambulatory Visit: Payer: 59 | Attending: Neurology | Admitting: Physical Therapy

## 2020-04-26 DIAGNOSIS — R2681 Unsteadiness on feet: Secondary | ICD-10-CM | POA: Insufficient documentation

## 2020-04-26 DIAGNOSIS — M542 Cervicalgia: Secondary | ICD-10-CM | POA: Diagnosis not present

## 2020-04-26 DIAGNOSIS — R42 Dizziness and giddiness: Secondary | ICD-10-CM | POA: Insufficient documentation

## 2020-04-26 DIAGNOSIS — G44221 Chronic tension-type headache, intractable: Secondary | ICD-10-CM | POA: Diagnosis not present

## 2020-04-26 DIAGNOSIS — R293 Abnormal posture: Secondary | ICD-10-CM | POA: Insufficient documentation

## 2020-04-26 NOTE — Therapy (Signed)
[facility] [address] [place], [place], [address] Phone: [phone]   Fax:  [phone]  Physical Therapy Treatment  Patient Details  Name: Melanie Jordan MRN: [id number] Date of Birth: 09-Mar-1991 Referring Provider (PT): Ray Church, MD   Encounter Date: 04/26/2020  PT End of Session - 04/26/20 1911    Visit Number  12    Number of Visits  13    Date for PT Re-Evaluation  05/02/20    Authorization Type  [employer] - [employer]    PT Start Time  4287    PT Stop Time  1500    PT Time Calculation (min)  45 min    Activity Tolerance  Patient tolerated treatment well    Behavior During Therapy  [facility] for tasks assessed/performed       Past Medical History:  Diagnosis Date  . ADHD    now off Adderal   . Allergy   . Anxiety   . Chronic head pain    taking Metoprolol  . GERD (gastroesophageal reflux disease)   . IBS (irritable bowel syndrome)   . Migraine     Past Surgical History:  Procedure Laterality Date  . ESOPHAGEAL MANOMETRY N/A 10/13/2019   Procedure: ESOPHAGEAL MANOMETRY (EM);  Surgeon: Mauri Pole, MD;  Location: [facility];  Service: Endoscopy;  Laterality: N/A;    There were no vitals filed for this visit.  Subjective Assessment - 04/26/20 1909    Subjective  Pt reporting increased dizziness lately.  Knows she has food triggers but states she has a difficult time with food restrictions.  States she did [employer] workout this morning and thinks that actually makes her feel worse.    Pertinent History  migraines, ADHD, generalized anxiety disorder, IBS, B12 deficiency    Diagnostic tests  cervical MRI, LUE nerve conduction study - all WFL    Patient Stated Goals  decrease HA and dizziness    Currently in Pain?  Yes    Pain Score  5     Pain Location  Head    Pain Descriptors / Indicators  Headache    Pain Type  Chronic pain    Pain Onset  More than a month ago    Pain Frequency  Constant    Aggravating Factors   certain foods    Pain Relieving Factors  aquatics          Aquatic therapy at [facility] - pool temp 86.7degrees  Patient seen for aquatic therapy today. Treatment took place in water 3.5-4 feet deep depending upon activity. Pt entered and exited the pool viastairs and supervision.  Runners stretch bil LE's and toes/feet up edge of pool x 30 seconds each bil LE'sboth at beginning and end of session.  Pt performed gait training in pool forwards 50mx 2 repswithout UE support,286m 2 working on increased armswing,2540m2 marching, 54m85m repsbackwards. Forward walking 54m 23mwith pool arm bar bells under water for resistance.  Performed side stepping squatsx 99m x61mth UE shoulder abd/add and hand bar bells for resistance.Cues for technique/sequence.  Jogging 54m x256m rest break in between.   Tandemgait54m x 242maiding 54m x 2 49m.  Standing with intermittentUE supportforLE marching, hip extension, hip abd, hamstring curl, toe raises, heel raises and hip flexion into hip extension. Pt able to perform 15 repsalternating LE'seach sidewith ankle buoyancy cuffs.  Squats x 15repswithout UE assistthen single leg x 15  reps each.  Ai Chi postures ofenclosing, soothing,freeingand balancingx 15 reps each.   Pt requires buoyancy for support with balance and viscosity for resistance for strengthening exercises; buoyancy is also needed for off loading body to assist with exercises.   PT Short Term Goals - 03/16/20 1824      PT SHORT TERM GOAL #1   Title  Pt will be independent with initial HEP    Baseline  have been adjusting HEP as needed    Time  6    Period  Weeks    Status  Partially Met    Target Date  03/18/20      PT SHORT TERM GOAL #2   Title  Pt will participate in assessment of cervical ROM and muscle tension with LTG to be set    Time  6    Period  Weeks    Status  Achieved     Target Date  03/18/20      PT SHORT TERM GOAL #3   Title  Pt will demonstrate decreased falls risk during gait as indicated by 2 point improvement in FGA    Baseline  26/30 - score remained the same    Time  6    Period  Weeks    Status  Not Met    Target Date  03/18/20      PT SHORT TERM GOAL #4   Title  Pt will report decrease in symptoms of dizziness when performing head movements in various directions and when performing quick body turns    Baseline  decreased dizziness, does feel off balance with head and body turns    Time  6    Period  Weeks    Status  Partially Met    Target Date  03/18/20        PT Long Term Goals - 02/08/20 1705      PT LONG TERM GOAL #1   Title  Pt will demonstrate independence with final vestibular, balance and aerobic HEP  (LTG due by 05/02/2020)    Time  12    Period  Weeks    Status  New      PT LONG TERM GOAL #2   Title  Pt will demonstrate no falls risk during gait as indicated by score of 30/30 on FGA    Time  12    Period  Weeks    Status  New      PT LONG TERM GOAL #3   Title  Pt will report 50% reduction in swaying sensation when taking a shower, when at work or when sitting still in her car    Time  12    Period  Weeks    Status  New      PT LONG TERM GOAL #4   Title  Pt will demonstrate 10 deg increase in cervical rotation and side bending and report </= 3/10 HA and neck pain on average each week    Baseline  sidebending: 30 deg bilaterally, rotation 60 deg bilaterally  - pain with L sidebending and L rotation    Time  12    Period  Weeks    Status  Revised      PT LONG TERM GOAL #5   Title  Pt will increase FOTO to >/= 79% and DHI will decrease by 18 points overall    Baseline  65%; 56 DHI    Time  12    Period  Weeks  Status  New            Plan - 04/26/20 1912    Clinical Impression Statement  Today was patients last aquatic session.  Pt reports planning on continuing aquatics as part of her HEP at a family  members pool.  Pt continues to report relief from dizziness during pool sessions.  Cont PT per POC.    Comorbidities  migraines, ADHD, generalized anxiety disorder, IBS, B12 deficiency, not currently taking medication to manage migraines - previous medication had too strong side effects    Examination-Activity Limitations  Locomotion Level;Stand;Sit    Examination-Participation Restrictions  Cleaning;Community Activity;Driving;Shop    PT Frequency  1x / week    PT Duration  12 weeks    PT Treatment/Interventions  ADLs/Self Care Home Management;Aquatic Therapy;Canalith Repostioning;Cryotherapy;Moist Heat;Gait training;Stair training;Functional mobility training;Therapeutic activities;Therapeutic exercise;Balance training;Neuromuscular re-education;Patient/family education;Manual techniques;Passive range of motion;Dry needling;Vestibular    PT Next Visit Plan  Audra-can you provide pt a paper copy of her Aquatic HEP from [facility] (see code below).  Review how she is doing weight lifting to see if she is overusing neck mm.  Assess full body neural tension - give nerve glides.  Deep neck flexor strengthening with BP cuff. Progress land HEP as pt is able to tolerate - thoracic spine mobilization, balance - head turns, vision removed, trampoline or physioball?  incorporate aerobic exercise on treadmill - add incline and head turns    PT Home Exercise Plan  Medbridge Access Code: [email or web address] for aquatic HEP    Consulted and Agree with Plan of Care  Patient       Patient will benefit from skilled therapeutic intervention in order to improve the following deficits and impairments:  Dizziness, Pain, Postural dysfunction, Decreased range of motion, Decreased balance, Difficulty walking  Visit Diagnosis: Dizziness and giddiness  Cervicalgia  Unsteadiness on feet     Problem List Patient Active Problem List   Diagnosis Date Noted  . Hypermobility syndrome 03/28/2020  . Neck pain 03/28/2020  .  Dysphagia   . Anterior chest wall pain 08/12/2019  . Dizziness on standing 05/24/2019  . Rapid palpitations 05/24/2019  . Adjustment disorder with anxious mood 04/02/2019  . Initiation of oral contraception 09/21/2018    Narda Bonds, PTA [place] 04/26/20 7:27 PM Phone: [phone] Fax: [place] [address] [place], [place], [address] Phone: [phone]   Fax:  [phone]  Name: Melanie Jordan MRN: [id number] Date of Birth: 1991/01/26

## 2020-04-26 NOTE — Patient Instructions (Signed)
Access Code: [email or web address] URL: [email or web address] Date: 04/26/2020 Prepared by: Nita Sells  Exercises Forward Walking - 2 x weekly - 2 reps Backward Walking - 2 x weekly - 2 reps Side Stepping - 2 x weekly - 2 reps Squat - 2 x weekly - 10 reps Single Leg Squat - 1 x daily - 2 x weekly - 10 reps Standing March at [facility] - 2 x weekly - 10 reps Standing Hip Abduction Adduction at Pool Wall - 2 x weekly - 10 reps Standing Hip Flexion Extension at [employer] - 2 x weekly - 10 reps Soothing - 1 x daily - 2 x weekly - 10 reps Enclosing - 1 x daily - 2 x weekly - 10 reps Freeing - 1 x daily - 2 x weekly - 10 reps Forward and Backward Weight Shift with Stepping at [employer] - 1 x daily - 2 x weekly - 10 reps Heel Toe Raises at Pool Wall - 1 x daily - 2 x weekly - 10 reps Forward Jog in Shallow Water - 1 x daily - 2 x weekly - 2-4 reps

## 2020-04-27 ENCOUNTER — Ambulatory Visit: Payer: 59 | Admitting: Physical Therapy

## 2020-05-01 DIAGNOSIS — Z79899 Other long term (current) drug therapy: Secondary | ICD-10-CM | POA: Diagnosis not present

## 2020-05-01 DIAGNOSIS — G43809 Other migraine, not intractable, without status migrainosus: Secondary | ICD-10-CM | POA: Diagnosis not present

## 2020-05-01 DIAGNOSIS — M5481 Occipital neuralgia: Secondary | ICD-10-CM | POA: Diagnosis not present

## 2020-05-01 DIAGNOSIS — E569 Vitamin deficiency, unspecified: Secondary | ICD-10-CM | POA: Diagnosis not present

## 2020-05-02 ENCOUNTER — Other Ambulatory Visit: Payer: Self-pay

## 2020-05-02 ENCOUNTER — Ambulatory Visit (INDEPENDENT_AMBULATORY_CARE_PROVIDER_SITE_OTHER): Payer: 59 | Admitting: Psychiatry

## 2020-05-02 DIAGNOSIS — F411 Generalized anxiety disorder: Secondary | ICD-10-CM | POA: Diagnosis not present

## 2020-05-02 NOTE — Progress Notes (Signed)
      [facility] Counselor/Therapist Progress Note  Patient ID: Melanie Jordan, MRN: [id number],    Date: 05/02/2020  Time Spent: 60 minutes   11:00am to 12:00noon Treatment Type: Individual Therapy  Reported Symptoms: anxiety increasing, depression, "worry", frustrations  Mental Status Exam:  Appearance:   Casual     Behavior:  Appropriate, Sharing and Motivated  Motor:  Normal  Speech/Language:   Normal Rate  Affect:  anxious, depressed  Mood:  anxious and depressed  Thought process:  goal directed  Thought content:    WNL and some decrease in health anxiety  Sensory/Perceptual disturbances:    WNL  Orientation:  oriented to person, place, time/date, situation, day of week, month of year and year  Attention:  Good  Concentration:  Good  Memory:  WNL  Fund of knowledge:   Good  Insight:    Good  Judgment:   Good  Impulse Control:  Good   Risk Assessment: Danger to Self:  No Self-injurious Behavior: No Danger to Others: No Duty to Warn:no Physical Aggression / Violence:No  Access to Firearms a concern: No  Gang Involvement:No   Subjective: Patient today reporting some increase in general anxiety, some depression and worry.   Interventions: Cognitive Behavioral Therapy and Solution-Oriented/Positive Psychology  Diagnosis:   ICD-10-CM   1. Generalized anxiety disorder  F41.1      Plan of Care: Patient not signing Tx Plan on computer screen due to COVID-19.  Treatment Goals: Goals will remain on tx plan as patient works on strategies to achieve her goals. Progress will be noted each session in "Progress" section of the Plan.  Long term goal: Reduce overall level, frequency, and intensity of the anxiety so that daily functioning is not impaired.  Short term goal: Verbalize an understanding of the role thatanxious/negative/depressivehinking plays in creating fears, excessive worry, and persistent anxiety symptoms.  Strategy: Identify,  challenge, and replace anxious/fearful thoughts and self-talk with positive, realistic, and empowering thought and self-talk that do not support anxiety nor fearfulness.  Progress: Patient in today reporting general anxiety has increased, health anxiety has decreased some, and depression is a little better. Mom may be moving to [place].  Some anger and frustrations expressed with some providers in other  Areas of medicine, as she feels they aren't hearing her as she speaks about her meds and how she is feeling. Work issues especially with 1 person.  Patient is considering taking a different job that would get her off from working weekends and have more time with S.O. Reports an issue not discussed before of "not letting people help me" and having an attitude with them when they do. Today, on 1-10 anxiety scale, she self-rates a"-7."  Anxiety more noticeable today and we reviewed the strategy above of identifying more quickly her anxious thoughts, challenging them, and replacing them with more reality-based, positive, and empowering thoughts and self-talk that do not support anxiety.  Encouraged her in her increased openness today and in maintaining her motivation.  Goal review and progress noted with patient.  Next appt within 2-3 weeks.   Melanie Ace, LCSW

## 2020-05-04 ENCOUNTER — Ambulatory Visit: Payer: 59 | Admitting: Physical Therapy

## 2020-05-04 ENCOUNTER — Other Ambulatory Visit: Payer: Self-pay

## 2020-05-04 DIAGNOSIS — M542 Cervicalgia: Secondary | ICD-10-CM

## 2020-05-04 DIAGNOSIS — G44221 Chronic tension-type headache, intractable: Secondary | ICD-10-CM

## 2020-05-04 DIAGNOSIS — R293 Abnormal posture: Secondary | ICD-10-CM | POA: Diagnosis not present

## 2020-05-04 DIAGNOSIS — R42 Dizziness and giddiness: Secondary | ICD-10-CM | POA: Diagnosis not present

## 2020-05-04 DIAGNOSIS — R2681 Unsteadiness on feet: Secondary | ICD-10-CM | POA: Diagnosis not present

## 2020-05-04 NOTE — Therapy (Signed)
[facility] [address] [place], [place], [address] Phone: [phone]   Fax:  [phone]  Physical Therapy Treatment  Patient Details  Name: Melanie Jordan MRN: [id number] Date of Birth: September 18, 1991 Referring Provider (PT): Ray Church, MD   Encounter Date: 05/04/2020   PT End of Session - 05/04/20 1710    Visit Number 13    Number of Visits 13    Date for PT Re-Evaluation 05/02/20    Authorization Type [employer] - [employer]    PT Start Time 4193    PT Stop Time 1152    PT Time Calculation (min) 47 min    Activity Tolerance Patient tolerated treatment well    Behavior During Therapy [facility] for tasks assessed/performed           Past Medical History:  Diagnosis Date  . ADHD    now off Adderal   . Allergy   . Anxiety   . Chronic head pain    taking Metoprolol  . GERD (gastroesophageal reflux disease)   . IBS (irritable bowel syndrome)   . Migraine     Past Surgical History:  Procedure Laterality Date  . ESOPHAGEAL MANOMETRY N/A 10/13/2019   Procedure: ESOPHAGEAL MANOMETRY (EM);  Surgeon: Mauri Pole, MD;  Location: [facility];  Service: Endoscopy;  Laterality: N/A;    There were no vitals filed for this visit.   Subjective Assessment - 05/04/20 1113    Subjective Printed aquatic HEP from website.  Went to see neurology PA who requested PT do treatment for BPPV and more intense vestibular rehab.  Still feels like the work outs are contributing to neck pain.  Didn't take flexeril this morning, wants to take it when she is at home in case it makes her drowsy.    Pertinent History migraines, ADHD, generalized anxiety disorder, IBS, B12 deficiency    Diagnostic tests cervical MRI, LUE nerve conduction study - all WFL    Patient Stated Goals decrease HA and dizziness    Currently in Pain? Yes    Pain Onset More than a month ago              [facility] PT Assessment - 05/04/20  1125      Assessment   Medical Diagnosis vestibular migraines, L occipital neuralgia    Referring Provider (PT) Ray Church, MD    Onset Date/Surgical Date 09/27/19    Prior Therapy yes at [facility] for vestibular rehab and then transferred to Neuro      Precautions   Precautions Other (comment)    Precaution Comments  migraines, ADHD, generalized anxiety disorder, IBS, B12 deficiency      Home Environment   Living Environment --      Prior Function   Level of Independence Independent      Observation/Other Assessments   Focus on Therapeutic Outcomes (FOTO)  73% function, increased from 65% function    Other Surveys  Dizziness Handicap Inventory (DHI)    Dizziness Handicap Inventory ([place])  32 decreased from 56      ROM / Strength   AROM / PROM / Strength AROM      AROM   Overall AROM  Deficits    AROM Assessment Site Cervical    Cervical Flexion WFL    Cervical Extension WFL    Cervical - Right Side Bend 40    Cervical - Left Side Bend 50    Cervical - Right Rotation 70  Cervical - Left Rotation 60 slight decline      Functional Gait  Assessment   Gait assessed  Yes    Gait Level Surface Walks 20 ft in less than 5.5 sec, no assistive devices, good speed, no evidence for imbalance, normal gait pattern, deviates no more than 6 in outside of the 12 in walkway width.    Change in Gait Speed Able to smoothly change walking speed without loss of balance or gait deviation. Deviate no more than 6 in outside of the 12 in walkway width.    Gait with Horizontal Head Turns Performs head turns smoothly with slight change in gait velocity (eg, minor disruption to smooth gait path), deviates 6-10 in outside 12 in walkway width, or uses an assistive device.    Gait with Vertical Head Turns Performs head turns with no change in gait. Deviates no more than 6 in outside 12 in walkway width.    Gait and Pivot Turn Pivot turns safely within 3 sec and stops quickly with no loss  of balance.    Step Over Obstacle Is able to step over 2 stacked shoe boxes taped together (9 in total height) without changing gait speed. No evidence of imbalance.    Gait with Narrow Base of Support Is able to ambulate for 10 steps heel to toe with no staggering.    Gait with Eyes Closed Walks 20 ft, no assistive devices, good speed, no evidence of imbalance, normal gait pattern, deviates no more than 6 in outside 12 in walkway width. Ambulates 20 ft in less than 7 sec.   dizziness   Ambulating Backwards Walks 20 ft, no assistive devices, good speed, no evidence for imbalance, normal gait    Steps Alternating feet, no rail.    Total Score 29    FGA comment: 29/30               Vestibular Assessment - 05/04/20 1117      Positional Testing   Dix-Hallpike Dix-Hallpike Right;Dix-Hallpike Left    Horizontal Canal Testing Horizontal Canal Right;Horizontal Canal Left      Dix-Hallpike Right   Dix-Hallpike Right Duration 0    Dix-Hallpike Right Symptoms No nystagmus      Dix-Hallpike Left   Dix-Hallpike Left Duration 0    Dix-Hallpike Left Symptoms No nystagmus      Horizontal Canal Right   Horizontal Canal Right Duration 0    Horizontal Canal Right Symptoms Normal      Horizontal Canal Left   Horizontal Canal Left Duration 0    Horizontal Canal Left Symptoms Normal      Positional Sensitivities   Sit to Supine No dizziness    Supine to Left Side Lightheadedness    Supine to Right Side No dizziness    Supine to Sitting Mild dizziness    Right Hallpike No dizziness    Up from Right Hallpike No dizziness    Up from Left Hallpike No dizziness    Rolling Right No dizziness    Rolling Left Lightheadedness                            PT Education - 05/04/20 1654    Education Details progress towards goals, vestibular findings and plan for more visits to address posture and neck tension headaches; will gradually increase vestibular rehab as pt is able to  tolerate with migraines    Person(s) Educated Patient    Methods  Explanation    Comprehension Verbalized understanding            PT Short Term Goals - 03/16/20 1824      PT SHORT TERM GOAL #1   Title Pt will be independent with initial HEP    Baseline have been adjusting HEP as needed    Time 6    Period Weeks    Status Partially Met    Target Date 03/18/20      PT SHORT TERM GOAL #2   Title Pt will participate in assessment of cervical ROM and muscle tension with LTG to be set    Time 6    Period Weeks    Status Achieved    Target Date 03/18/20      PT SHORT TERM GOAL #3   Title Pt will demonstrate decreased falls risk during gait as indicated by 2 point improvement in FGA    Baseline 26/30 - score remained the same    Time 6    Period Weeks    Status Not Met    Target Date 03/18/20      PT SHORT TERM GOAL #4   Title Pt will report decrease in symptoms of dizziness when performing head movements in various directions and when performing quick body turns    Baseline decreased dizziness, does feel off balance with head and body turns    Time 6    Period Weeks    Status Partially Met    Target Date 03/18/20             PT Long Term Goals - 05/04/20 1136      PT LONG TERM GOAL #1   Title Pt will demonstrate independence with final vestibular, balance and aerobic HEP  (LTG due by 05/02/2020)    Time 12    Period Weeks    Status On-going      PT LONG TERM GOAL #2   Title Pt will demonstrate no falls risk during gait as indicated by score of 30/30 on FGA    Baseline 29/30    Time 12    Period Weeks    Status Partially Met      PT LONG TERM GOAL #3   Title Pt will report 50% reduction in swaying sensation when taking a shower, when at work or when sitting still in her car    Baseline still fluctuates, less severe at work and when sitting still in car.  25% reduction.    Time 12    Period Weeks    Status Partially Met      PT LONG TERM GOAL #4   Title Pt  will demonstrate 10 deg increase in cervical rotation and side bending and report </= 3/10 HA and neck pain on average each week    Baseline sidebending: 30 deg bilaterally, rotation 60 deg bilaterally increased to 40 deg sidebending, 70 deg rotation.  HA on average is 5-6/10    Time 12    Period Weeks    Status Partially Met      PT LONG TERM GOAL #5   Title Pt will increase FOTO to >/= 79% and DHI will decrease by 18 points overall    Baseline 65%; 56 DHI > 73% function and DHI: down to 32    Time 12    Period Weeks    Status Partially Met            New goals for recertification:  PT Short Term  Goals - 05/04/20 1748      PT SHORT TERM GOAL #1   Title = LTG           PT Long Term Goals - 05/04/20 1748      PT LONG TERM GOAL #1   Title Pt will demonstrate independence with final vestibular, balance and aerobic HEP    Time 8    Period Weeks    Status Revised    Target Date 07/03/20      PT LONG TERM GOAL #2   Title Pt will demonstrate no falls risk during gait as indicated by score of 30/30 on FGA    Baseline 29/30    Time 8    Period Weeks    Status Revised    Target Date 07/03/20      PT LONG TERM GOAL #3   Title Pt will report 50% reduction in tension HA and swaying sensation when taking a shower, when at work or when sitting still in her car    Baseline still fluctuates, less severe at work and when sitting still in car.  25% reduction.    Time 8    Period Weeks    Status Revised    Target Date 07/03/20      PT LONG TERM GOAL #4   Title Pt will demonstrate 10 deg increase in cervical rotation and side bending and report </= 3/10 HA and neck pain on average each week    Baseline sidebending: 30 deg bilaterally, rotation 60 deg bilaterally increased to 40 deg sidebending, 70 deg rotation.  HA on average is 5-6/10    Time 8    Period Weeks    Status Revised    Target Date 07/03/20      PT LONG TERM GOAL #5   Title Pt will increase FOTO to >/= 79% and DHI  will decrease by 10 more points    Baseline 65%; 56 DHI > 73% function and DHI: down to 32    Time 8    Period Weeks    Status Revised    Target Date 07/03/20              Plan - 05/04/20 1715    Clinical Impression Statement Performed assessment of progress towards LTG.  Pt is making slow but steady progress.  Pt has partially met 4/5 LTG with HEP goal ongoing as HEP continues to be updated based on patient response and tolerance.  Pt is reporting gradual improvement in function and decreased dizziness overall.  She also demonstrates improvement in dynamic balance but continues to experience changes in balance with head turns during ambulation.  Pt continues to experience sensation of movement when sitting/standing still but is less than at evaluation.  Performed reassessment of vestibular system - pt is negative for positional vertigo and nystagmus and presents with visual motion sensitivity consistent with vestibular migraines but also continues to experience tension type headaches in addition to migraines.  Pt has completed aquatic therapy but would benefit from further physical therapy services to address posture, occipital neuralgia, neck tension and continue with vestibular rehab as pt is able to tolerate when migraines are more controlled.  Unable to tolerate aggressive vestibular rehab at this time due to migraines being unmanaged.    Comorbidities migraines, ADHD, generalized anxiety disorder, IBS, B12 deficiency, not currently taking medication to manage migraines - previous medication had too strong side effects    Examination-Activity Limitations Locomotion Level;Stand;Sit    Examination-Participation Restrictions  Cleaning;Community Activity;Driving;Shop    PT Frequency 1x / week    PT Duration 8 weeks    PT Treatment/Interventions ADLs/Self Care Home Management;Aquatic Therapy;Canalith Repostioning;Cryotherapy;Moist Heat;Gait training;Stair training;Functional mobility  training;Therapeutic activities;Therapeutic exercise;Balance training;Neuromuscular re-education;Patient/family education;Manual techniques;Passive range of motion;Dry needling;Vestibular    PT Next Visit Plan PLAN IS FOR 6 MORE LAND TO ADDRESS POSTURE/NECK TENSION/BALANCE - VESTIBULAR IF MIGRAINE MEDICATION APPROVED, IF NOT WILL GO ON HOLD UNTIL HA ARE MANAGED.  Review how she is doing weight lifting to see if she is overusing neck mm.  Assess full body neural tension - give nerve glides.  Deep neck flexor strengthening with BP cuff. Progress land HEP as pt is able to tolerate - thoracic spine mobilization, balance - head turns, vision removed, trampoline or physioball?  incorporate aerobic exercise on treadmill - add incline and head turns    PT Home Exercise Plan Medbridge Access Code: [email or web address] for aquatic HEP    Consulted and Agree with Plan of Care Patient           Patient will benefit from skilled therapeutic intervention in order to improve the following deficits and impairments:  Dizziness, Pain, Postural dysfunction, Decreased range of motion, Decreased balance, Difficulty walking  Visit Diagnosis: Dizziness and giddiness  Cervicalgia  Unsteadiness on feet  Chronic tension-type headache, intractable  Abnormal posture     Problem List Patient Active Problem List   Diagnosis Date Noted  . Hypermobility syndrome 03/28/2020  . Neck pain 03/28/2020  . Dysphagia   . Anterior chest wall pain 08/12/2019  . Dizziness on standing 05/24/2019  . Rapid palpitations 05/24/2019  . Adjustment disorder with anxious mood 04/02/2019  . Initiation of oral contraception 09/21/2018    Rico Junker, PT, DPT 05/04/20    5:47 PM    [facility] [address] [place], [place], [address] Phone: [phone]   Fax:  [phone]  Name: JAKIRA MCFADDEN MRN: [id number] Date of Birth: Sep 01, 1991

## 2020-05-08 DIAGNOSIS — J309 Allergic rhinitis, unspecified: Secondary | ICD-10-CM | POA: Diagnosis not present

## 2020-05-08 DIAGNOSIS — H93293 Other abnormal auditory perceptions, bilateral: Secondary | ICD-10-CM | POA: Diagnosis not present

## 2020-05-08 DIAGNOSIS — H6983 Other specified disorders of Eustachian tube, bilateral: Secondary | ICD-10-CM | POA: Diagnosis not present

## 2020-05-11 ENCOUNTER — Other Ambulatory Visit: Payer: Self-pay

## 2020-05-11 ENCOUNTER — Ambulatory Visit: Payer: 59 | Admitting: Physical Therapy

## 2020-05-11 ENCOUNTER — Encounter: Payer: Self-pay | Admitting: Physical Therapy

## 2020-05-11 DIAGNOSIS — G44221 Chronic tension-type headache, intractable: Secondary | ICD-10-CM

## 2020-05-11 DIAGNOSIS — R293 Abnormal posture: Secondary | ICD-10-CM

## 2020-05-11 DIAGNOSIS — R42 Dizziness and giddiness: Secondary | ICD-10-CM | POA: Diagnosis not present

## 2020-05-11 DIAGNOSIS — M542 Cervicalgia: Secondary | ICD-10-CM

## 2020-05-11 DIAGNOSIS — R2681 Unsteadiness on feet: Secondary | ICD-10-CM

## 2020-05-11 MED FILL — COTEMPLA XR-ODT 17.3 MG TAB: 17.3 | 30 days supply | Qty: 30 | Fill #0

## 2020-05-11 NOTE — Therapy (Signed)
[facility] [address] [place], [place], [address] Phone: [phone]   Fax:  [phone]  Physical Therapy Treatment  Patient Details  Name: Melanie Jordan MRN: [id number] Date of Birth: 13-Nov-1991 Referring Provider (PT): Ray Church, MD   Encounter Date: 05/11/2020   PT End of Session - 05/11/20 1016    Visit Number 14    Number of Visits 19    Date for PT Re-Evaluation 07/03/20    Authorization Type [employer] Employee - [employer]    PT Start Time 0930    PT Stop Time 1013    PT Time Calculation (min) 43 min    Activity Tolerance Patient tolerated treatment well    Behavior During Therapy [facility] for tasks assessed/performed           Past Medical History:  Diagnosis Date  . ADHD    now off Adderal   . Allergy   . Anxiety   . Chronic head pain    taking Metoprolol  . GERD (gastroesophageal reflux disease)   . IBS (irritable bowel syndrome)   . Migraine     Past Surgical History:  Procedure Laterality Date  . ESOPHAGEAL MANOMETRY N/A 10/13/2019   Procedure: ESOPHAGEAL MANOMETRY (EM);  Surgeon: Mauri Pole, MD;  Location: [facility];  Service: Endoscopy;  Laterality: N/A;    There were no vitals filed for this visit.   Subjective Assessment - 05/11/20 0935    Subjective Had migraine injection on Monday so feeling a little better after that, no headache but is having L TMJ issues.  Hasn't tried the flexeril yet.  Off and on dizziness by end of day.    Pertinent History migraines, ADHD, generalized anxiety disorder, IBS, B12 deficiency    Diagnostic tests cervical MRI, LUE nerve conduction study - all WFL    Patient Stated Goals decrease HA and dizziness    Currently in Pain? No/denies    Pain Onset More than a month ago                             [facility] Adult PT Treatment/Exercise - 05/11/20 0940      Exercises   Exercises Other Exercises    Other  Exercises  Pt described exercises on [facility] Body that give her the most difficulty and cause the most tension in shoulders.  Pt reports push ups, planks and lifting weight overhead.  Discussed ways to modify push up and plank position with more upright posture - UE on table, chair or even wall.  When attempting push up using arm rests pt noted to have significant winging in scapula.  Returned to mat for serratus anterior training.  In supine performed serratus anterior raises (scapular protraction) without weight and then holding 2lb weight, 1 set x 10 without weight, 2 sets x 10 with weight on L and R side.  Also performed modified side plank on elbow, knees bent and hips down on mat with primary focus on activating obliques and serratus to stabilize scapula and maintaining x 10 seconds, 3 reps on L and R side.  Transitioned to the wall and performed scapula stabilization with incline lean into wall (wall plank) and maintaining with cues to keep elbows soft to avoid hyperextension.  Once pt able to stabilize scapula performed partial wall push up stopping before pt collapsed into shoulders.  Reviewed squat with overhead press - during squat pt noted to  over extended cervical spine - focused on keeping head/neck neutral during squat to maintain core activation and then when standing and raising weights overhead performing exhale for core/proximal stability to prevent overuse of upper trap to raise weights.  Performed x 5; advised pt to try at home keeping tennis ball between chin and chest to keep neck neutral.  Main themes reviewed with patient - use of modifications to focus on scapular stability prior to increasing challenge with gravity, keeping neck in neutral and use of breathing for core activation.                  PT Education - 05/11/20 1016    Education Details See exercises - advised pt to perform serratus punches in supine with 2lb weight, modified side plank and wall plank/push up for  stabilization training    Person(s) Educated Patient    Methods Explanation;Demonstration    Comprehension Verbalized understanding;Returned demonstration            PT Short Term Goals - 05/04/20 1748      PT SHORT TERM GOAL #1   Title = LTG             PT Long Term Goals - 05/04/20 1748      PT LONG TERM GOAL #1   Title Pt will demonstrate independence with final vestibular, balance and aerobic HEP    Time 8    Period Weeks    Status Revised    Target Date 07/03/20      PT LONG TERM GOAL #2   Title Pt will demonstrate no falls risk during gait as indicated by score of 30/30 on FGA    Baseline 29/30    Time 8    Period Weeks    Status Revised    Target Date 07/03/20      PT LONG TERM GOAL #3   Title Pt will report 50% reduction in tension HA and swaying sensation when taking a shower, when at work or when sitting still in her car    Baseline still fluctuates, less severe at work and when sitting still in car.  25% reduction.    Time 8    Period Weeks    Status Revised    Target Date 07/03/20      PT LONG TERM GOAL #4   Title Pt will demonstrate 10 deg increase in cervical rotation and side bending and report </= 3/10 HA and neck pain on average each week    Baseline sidebending: 30 deg bilaterally, rotation 60 deg bilaterally increased to 40 deg sidebending, 70 deg rotation.  HA on average is 5-6/10    Time 8    Period Weeks    Status Revised    Target Date 07/03/20      PT LONG TERM GOAL #5   Title Pt will increase FOTO to >/= 79% and DHI will decrease by 10 more points    Baseline 65%; 56 DHI > 73% function and DHI: down to 32    Time 8    Period Weeks    Status Revised    Target Date 07/03/20                 Plan - 05/11/20 1136    Clinical Impression Statement Performed assessment of technique Beach Body and weight lifting exercises and noted decreased scapular stability and core activation.  Focused on activation of serratus anterior and  modifications for scapular stability before increasing challenge.  Pt tolerated well with no increase in headache after performing exercises.  Will continue to address and progress towards LTG.    Comorbidities migraines, ADHD, generalized anxiety disorder, IBS, B12 deficiency, not currently taking medication to manage migraines - previous medication had too strong side effects    Examination-Activity Limitations Locomotion Level;Stand;Sit    Examination-Participation Restrictions Cleaning;Community Activity;Driving;Shop    PT Frequency 1x / week    PT Duration 8 weeks    PT Treatment/Interventions ADLs/Self Care Home Management;Aquatic Therapy;Canalith Repostioning;Cryotherapy;Moist Heat;Gait training;Stair training;Functional mobility training;Therapeutic activities;Therapeutic exercise;Balance training;Neuromuscular re-education;Patient/family education;Manual techniques;Passive range of motion;Dry needling;Vestibular    PT Next Visit Plan PLAN IS FOR 6 MORE LAND TO ADDRESS POSTURE/NECK TENSION/BALANCE - VESTIBULAR IF MIGRAINE MEDICATION APPROVED, IF NOT WILL GO ON HOLD UNTIL HA ARE MANAGED.  How are exercise modifications working?  Keep working on scapular stability/serratus, modified wall plank, push up, side planks on elbow on mat.  Assess full body neural tension - give nerve glides.  Deep neck flexor strengthening with BP cuff. Progress land HEP as pt is able to tolerate - thoracic spine mobilization, balance - head turns, vision removed, trampoline or physioball?  incorporate aerobic exercise on treadmill - add incline and head turns    PT Home Exercise Plan Medbridge Access Code: [email or web address] for aquatic HEP    Consulted and Agree with Plan of Care Patient           Patient will benefit from skilled therapeutic intervention in order to improve the following deficits and impairments:  Dizziness, Pain, Postural dysfunction, Decreased range of motion, Decreased balance, Difficulty walking  Visit  Diagnosis: Cervicalgia  Dizziness and giddiness  Unsteadiness on feet  Chronic tension-type headache, intractable  Abnormal posture     Problem List Patient Active Problem List   Diagnosis Date Noted  . Hypermobility syndrome 03/28/2020  . Neck pain 03/28/2020  . Dysphagia   . Anterior chest wall pain 08/12/2019  . Dizziness on standing 05/24/2019  . Rapid palpitations 05/24/2019  . Adjustment disorder with anxious mood 04/02/2019  . Initiation of oral contraception 09/21/2018    Rico Junker, PT, DPT 05/11/20    11:40 AM    [facility] [address] [place], [place], [address] Phone: [phone]   Fax:  [phone]  Name: Melanie Jordan MRN: [id number] Date of Birth: 13-Jan-1991

## 2020-05-16 ENCOUNTER — Other Ambulatory Visit: Payer: Self-pay

## 2020-05-16 ENCOUNTER — Ambulatory Visit (INDEPENDENT_AMBULATORY_CARE_PROVIDER_SITE_OTHER): Payer: 59 | Admitting: Psychiatry

## 2020-05-16 DIAGNOSIS — F411 Generalized anxiety disorder: Secondary | ICD-10-CM | POA: Diagnosis not present

## 2020-05-16 NOTE — Progress Notes (Signed)
[facility] Counselor/Therapist Progress Note  Patient ID: Melanie Jordan, MRN: [id number],    Date: 05/16/2020  Time Spent: 60 minutes  10:00am to 11:00am   Treatment Type: Individual Therapy  Reported Symptoms: anxiety "increased some recently"  Mental Status Exam:  Appearance:   Casual     Behavior:  Appropriate  Motor:  Normal  Speech/Language:   Normal Rate  Affect:  anxious  Mood:  anxious  Thought process:  goal directed  Thought content:    some obsessiveness re: medical issues  Sensory/Perceptual disturbances:    WNL  Orientation:  oriented to person, place, time/date, situation, day of week, month of year and year  Attention:  Good  Concentration:  Good  Memory:  WNL  Fund of knowledge:   Good  Insight:    Good  Judgment:   Good  Impulse Control:  Good   Risk Assessment: Danger to Self:  No Self-injurious Behavior: No Danger to Others: No Duty to Warn:no Physical Aggression / Violence:No  Access to Firearms a concern: No  Gang Involvement:No   Subjective: Patient reports today symptoms of anxiety which have increased more recently.  Interventions: Cognitive Behavioral Therapy, Solution-Oriented/Positive Psychology and Ego-Supportive  Diagnosis:   ICD-10-CM   1. Generalized anxiety disorder  F41.1      Plan of Care: Patient not signing Tx Plan on computer screen due to COVID-19.  Treatment Goals: Goals will remain on tx plan as patient works on strategies to achieve her goals. Progress will be noted each session in "Progress" section of the Plan.  Long term goal: Reduce overall level, frequency, and intensity of the anxiety so that daily functioning is not impaired.  Short term goal: Verbalize an understanding of the role thatanxious/negative/depressivehinking plays in creating fears, excessive worry, and persistent anxiety symptoms.  Strategy: Identify, challenge, and replace anxious/fearful thoughts and self-talk with  positive, realistic, and empowering thought and self-talk that do not support anxiety nor fearfulness.  Progress: Patient today reporting some increased anxiety more recently but not sure why.  Has noticed some heightened anxiety at work, and feels other coworkers judge her "like they do other people." She reports it's obvious she does not participate when coworkers are talking badly about others, and that seems to feed her her anxiety and feelings that others judge her.  Discussed this and patient was able to at least begin realizing her thoughts may not be accurate and even if they were, "I can't control what others think or say, but I know I need to work on this." Is considering a work change from Friday-Sunday to Mon-Wed as that would help her have more time with husband on weekends. States that her "health anxiety has actually improved some even though health issues are still present."  Looked at some of her progress in working on anxiety ad reports today is a "3-4" on anxiety scale and last visit she was a "7".  Admits it can depend on what all she "faces each day and how much anxiety gets stirred up." Continues to work on strategies we've discussed in session but is starting to notice more benefit.  "Staying in the moment more and reading is helping also."  Showing some increased insight into her anxious thoughts and having some success in efforts to better manage them.  Good motivation.  Goal review and progress noted with patient.  Next appt within 2-3 weeks.   Shanon Ace, LCSW

## 2020-05-22 ENCOUNTER — Other Ambulatory Visit: Payer: Self-pay

## 2020-05-22 ENCOUNTER — Ambulatory Visit: Payer: 59 | Admitting: Physical Therapy

## 2020-05-22 ENCOUNTER — Encounter: Payer: Self-pay | Admitting: Physical Therapy

## 2020-05-22 DIAGNOSIS — M542 Cervicalgia: Secondary | ICD-10-CM | POA: Diagnosis not present

## 2020-05-22 DIAGNOSIS — R2681 Unsteadiness on feet: Secondary | ICD-10-CM | POA: Diagnosis not present

## 2020-05-22 DIAGNOSIS — R42 Dizziness and giddiness: Secondary | ICD-10-CM | POA: Diagnosis not present

## 2020-05-22 DIAGNOSIS — R293 Abnormal posture: Secondary | ICD-10-CM | POA: Diagnosis not present

## 2020-05-22 DIAGNOSIS — G44221 Chronic tension-type headache, intractable: Secondary | ICD-10-CM | POA: Diagnosis not present

## 2020-05-22 NOTE — Therapy (Signed)
[facility] [address] [place], [place], [address] Phone: [phone]   Fax:  [phone]  Physical Therapy Treatment  Patient Details  Name: Melanie Jordan MRN: [id number] Date of Birth: October 29, 1991 Referring Provider (PT): Ray Church, MD   Encounter Date: 05/22/2020   PT End of Session - 05/22/20 2014    Visit Number 15    Number of Visits 19    Date for PT Re-Evaluation 07/03/20    Authorization Type [employer] Employee - U[employer]    PT Start Time [phone]    PT Stop Time 0930    PT Time Calculation (min) 44 min    Activity Tolerance Patient limited by pain   limited by c/o Lt shoulder pain today due to moving heavy pt at work   Behavior During Therapy [facility] for tasks assessed/performed           Past Medical History:  Diagnosis Date   ADHD    now off Adderal    Allergy    Anxiety    Chronic head pain    taking Metoprolol   GERD (gastroesophageal reflux disease)    IBS (irritable bowel syndrome)    Migraine     Past Surgical History:  Procedure Laterality Date   ESOPHAGEAL MANOMETRY N/A 10/13/2019   Procedure: ESOPHAGEAL MANOMETRY (EM);  Surgeon: Mauri Pole, MD;  Location: [facility];  Service: Endoscopy;  Laterality: N/A;    There were no vitals filed for this visit.   Subjective Assessment - 05/22/20 0848    Subjective Pt states her Lt TMJ is bothering her today - has been going on  for about a week; continues to c/o dizziness daily, but says the medicine for migraines is helping some; pt states insurance will not pay for the amount of migraine medication she needs - states she can only get 8/month  and needs 15 as she needs to take it every other day.  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 Yes    Pain Score 2     Pain Location Shoulder    Pain Orientation Left    Pain Descriptors / Indicators Discomfort;Sore    Pain Type Acute pain    Pain Onset In the past 7 days    Pain Frequency Intermittent                             [facility] Adult PT Treatment/Exercise - 05/22/20 0857      Neck Exercises: Standing   Neck Retraction 10 reps;3 secs   in partial squat position against wall   Wall Push Ups 20 reps   2 sets 10 reps      Neck Exercises: Supine   Neck Retraction 10 reps;5 secs      Neck Exercises: Prone   Axial Exension 10 reps   lifting head and shoulders off mat table- 3 sec hold   W Back 10 reps      Shoulder Exercises: Supine   Protraction AROM;Strengthening;Both;10 reps   2 sets ; 1st set no weight; 2nd set 2#  weight   Protraction Weight (lbs) 2      Shoulder Exercises: Prone   Retraction AROM;Both;10 reps    Extension AROM;Both;10 reps           TherEx:  Pt performed scapular retraction - seated on blue physioball - holding 2# weight in each hand- 5 reps only due to c/o Lt shoulder pain (ex. Was stopped)  Pt performed seated scapular retraction with red theraband - arms flexed to 90 degrees shoulder flexion with elbows straight; 10 reps; elbows flexed - pulling back (shoulder blade squeezes 5 sec hold 10 reps)  UBE level 1.5 - 2" forward, 2" backward  Triceps dips off hi/lo mat table - 2 sets 5 reps           PT Short Term Goals - 05/22/20 2020      PT SHORT TERM GOAL #1   Title = LTG             PT Long Term Goals - 05/22/20 2020      PT LONG TERM GOAL #1   Title Pt will demonstrate independence with final vestibular, balance and aerobic HEP    Time 8    Period Weeks    Status Revised      PT LONG TERM GOAL #2   Title Pt will demonstrate no falls risk during gait as  indicated by score of 30/30 on FGA    Baseline 29/30    Time 8    Period Weeks    Status Revised      PT LONG TERM GOAL #3   Title Pt will report 50% reduction in tension HA and swaying sensation when taking a shower, when at work or when sitting still in her car    Baseline still fluctuates, less severe at work and when sitting still in car.  25% reduction.    Time 8    Period Weeks    Status Revised      PT LONG TERM GOAL #4   Title Pt will demonstrate 10 deg increase in cervical rotation and side bending and report </= 3/10 HA and neck pain on average each week    Baseline sidebending: 30 deg bilaterally, rotation 60 deg bilaterally increased to 40 deg sidebending, 70 deg rotation.  HA on average is 5-6/10    Time 8    Period Weeks    Status Revised      PT LONG TERM GOAL #5   Title Pt will increase FOTO to >/= 79% and DHI will decrease by 10 more points    Baseline 65%; 56 DHI > 73% function and DHI: down to 32    Time 8    Period Weeks    Status Revised                 Plan - 05/22/20 2016    Clinical Impression Statement Session focused on exercises for cervical and scapular strengthening; pt limited by c/o Lt shoulder pain due to having assisted in moving a very large patient at work over the weekend.  Pt declined attempting planks due to possibility of aggravating back pain.  Session was modified based on c/o pain.  Work activities appear to be impacting function and pain.    Comorbidities migraines, ADHD, generalized anxiety disorder, IBS, B12 deficiency, not currently taking medication to manage migraines - previous medication had too strong side effects    Examination-Activity Limitations Locomotion Level;Stand;Sit    Examination-Participation Restrictions Cleaning;Community Activity;Driving;Shop  PT Frequency 1x / week    PT Duration 8 weeks    PT Treatment/Interventions ADLs/Self Care Home Management;Aquatic Therapy;Canalith Repostioning;Cryotherapy;Moist  Heat;Gait training;Stair training;Functional mobility training;Therapeutic activities;Therapeutic exercise;Balance training;Neuromuscular re-education;Patient/family education;Manual techniques;Passive range of motion;Dry needling;Vestibular    PT Next Visit Plan PLAN IS FOR 6 MORE LAND TO ADDRESS POSTURE/NECK TENSION/BALANCE - VESTIBULAR IF MIGRAINE MEDICATION APPROVED, IF NOT WILL GO ON HOLD UNTIL HA ARE MANAGED.  How are exercise modifications working?  Keep working on scapular stability/serratus, modified wall plank, push up, side planks on elbow on mat.  Assess full body neural tension - give nerve glides.  Deep neck flexor strengthening with BP cuff. Progress land HEP as pt is able to tolerate - thoracic spine mobilization, balance - head turns, vision removed, trampoline or physioball?  incorporate aerobic exercise on treadmill - add incline and head turns    PT Home Exercise Plan Medbridge Access Code: [email or web address] for aquatic HEP    Consulted and Agree with Plan of Care Patient           Patient will benefit from skilled therapeutic intervention in order to improve the following deficits and impairments:  Dizziness, Pain, Postural dysfunction, Decreased range of motion, Decreased balance, Difficulty walking  Visit Diagnosis: Cervicalgia     Problem List Patient Active Problem List   Diagnosis Date Noted   Hypermobility syndrome 03/28/2020   Neck pain 03/28/2020   Dysphagia    Anterior chest wall pain 08/12/2019   Dizziness on standing 05/24/2019   Rapid palpitations 05/24/2019   Adjustment disorder with anxious mood 04/02/2019   Initiation of oral contraception 09/21/2018    Alda Lea, PT 05/22/2020, 8:21 PM  [facility] [address] [place], [place], [address] Phone: [phone]   Fax:  [phone]  Name: Melanie Jordan MRN: [id number] Date of Birth: Oct 15, 1991

## 2020-05-23 ENCOUNTER — Ambulatory Visit: Payer: 59 | Admitting: Family Medicine

## 2020-05-30 ENCOUNTER — Ambulatory Visit (INDEPENDENT_AMBULATORY_CARE_PROVIDER_SITE_OTHER): Payer: 59 | Admitting: Psychiatry

## 2020-05-30 ENCOUNTER — Other Ambulatory Visit: Payer: Self-pay

## 2020-05-30 DIAGNOSIS — F411 Generalized anxiety disorder: Secondary | ICD-10-CM | POA: Diagnosis not present

## 2020-05-30 NOTE — Progress Notes (Signed)
      [facility] Counselor/Therapist Progress Note  Patient ID: Melanie Jordan, MRN: [id number],    Date: 05/30/2020  Time Spent: 60 minutes   11:00am to 12:00noon   Treatment Type: Individual Therapy  Reported Symptoms:  Anxiety (generalized and health-related)  Mental Status Exam:  Appearance:   Casual     Behavior:  Appropriate, Sharing and Motivated  Motor:  Normal  Speech/Language:   Clear and Coherent  Affect:  anxious  Mood:  anxious  Thought process:  normal  Thought content:    WNL  Sensory/Perceptual disturbances:    WNL  Orientation:  oriented to person, place, time/date, situation, day of week, month of year and year  Attention:  Good  Concentration:  Good and Fair  Memory:  [place] of knowledge:   Good  Insight:    Fair  Judgment:   Fair  Impulse Control:  Fair and some unncessary spending   Risk Assessment: Danger to Self:  No Self-injurious Behavior: No Danger to Others: No Duty to Warn:no Physical Aggression / Violence:No  Access to Firearms a concern: No  Gang Involvement:No   Subjective: Patient today reports anxiety although it's not been quite as bad in past 2 weeks since last appt.  Interventions: Cognitive Behavioral Therapy and Solution-Oriented/Positive Psychology  Diagnosis:   ICD-10-CM   1. Generalized anxiety disorder  F41.1      Plan of Care: Patient not signing Tx Plan on computer screen due to COVID-19.  Treatment Goals: Goals will remain on tx plan as patient works on strategies to achieve her goals. Progress will be noted each session in "Progress" section of the Plan.  Long term goal: Reduce overall level, frequency, and intensity of the anxiety so that daily functioning is not impaired.  Short term goal: Verbalize an understanding of the role thatanxious/negative/depressivehinking plays in creating fears, excessive worry, and persistent anxiety symptoms.  Strategy: Identify, challenge, and  replaceanxious/fearfulthoughts andself-talk with positive, realistic, and empoweringthought andself-talkthat do not support anxiety nor fearfulness.  Progress: Patient in today reporting anxiety continues but has been a little reduced past couple weeks. Has been more obsessed with reading book series and reports it's getting in the way of getting home chores done and also in relationship with long time BF, especially when he's been drinking. .  Reading had helped until it began to be obsessive and interfered with home and other places.  Had some additional struggles with the issue of "others talking about me at work" which was discussed last session.  Has had more experience of this happening recently and processed it today as well as how to better handle those situations. Some health anxiety recently but recovered more quickly with some realistic, positive self-talk. Rates self as a "5" today on anxiety scale o 1-10. Is recognizably working more to manage her anxiety than feeling that there's nothing she can do to impact it. Encouraged her to focus on the present versus past or too far into future as that creates more worries for her.  Also to focus on what she can control versus can't control.  Motivation is improving.   Goal review and progress noted with patient.  Next appt within 2 weeks.   Shanon Ace, LCSW

## 2020-06-01 ENCOUNTER — Encounter: Payer: Self-pay | Admitting: Physical Therapy

## 2020-06-01 ENCOUNTER — Ambulatory Visit: Payer: 59 | Attending: Neurology | Admitting: Physical Therapy

## 2020-06-01 ENCOUNTER — Other Ambulatory Visit: Payer: Self-pay

## 2020-06-01 DIAGNOSIS — R42 Dizziness and giddiness: Secondary | ICD-10-CM | POA: Diagnosis not present

## 2020-06-01 DIAGNOSIS — G44221 Chronic tension-type headache, intractable: Secondary | ICD-10-CM | POA: Insufficient documentation

## 2020-06-01 DIAGNOSIS — M542 Cervicalgia: Secondary | ICD-10-CM | POA: Diagnosis not present

## 2020-06-01 DIAGNOSIS — R293 Abnormal posture: Secondary | ICD-10-CM | POA: Diagnosis not present

## 2020-06-01 DIAGNOSIS — R2681 Unsteadiness on feet: Secondary | ICD-10-CM | POA: Insufficient documentation

## 2020-06-01 NOTE — Therapy (Signed)
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 ADHD    now off Adderal   . Allergy   . Anxiety   . Chronic head pain    taking Metoprolol  . GERD (gastroesophageal reflux disease)   . IBS (irritable bowel syndrome)   . Migraine     Past Surgical History:  Procedure Laterality Date  . ESOPHAGEAL MANOMETRY N/A 10/13/2019   Procedure: ESOPHAGEAL MANOMETRY (EM);  Surgeon: Mauri Pole, MD;  Location: [facility];  Service: Endoscopy;  Laterality: N/A;    There were no vitals filed for this visit.   Subjective Assessment - 06/01/20 0938    Subjective Shoulder is feeling better.  Is taking migraine medication as needed and that is working well.  Had a painful migraine with dizziness/lightheadedness and the medication helped stop the migraine without any side effects, was able to go the pool.  Asking about treatment for TMJ that doesn't involve dry needling.    Pertinent History migraines, ADHD, generalized anxiety disorder, IBS, B12 deficiency    Diagnostic tests cervical MRI, LUE nerve conduction study - all WFL    Patient Stated Goals decrease HA and dizziness    Currently in Pain? No/denies    Pain Onset In the past 7 days            Use Glove and  perform trigger point release to muscle in jaw x 30 seconds 2-3x/day   Rocabado's 6x6 exercise program for TMJ  1/2. Relaxation    Part 1: Tongue Clucks: Find normal resting position = holding 1/3 of tongue gently against roof of mouth just behind the front teeth.  Perform belly breathing (keep chest and collar bone relaxed/down) through nose while tongue is in resting position x 6 slow breaths.  Part 2: Controlled TMJ rotation on opening: Place tongue on roof of mouth. Slowly open mouth with tongue on the roof, 6 reps slowly.  3. Mandibular rhythmic stabilization: Closing: Isometric Resisted    Tongue on roof of mouth.  Place two fingers on chin and resist clenching teeth together. Hold _6___ seconds. Relax. Repeat __6__ times per set.  Opening: Isometric    Tongue on roof of mouth.  Place fist below jaw. Resist downward movement of chin. Hold __6__ seconds. Relax. Repeat __6__ times per set.   Lateral Glide: Isometric    Tongue on roof of mouth.  Place two fingers on left side of jaw. Resist movement of jaw to same side. Hold __6__ seconds. Relax. Repeat __6__ times per set.   Repeat on right side of jaw.  Hold 6 seconds, repeat 6 times.   4. Stabilized Head Flexion/Upper Cervical Flexion / Extension  Stabilize lower neck with towel.  Gently flex and extend upper neck by nodding head.  Repeat __6_ times per set.    5. Lower Cervical Retraction     Sit in a supportive chair with pillow behind head.  Pull head straight back, keeping eyes and jaw level. Repeat __6__ times per set.   6. Shoulder (Scapula) Retraction    Pull shoulders back, squeezing shoulder blades together. Repeat __6__ times per session.  .    PT Education - 06/01/20 1350    Education Details use of rose colored glasses for light sensitivity with migraines; educated on Rocabado's 6x6 exercise program for TMJ and self trigger point release.    Person(s) Educated Patient    Methods  Explanation;Demonstration    Comprehension Verbalized understanding;Returned demonstration            PT Short Term Goals - 05/22/20 2020      PT SHORT TERM GOAL #1   Title = LTG             PT Long Term Goals - 05/22/20 2020      PT LONG TERM GOAL #1   Title Pt will demonstrate independence with final vestibular, balance and aerobic HEP    Time 8    Period Weeks    Status Revised      PT LONG TERM GOAL #2   Title Pt will demonstrate no falls risk during gait as indicated by score of 30/30 on FGA    Baseline 29/30    Time 8    Period Weeks    Status Revised      PT LONG TERM GOAL #3   Title Pt will report 50% reduction in tension HA and swaying sensation when taking a shower, when at work or when sitting still in her car    Baseline still fluctuates, less severe at work and when sitting still in car.  25% reduction.    Time 8    Period Weeks    Status Revised      PT LONG TERM GOAL #4   Title Pt will demonstrate 10 deg increase in cervical rotation and side bending and report </= 3/10 HA and neck pain on average each week    Baseline sidebending: 30 deg bilaterally, rotation 60 deg bilaterally increased to 40 deg sidebending, 70 deg rotation.  HA on average is 5-6/10    Time 8    Period Weeks    Status Revised      PT LONG TERM GOAL #5   Title Pt will increase FOTO to >/= 79% and DHI will decrease by 10 more points    Baseline 65%; 56 DHI > 73% function and DHI: down to 32    Time 8    Period Weeks    Status Revised                 Plan - 06/01/20 1352    Clinical Impression Statement Pt reporting improvement in vestibular symptoms with new medication and improvement in shoulder.  Pt does report increased tension and pain in R TMJ; reviewed how to perform 6x6 TMJ exercises and self trigger point release.  Pt tolerated exercises well.  Pt requested to perform re-assessment of vestibular system now that migraines are well managed and restart vestibular  training due to benefit in the past.  Will re-assess at next visit.    Comorbidities migraines, ADHD, generalized anxiety disorder, IBS, B12 deficiency, not currently taking medication to manage migraines -  previous medication had too strong side effects    Examination-Activity Limitations Locomotion Level;Stand;Sit    Examination-Participation Restrictions Cleaning;Community Activity;Driving;Shop    PT Frequency 1x / week    PT Duration 8 weeks    PT Treatment/Interventions ADLs/Self Care Home Management;Aquatic Therapy;Canalith Repostioning;Cryotherapy;Moist Heat;Gait training;Stair training;Functional mobility training;Therapeutic activities;Therapeutic exercise;Balance training;Neuromuscular re-education;Patient/family education;Manual techniques;Passive range of motion;Dry needling;Vestibular    PT Next Visit Plan How is TMJ?  Perform re-assessment of vestibular system and restart vestibular exercises as indicated.  Keep working on scapular stability/serratus, modified wall plank, push up, side planks on elbow on mat.  Deep neck flexor strengthening with BP cuff. Progress land HEP as pt is able to tolerate - thoracic spine mobilization, balance - head turns, vision removed, trampoline or physioball?  incorporate aerobic exercise on treadmill - add incline and head turns    PT Home Exercise Plan Medbridge Access Code: [email or web address] for aquatic HEP    Consulted and Agree with Plan of Care Patient           Patient will benefit from skilled therapeutic intervention in order to improve the following deficits and impairments:  Dizziness, Pain, Postural dysfunction, Decreased range of motion, Decreased balance, Difficulty walking  Visit Diagnosis: Cervicalgia  Chronic tension-type headache, intractable  Abnormal posture     Problem List Patient Active Problem List   Diagnosis Date Noted  . Hypermobility syndrome 03/28/2020  . Neck pain 03/28/2020  . Dysphagia   . Anterior chest wall pain  08/12/2019  . Dizziness on standing 05/24/2019  . Rapid palpitations 05/24/2019  . Adjustment disorder with anxious mood 04/02/2019  . Initiation of oral contraception 09/21/2018    Rico Junker, PT, DPT 06/01/20    2:00 PM    [facility] [address] [place], [place], [address] Phone: [phone]   Fax:  [phone]  Name: Melanie Jordan MRN: [id number] Date of Birth: [birth year]

## 2020-06-01 NOTE — Patient Instructions (Addendum)
Access Code: [email or web address] URL: [email or web address] Date: 03/16/2020 Prepared by: Misty Stanley  Exercises Seated Cervical Sidebending Stretch - 1 x daily - 7 x weekly - 2 sets - 30 second hold Standing Pec Stretch at [place] - 1 x daily - 7 x weekly - 3 sets - 20 second hold Supine Cervical Retraction with Towel - 1 x daily - 7 x weekly - 2 sets - 10 reps Beginner Head Nod - 1 x daily - 7 x weekly - 2 sets - 8 reps Bouncing on the Bed - 1 x daily - 7 x weekly - 3 sets - 20 second hold   Use Glove and perform trigger point release to muscle in jaw x 30 seconds 2-3x/day   R[place]abado's 6x6 exercise program for TMJ  1/2. Relaxation    Part 1: Tongue Clucks: Find normal resting position = holding 1/3 of tongue gently against roof of mouth just behind the front teeth.  Perform belly breathing (keep chest and collar bone relaxed/down) through nose while tongue is in resting position x 6 slow breaths.  Part 2: Controlled TMJ rotation on opening: Place tongue on roof of mouth. Slowly open mouth with tongue on the roof, 6 reps slowly.  3. Mandibular rhythmic stabilization: Closing: Isometric Resisted    Tongue on roof of mouth.  Place two fingers on chin and resist clenching teeth together. Hold _6___ seconds. Relax. Repeat __6__ times per set.  Opening: Isometric    Tongue on roof of mouth.  Place fist below jaw. Resist downward movement of chin. Hold __6__ seconds. Relax. Repeat __6__ times per set.   Lateral Glide: Isometric    Tongue on roof of mouth.  Place two fingers on left side of jaw. Resist movement of jaw to same side. Hold __6__ seconds. Relax. Repeat __6__ times per set.   Repeat on right side of jaw.  Hold 6 seconds, repeat 6 times.   4. Stabilized Head Flexion/Upper Cervical Flexion / Extension    Stabilize lower neck with towel.  Gently flex and extend upper neck by nodding head.  Repeat __6_ times per set.    5. Lower Cervical  Retraction     Sit in a supportive chair with pillow behind head.  Pull head straight back, keeping eyes and jaw level. Repeat __6__ times per set.   6. Shoulder (Scapula) Retraction    Pull shoulders back, squeezing shoulder blades together. Repeat __6__ times per session.  Marland Kitchen

## 2020-06-07 ENCOUNTER — Encounter: Payer: Self-pay | Admitting: Physician Assistant

## 2020-06-07 ENCOUNTER — Ambulatory Visit (INDEPENDENT_AMBULATORY_CARE_PROVIDER_SITE_OTHER): Payer: 59 | Admitting: Physician Assistant

## 2020-06-07 ENCOUNTER — Other Ambulatory Visit: Payer: Self-pay

## 2020-06-07 DIAGNOSIS — D2371 Other benign neoplasm of skin of right lower limb, including hip: Secondary | ICD-10-CM

## 2020-06-07 DIAGNOSIS — D225 Melanocytic nevi of trunk: Secondary | ICD-10-CM | POA: Diagnosis not present

## 2020-06-07 DIAGNOSIS — D2271 Melanocytic nevi of right lower limb, including hip: Secondary | ICD-10-CM

## 2020-06-07 DIAGNOSIS — D239 Other benign neoplasm of skin, unspecified: Secondary | ICD-10-CM

## 2020-06-07 DIAGNOSIS — D229 Melanocytic nevi, unspecified: Secondary | ICD-10-CM

## 2020-06-07 NOTE — Progress Notes (Signed)
   New Patient Visit  Subjective  Melanie Jordan is a 29 y.o. female who presents for the following: Skin Problem (left chest- dark spot- pt does use tanning bed, right post calf x 1 year- "dont like it"). New mole right chest for 6 months. She also has a bump on her right lower leg that has been present since middle school and has stayed but it doesn't itch or hurt. Right inguinal crease a raised mole that has been present for years.    Objective  Well appearing patient in no apparent distress; mood and affect are within normal limits.  A focused examination was performed including back, legs, chest, abdomen, arms. Relevant physical exam findings are noted in the Assessment and Plan. No suspicious moles noted on back.  Objective  Right Breast, Right Medial Thigh: 60mmx3mm tan papule with even border and normal cell structure right chest.  Images      Objective  Right Lower Leg - Posterior: Pink papule with central scar right posterior lower leg.  Assessment & Plan  Nevus (2) Right Breast; Right Medial Thigh  She is to watch for any continued change on the chest. If the lesion In the inguinal crease ever becomes irritated it can also be removed.  Dermatofibroma Right Lower Leg - Posterior  Discussed option of excision for removal

## 2020-06-08 ENCOUNTER — Ambulatory Visit: Payer: 59 | Admitting: Physical Therapy

## 2020-06-08 ENCOUNTER — Encounter: Payer: Self-pay | Admitting: Physical Therapy

## 2020-06-08 DIAGNOSIS — R293 Abnormal posture: Secondary | ICD-10-CM

## 2020-06-08 DIAGNOSIS — R42 Dizziness and giddiness: Secondary | ICD-10-CM | POA: Diagnosis not present

## 2020-06-08 DIAGNOSIS — M542 Cervicalgia: Secondary | ICD-10-CM | POA: Diagnosis not present

## 2020-06-08 DIAGNOSIS — G44221 Chronic tension-type headache, intractable: Secondary | ICD-10-CM | POA: Diagnosis not present

## 2020-06-08 DIAGNOSIS — R2681 Unsteadiness on feet: Secondary | ICD-10-CM

## 2020-06-08 NOTE — Therapy (Signed)
[facility] [address] [place], [place], [address] Phone: [phone]   Fax:  [phone]  Physical Therapy Treatment  Patient Details  Name: Melanie Jordan MRN: [id number] Date of Birth: [birth year] Referring Provider (PT): Ray Church, MD   Encounter Date: 06/08/2020   PT End of Session - 06/08/20 1636    Visit Number 17    Number of Visits 19    Date for PT Re-Evaluation 07/03/20    Authorization Type [employer] - [employer]    PT Start Time 0930    PT Stop Time 1015    PT Time Calculation (min) 45 min    Activity Tolerance Patient tolerated treatment well    Behavior During Therapy [facility] for tasks assessed/performed           Past Medical History:  Diagnosis Date  . ADHD    now off Adderal   . Allergy   . Anxiety   . Chronic head pain    taking Metoprolol  . GERD (gastroesophageal reflux disease)   . IBS (irritable bowel syndrome)   . Migraine     Past Surgical History:  Procedure Laterality Date  . ESOPHAGEAL MANOMETRY N/A 10/13/2019   Procedure: ESOPHAGEAL MANOMETRY (EM);  Surgeon: Mauri Pole, MD;  Location: [facility];  Service: Endoscopy;  Laterality: N/A;    There were no vitals filed for this visit.   Subjective Assessment - 06/08/20 0933    Subjective Pt reporting TMJ is still painful but better than before; usually more painful in the morning.  Tuesday night pt began to get a sharp pain in L suboccipital region; got hair done that day but thought it would have settled down by now.    Pertinent History migraines, ADHD, generalized anxiety disorder, IBS, B12 deficiency    Diagnostic tests cervical MRI, LUE nerve conduction study - all WFL    Patient Stated Goals decrease HA and dizziness    Currently in Pain? Yes    Pain Score 5     Pain Location Head    Pain Orientation Left;Upper    Pain Descriptors / Indicators Sharp    Pain Type Acute pain    Pain Onset  In the past 7 days                   Vestibular Assessment - 06/08/20 1006      Oculomotor Exam   Oculomotor Alignment Normal    Ocular ROM WFL    Spontaneous Absent    Gaze-induced  Absent    Smooth Pursuits Intact    Saccades Poor trajectory   vertically, no nausea   Comment Convergence still impaired      Vestibulo-Ocular Reflex   VOR to Slow Head Movement Normal    VOR Cancellation Normal      Positional Sensitivities   Sit to Supine No dizziness    Supine to Left Side Lightheadedness    Supine to Right Side No dizziness    Supine to Sitting Lightheadedness    Nose to Right Knee Lightheadedness   pressure   Right Knee to Sitting Lightedness   pressure   Nose to Left Knee Lightheadedness   pressure   Left Knee to Sitting Lightheadedness   pressure   Head Turning x 5 Mild dizziness    Head Nodding x 5 Moderate dizziness    Rolling Right No dizziness    Rolling Left No dizziness    Positional Sensitivities Comments  no nausea, no change in HA with movements.  Dizziness returned to baseline quickly.                    [facility] Adult PT Treatment/Exercise - 06/08/20 1243      Manual Therapy   Manual Therapy Joint mobilization;Myofascial release;Manual Traction    Manual therapy comments performed in supine to address suboccipital mm tension    Joint Mobilization In supine performed grade II mobilization to upper cervical spine with lower spine in full flexion to isolate movement to upper cervical spine.  Performed AP mobilizations with head in neutral rotation and then with progressive rotation to R to target mobilization of L side    Soft tissue mobilization STM to L upper trap and L SCM in supine to reduce mm tension; when performing STM muscles noted to have multiple trigger points that twitched upon palpation    Myofascial Release suboccipital release with various methods: performed traditional method with therapist's finger pads in suboccipital space x 3  minutes and then educated pt on other ways to perform at home with finace using forearm pronation under suboccipital space or self mobilization with two tennis balls in sock    Manual Traction Performed in combination with suboccipital release above                  PT Education - 06/08/20 1635    Education Details use of tennis balls for self suboccipital release or fiance assist; progress with vestibular system and areas to continue to focus on    Person(s) Educated Patient    Methods Explanation;Demonstration    Comprehension Verbalized understanding;Returned demonstration            PT Short Term Goals - 05/22/20 2020      PT SHORT TERM GOAL #1   Title = LTG             PT Long Term Goals - 05/22/20 2020      PT LONG TERM GOAL #1   Title Pt will demonstrate independence with final vestibular, balance and aerobic HEP    Time 8    Period Weeks    Status Revised      PT LONG TERM GOAL #2   Title Pt will demonstrate no falls risk during gait as indicated by score of 30/30 on FGA    Baseline 29/30    Time 8    Period Weeks    Status Revised      PT LONG TERM GOAL #3   Title Pt will report 50% reduction in tension HA and swaying sensation when taking a shower, when at work or when sitting still in her car    Baseline still fluctuates, less severe at work and when sitting still in car.  25% reduction.    Time 8    Period Weeks    Status Revised      PT LONG TERM GOAL #4   Title Pt will demonstrate 10 deg increase in cervical rotation and side bending and report </= 3/10 HA and neck pain on average each week    Baseline sidebending: 30 deg bilaterally, rotation 60 deg bilaterally increased to 40 deg sidebending, 70 deg rotation.  HA on average is 5-6/10    Time 8    Period Weeks    Status Revised      PT LONG TERM GOAL #5   Title Pt will increase FOTO to >/= 79% and DHI will decrease by 10  more points    Baseline 65%; 56 DHI > 73% function and DHI: down to  32    Time 8    Period Weeks    Status Revised                 Plan - 06/08/20 1637    Clinical Impression Statement Pt reporting some improvement in TMJ symptoms but reporting new onset of suboccipital pain and mm tension; addressed today with manual therapy and educated pt on how to perform self release at home.  Rest of session spent performing re-assessment of vestibular and oculomotor system with pt demonstrating improvement in oculomotor function and decreased symptoms during MSQ; pt still demonstrates motion sensitivity.  Pt's HA symptoms appear to be more stable; will resume vestibular training.    Comorbidities migraines, ADHD, generalized anxiety disorder, IBS, B12 deficiency, not currently taking medication to manage migraines - previous medication had too strong side effects    Examination-Activity Limitations Locomotion Level;Stand;Sit    Examination-Participation Restrictions Cleaning;Community Activity;Driving;Shop    PT Frequency 1x / week    PT Duration 8 weeks    PT Treatment/Interventions ADLs/Self Care Home Management;Aquatic Therapy;Canalith Repostioning;Cryotherapy;Moist Heat;Gait training;Stair training;Functional mobility training;Therapeutic activities;Therapeutic exercise;Balance training;Neuromuscular re-education;Patient/family education;Manual techniques;Passive range of motion;Dry needling;Vestibular    PT Next Visit Plan How is TMJ and suboccipital?  Restart vestibular exercises as indicated focusing on habituation; corner balance.  Keep working on scapular stability/serratus, modified wall plank, push up, side planks on elbow on mat.  Deep neck flexor strengthening with BP cuff. Progress land HEP as pt is able to tolerate - thoracic spine mobilization, balance - head turns, vision removed, trampoline or physioball?  incorporate aerobic exercise on treadmill - add incline and head turns    PT Home Exercise Plan Medbridge Access Code: [email or web address] for aquatic HEP     Consulted and Agree with Plan of Care Patient           Patient will benefit from skilled therapeutic intervention in order to improve the following deficits and impairments:  Dizziness, Pain, Postural dysfunction, Decreased range of motion, Decreased balance, Difficulty walking  Visit Diagnosis: Cervicalgia  Chronic tension-type headache, intractable  Abnormal posture  Dizziness and giddiness  Unsteadiness on feet     Problem List Patient Active Problem List   Diagnosis Date Noted  . Hypermobility syndrome 03/28/2020  . Neck pain 03/28/2020  . Dysphagia   . Anterior chest wall pain 08/12/2019  . Dizziness on standing 05/24/2019  . Rapid palpitations 05/24/2019  . Adjustment disorder with anxious mood 04/02/2019  . Initiation of oral contraception 09/21/2018    Rico Junker, PT, DPT 06/08/20    4:41 PM    [facility] [address] [place], [place], [address] Phone: [phone]   Fax:  [phone]  Name: RAETTA AGOSTINELLI MRN: [id number] Date of Birth: 11-May-1991

## 2020-06-12 ENCOUNTER — Ambulatory Visit: Payer: 59 | Admitting: Physical Therapy

## 2020-06-13 ENCOUNTER — Ambulatory Visit: Payer: 59 | Admitting: Psychiatry

## 2020-06-22 ENCOUNTER — Ambulatory Visit: Payer: 59 | Admitting: Physical Therapy

## 2020-06-26 ENCOUNTER — Encounter: Payer: Self-pay | Admitting: Physical Therapy

## 2020-06-26 ENCOUNTER — Ambulatory Visit: Payer: 59 | Attending: Neurology | Admitting: Physical Therapy

## 2020-06-26 ENCOUNTER — Other Ambulatory Visit: Payer: Self-pay

## 2020-06-26 DIAGNOSIS — R2681 Unsteadiness on feet: Secondary | ICD-10-CM | POA: Diagnosis not present

## 2020-06-26 DIAGNOSIS — G44221 Chronic tension-type headache, intractable: Secondary | ICD-10-CM | POA: Diagnosis not present

## 2020-06-26 DIAGNOSIS — M542 Cervicalgia: Secondary | ICD-10-CM | POA: Insufficient documentation

## 2020-06-26 DIAGNOSIS — R293 Abnormal posture: Secondary | ICD-10-CM | POA: Diagnosis not present

## 2020-06-26 DIAGNOSIS — R42 Dizziness and giddiness: Secondary | ICD-10-CM | POA: Diagnosis not present

## 2020-06-26 DIAGNOSIS — G44201 Tension-type headache, unspecified, intractable: Secondary | ICD-10-CM | POA: Insufficient documentation

## 2020-06-26 NOTE — Therapy (Signed)
[facility] [address] [place], [place], [address] Phone: [phone]   Fax:  [phone]  Physical Therapy Treatment  Patient Details  Name: Melanie Jordan MRN: [id number] Date of Birth: 1991-09-18 Referring Provider (PT): Ray Church, MD   Encounter Date: 06/26/2020   PT End of Session - 06/26/20 1109    Visit Number 18    Number of Visits 22    Date for PT Re-Evaluation 07/26/20    Authorization Type [employer] - [employer]    PT Start Time 1016    PT Stop Time 1100    PT Time Calculation (min) 44 min    Activity Tolerance Patient limited by pain    Behavior During Therapy [facility] for tasks assessed/performed           Past Medical History:  Diagnosis Date  . ADHD    now off Adderal   . Allergy   . Anxiety   . Chronic head pain    taking Metoprolol  . GERD (gastroesophageal reflux disease)   . IBS (irritable bowel syndrome)   . Migraine     Past Surgical History:  Procedure Laterality Date  . ESOPHAGEAL MANOMETRY N/A 10/13/2019   Procedure: ESOPHAGEAL MANOMETRY (EM);  Surgeon: Mauri Pole, MD;  Location: [facility];  Service: Endoscopy;  Laterality: N/A;    There were no vitals filed for this visit.   Subjective Assessment - 06/26/20 1019    Subjective Had a head cold when returning from [facility] that has caused more HA and dizziness.  Needing quiet room with dim lights today.  Did well in [place], went to the [place] and then drove all the way home.  Only got dizzy in the small hallways.    Pertinent History migraines, ADHD, generalized anxiety disorder, IBS, B12 deficiency    Diagnostic tests cervical MRI, LUE nerve conduction study - all WFL    Patient Stated Goals decrease HA and dizziness    Currently in Pain? Yes    Pain Onset In the past 7 days              [facility] PT Assessment - 06/26/20 1028      Assessment   Medical Diagnosis vestibular migraines, L  occipital neuralgia    Referring Provider (PT) Ray Church, MD    Onset Date/Surgical Date 09/27/19    Prior Therapy yes at [facility] for vestibular rehab and then transferred to Neuro      Precautions   Precautions Other (comment)    Precaution Comments  migraines, ADHD, generalized anxiety disorder, IBS, B12 deficiency      Prior Function   Level of Independence Independent    Vocation Full time employment    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; 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 - 6:30;     Leisure exercise 4 days a week, 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; shopping      Observation/Other Assessments   Focus on Therapeutic Outcomes (FOTO)  79% overall function    Other Surveys  Dizziness Handicap Inventory (DHI)    Dizziness Handicap Inventory ([place])  increased to 40 (likely due to recent head cold increasing symptoms)      ROM / Strength   AROM / PROM / Strength AROM      AROM   Overall AROM  Deficits    Overall AROM Comments slight mm tightness due to head  cold    AROM Assessment Site Cervical    Cervical Flexion WFL    Cervical Extension WFL    Cervical - Right Side Bend 50    Cervical - Left Side Bend 50    Cervical - Right Rotation 80    Cervical - Left Rotation 80      Functional Gait  Assessment   FGA comment: Did not officially perform today due to HA and dizziness but pt reports veering with head turns at work when speaking to co-workers.  Score likely no change                         [facility] Adult PT Treatment/Exercise - 06/26/20 1105      Exercises   Exercises Other Exercises    Other Exercises  reviewed HEP and discussed saving neck stretches and TMJ exercises for when she is feeling tension or having TMJ issues.  Pt reports she does perform them when having tension as well as suboccipital release with tennis balls and reports improvement in symptoms with exercises.  Reviewed remainder of HEP  and added postural exercises and changed pec stretch to supine on foam roller or towel roll.  Pt return demonstrated exercises with theraband.  Required cues for activation of rhomboids and lower trapezius instead of pec and upper trap                  PT Education - 06/26/20 1109    Education Details progress towards goals, recert for 4 more visits and adjusted HEP    Person(s) Educated Patient    Methods Explanation;Demonstration;Handout    Comprehension Verbalized understanding;Returned demonstration          Access Code: [email or web address] URL: [email or web address] Date: 06/26/2020 Prepared by: Misty Stanley  Exercises Seated Cervical Sidebending Stretch - 1 x daily - 7 x weekly - 2 sets - 30 second hold Supine Thoracic Mobilization Towel Roll Vertical with Arm Stretch - 1 x daily - 7 x weekly - 1 sets - 2 minute hold Supine Cervical Retraction with Towel - 1 x daily - 7 x weekly - 2 sets - 10 reps Beginner Head Nod - 1 x daily - 7 x weekly - 2 sets - 8 reps Supine Shoulder Horizontal Abduction with Resistance - 1 x daily - 7 x weekly - 2 sets - 10 reps Supine PNF D2 Flexion with Resistance - 1 x daily - 7 x weekly - 2 sets - 10 reps Bouncing on the Bed - 1 x daily - 7 x weekly - 3 sets - 20 second hold   Use Glove and perform trigger point release to muscle in jaw x 30 seconds 2-3x/day   Rocabado's 6x6 exercise program for TMJ  1/2. Relaxation    Part 1: Tongue Clucks: Find normal resting position = holding 1/3 of tongue gently against roof of mouth just behind the front teeth.  Perform belly breathing (keep chest and collar bone relaxed/down) through nose while tongue is in resting position x 6 slow breaths.  Part 2: Controlled TMJ rotation on opening: Place tongue on roof of mouth. Slowly open mouth with tongue on the roof, 6 reps slowly.  3. Mandibular rhythmic stabilization: Closing: Isometric Resisted    Tongue on roof of mouth.  Place two fingers  on chin and resist clenching teeth together. Hold _6___ seconds. Relax. Repeat __6__ times per set.  Opening: Isometric    Tongue on roof of mouth.  Place  fist below jaw. Resist downward movement of chin. Hold __6__ seconds. Relax. Repeat __6__ times per set.   Lateral Glide: Isometric    Tongue on roof of mouth.  Place two fingers on left side of jaw. Resist movement of jaw to same side. Hold __6__ seconds. Relax. Repeat __6__ times per set.   Repeat on right side of jaw.  Hold 6 seconds, repeat 6 times.   4. Stabilized Head Flexion/Upper Cervical Flexion / Extension    Stabilize lower neck with towel.  Gently flex and extend upper neck by nodding head.  Repeat __6_ times per set.    5. Lower Cervical Retraction     Sit in a supportive chair with pillow behind head.  Pull head straight back, keeping eyes and jaw level. Repeat __6__ times per set.   6. Shoulder (Scapula) Retraction    Pull shoulders back, squeezing shoulder blades together. Repeat __6__ times per session.  .     PT Short Term Goals - 05/22/20 2020      PT SHORT TERM GOAL #1   Title = LTG             PT Long Term Goals - 06/26/20 1023      PT LONG TERM GOAL #1   Title Pt will demonstrate independence with final vestibular, balance and aerobic HEP    Baseline not able to start vestibular HEP today    Time 8    Period Weeks    Status Partially Met      PT LONG TERM GOAL #2   Title Pt will demonstrate no falls risk during gait as indicated by score of 30/30 on FGA    Baseline 29/30 - unable to assess formally but pt report veering with head turns at work    Time 8    Period Weeks    Status Unable to assess      PT LONG TERM GOAL #3   Title Pt will report 50% reduction in tension HA and swaying sensation when taking a shower, when at work or when sitting still in her car    Baseline was on target to meet goal until she got a head cold    Time 8    Period Weeks    Status  Partially Met      PT LONG TERM GOAL #4   Title Pt will demonstrate 10 deg increase in cervical rotation and side bending and report </= 3/10 HA and neck pain on average each week    Baseline has met neck goal; HA continues to be 5-6/10    Time 8    Period Weeks    Status Partially Met      PT LONG TERM GOAL #5   Title Pt will increase FOTO to >/= 79% and DHI will decrease by 10 more points    Baseline FOTO 79%; DHI increased to 40    Time 8    Period Weeks    Status Partially Met           New LTG for recertification:  PT Long Term Goals - 06/26/20 1118      PT LONG TERM GOAL #1   Title Pt will demonstrate independence with final vestibular, balance and aerobic HEP    Baseline not able to start vestibular HEP today    Time 4    Period Weeks    Status Revised    Target Date 07/26/20      PT LONG TERM  GOAL #2   Title Pt will demonstrate no falls risk during gait as indicated by score of 30/30 on FGA    Baseline 29/30 - unable to assess formally but pt report veering with head turns at work    Time 4    Period Weeks    Status Revised    Target Date 07/26/20      PT LONG TERM GOAL #3   Title Pt will report 50% reduction in tension HA and swaying sensation when taking a shower, when at work or when sitting still in her car    Baseline was on target to meet goal until she got a head cold    Time 4    Period Weeks    Status Revised    Target Date 07/26/20      PT LONG TERM GOAL #5   Title FOTO will remain >79%; [place] will decrease by 10 more points    Baseline FOTO 79%; [place] increased to 40    Time 4    Period Weeks    Status Revised    Target Date 07/26/20                Plan - 06/26/20 1112    Clinical Impression Statement With new migraine medication pt's migraines are more controlled and pt demonstrates decreased frequency, intensity of headaches and dizziness.  Pt was making good progress towards LTG and was able to tolerate reassessment of vestibular  system last visit.  Plan was to return to vestibular training today but pt reporting slight regression due to recent head cold/sinus infection with return of HA and dizziness.  Patient has partially met 4/5 LTG and demonstrates significant improvement in cervical ROM, independence with cervical stretches and suboccipital release, improved overall function and activity tolerance.  Did not formally re-assess FGA today but pt report ongoing veering when performing head turns during gait.  Pt will benefit from 4 more visits to continue to address these impairments to maximize functional mobility independence.    Comorbidities migraines, ADHD, generalized anxiety disorder, IBS, B12 deficiency, not currently taking medication to manage migraines - previous medication had too strong side effects    Examination-Activity Limitations Locomotion Level;Stand;Sit    Examination-Participation Restrictions Cleaning;Community Activity;Driving;Shop    Rehab Potential Good    PT Frequency 1x / week    PT Duration 4 weeks    PT Treatment/Interventions ADLs/Self Care Home Management;Canalith Repostioning;Cryotherapy;Moist Heat;Gait training;Stair training;Functional mobility training;Therapeutic activities;Therapeutic exercise;Balance training;Neuromuscular re-education;Patient/family education;Manual techniques;Passive range of motion;Dry needling;Vestibular    PT Next Visit Plan How is head cold?  Restart vestibular exercises as indicated focusing on habituation; corner balance.  Keep working on scapular stability/serratus, modified wall plank, push up, side planks on elbow on mat.  Deep neck flexor strengthening with BP cuff. Progress land HEP as pt is able to tolerate - thoracic spine mobilization, balance - head turns, vision removed, trampoline or physioball?  incorporate aerobic exercise on treadmill - add incline and head turns    PT Home Exercise Plan Medbridge Access Code: [email or web address] for aquatic HEP    Consulted and  Agree with Plan of Care Patient           Patient will benefit from skilled therapeutic intervention in order to improve the following deficits and impairments:  Dizziness, Pain, Postural dysfunction, Decreased range of motion, Decreased balance, Difficulty walking  Visit Diagnosis: Cervicalgia  Chronic tension-type headache, intractable  Abnormal posture  Dizziness and giddiness  Unsteadiness on feet  Acute  intractable tension-type headache     Problem List Patient Active Problem List   Diagnosis Date Noted  . Hypermobility syndrome 03/28/2020  . Neck pain 03/28/2020  . Dysphagia   . Anterior chest wall pain 08/12/2019  . Dizziness on standing 05/24/2019  . Rapid palpitations 05/24/2019  . Adjustment disorder with anxious mood 04/02/2019  . Initiation of oral contraception 09/21/2018    Rico Junker, PT, DPT 06/26/20    11:19 AM    [facility] [address] [place], [place], [address] Phone: [phone]   Fax:  [phone]  Name: WINDI TORO MRN: [id number] Date of Birth: Jun 13, 1991

## 2020-06-26 NOTE — Patient Instructions (Addendum)
Access Code: [email or web address] URL: [email or web address] Date: 06/26/2020 Prepared by: Misty Stanley  Exercises Seated Cervical Sidebending Stretch - 1 x daily - 7 x weekly - 2 sets - 30 second hold Supine Thoracic Mobilization Towel Roll Vertical with Arm Stretch - 1 x daily - 7 x weekly - 1 sets - 2 minute hold Supine Cervical Retraction with Towel - 1 x daily - 7 x weekly - 2 sets - 10 reps Beginner Head Nod - 1 x daily - 7 x weekly - 2 sets - 8 reps Supine Shoulder Horizontal Abduction with Resistance - 1 x daily - 7 x weekly - 2 sets - 10 reps Supine PNF D2 Flexion with Resistance - 1 x daily - 7 x weekly - 2 sets - 10 reps Bouncing on the Bed - 1 x daily - 7 x weekly - 3 sets - 20 second hold   Use Glove and perform trigger point release to muscle in jaw x 30 seconds 2-3x/day   Rocabado's 6x6 exercise program for TMJ  1/2. Relaxation    Part 1: Tongue Clucks: Find normal resting position = holding 1/3 of tongue gently against roof of mouth just behind the front teeth.  Perform belly breathing (keep chest and collar bone relaxed/down) through nose while tongue is in resting position x 6 slow breaths.  Part 2: Controlled TMJ rotation on opening: Place tongue on roof of mouth. Slowly open mouth with tongue on the roof, 6 reps slowly.  3. Mandibular rhythmic stabilization: Closing: Isometric Resisted    Tongue on roof of mouth.  Place two fingers on chin and resist clenching teeth together. Hold _6___ seconds. Relax. Repeat __6__ times per set.  Opening: Isometric    Tongue on roof of mouth.  Place fist below jaw. Resist downward movement of chin. Hold __6__ seconds. Relax. Repeat __6__ times per set.   Lateral Glide: Isometric    Tongue on roof of mouth.  Place two fingers on left side of jaw. Resist movement of jaw to same side. Hold __6__ seconds. Relax. Repeat __6__ times per set.   Repeat on right side of jaw.  Hold 6 seconds, repeat 6  times.   4. Stabilized Head Flexion/Upper Cervical Flexion / Extension    Stabilize lower neck with towel.  Gently flex and extend upper neck by nodding head.  Repeat __6_ times per set.    5. Lower Cervical Retraction     Sit in a supportive chair with pillow behind head.  Pull head straight back, keeping eyes and jaw level. Repeat __6__ times per set.   6. Shoulder (Scapula) Retraction    Pull shoulders back, squeezing shoulder blades together. Repeat __6__ times per session.  Marland Kitchen

## 2020-06-27 ENCOUNTER — Ambulatory Visit (INDEPENDENT_AMBULATORY_CARE_PROVIDER_SITE_OTHER): Payer: 59 | Admitting: Psychiatry

## 2020-06-27 DIAGNOSIS — F411 Generalized anxiety disorder: Secondary | ICD-10-CM

## 2020-06-27 NOTE — Progress Notes (Signed)
      [facility] Counselor/Therapist Progress Note  Patient ID: Melanie Jordan, MRN: [id number],    Date: 06/27/2020  Time Spent: 60 minutes  11:00am to 12:00noon  Treatment Type: Individual Therapy  Reported Symptoms: anxiety, some sadness re: suicide of person close to family  Mental Status Exam:  Appearance:   Casual     Behavior:  Appropriate, Sharing and Motivated  Motor:  Normal  Speech/Language:   Clear and Coherent  Affect:  anxious, sadness about death of person close to family  Mood:  anxious  Thought process:  normal  Thought content:    WNL  Sensory/Perceptual disturbances:    WNL  Orientation:  oriented to person, place, time/date, situation, day of week, month of year and year  Attention:  Good  Concentration:  Good  Memory:  WNL  Fund of knowledge:   Good  Insight:    Good  Judgment:   Good  Impulse Control:  Good/Fair   Risk Assessment: Danger to Self:  No Self-injurious Behavior: No Danger to Others: No Duty to Warn:no Physical Aggression / Violence:No  Access to Firearms a concern: No  Gang Involvement:No   Subjective: Patient today reports anxiety, and sadness due to suicide of person close to family.   Interventions: Cognitive Behavioral Therapy and Solution-Oriented/Positive Psychology  Diagnosis:   ICD-10-CM   1. Generalized anxiety disorder  F41.1      Plan of Care: Patient not signing Tx Plan on computer screen due to COVID-19.  Treatment Goals: Goals will remain on tx plan as patient works on strategies to achieve her goals. Progress will be noted each session in "Progress" section of the Plan.  Long term goal: Reduce overall level, frequency, and intensity of the anxiety so that daily functioning is not impaired. *Will self-rate herself as a "3 or under" for a period of at least 3 months.   Short term goal: Verbalize an understanding of the role thatanxious/negative/depressivehinking plays in creating fears, excessive  worry, and persistent anxiety symptoms.  Strategy: Identify, challenge, and replaceanxious/fearfulthoughts andself-talk with positive, realistic, and empoweringthought andself-talkthat do not support anxiety nor fearfulness.  Progress: Patient today reports anxiety and sadness due to suicide of person close to family.  Her anxiety is actually decreased some since last session. Discussed her progress and how she is able to better manage certain stressors including difficult people at work.  Shared that a friend of family recently committed suicide.  Patient was clearly affected by this news and processed her thoughts and feelings (sadness, disbelief, anger) in session today. Had not experienced "something like this before close to the family" and was surprised she felt the anger.  Was able to talk this through and later stated she understood her  anger was related to how the person took their own life but then has left many other people hurting and grieving. Patient spoke very openly and was more grounded by session end. Her self-talk is becoming more positive and health anxiety lessening, but still having some more generalized anxiety.  Goal review and progress noted with patient.  Next appt within 3 weeks.   Shanon Ace, LCSW

## 2020-07-05 ENCOUNTER — Other Ambulatory Visit: Payer: Self-pay

## 2020-07-05 ENCOUNTER — Ambulatory Visit: Payer: 59 | Admitting: Physical Therapy

## 2020-07-05 ENCOUNTER — Encounter: Payer: Self-pay | Admitting: Physical Therapy

## 2020-07-05 DIAGNOSIS — R42 Dizziness and giddiness: Secondary | ICD-10-CM

## 2020-07-05 DIAGNOSIS — G44221 Chronic tension-type headache, intractable: Secondary | ICD-10-CM | POA: Diagnosis not present

## 2020-07-05 DIAGNOSIS — R2681 Unsteadiness on feet: Secondary | ICD-10-CM

## 2020-07-05 DIAGNOSIS — G44201 Tension-type headache, unspecified, intractable: Secondary | ICD-10-CM | POA: Diagnosis not present

## 2020-07-05 DIAGNOSIS — R293 Abnormal posture: Secondary | ICD-10-CM | POA: Diagnosis not present

## 2020-07-05 DIAGNOSIS — M542 Cervicalgia: Secondary | ICD-10-CM | POA: Diagnosis not present

## 2020-07-05 NOTE — Therapy (Signed)
[facility] [address] [place], [place], [address] Phone: [phone]   Fax:  [phone]  Physical Therapy Treatment  Patient Details  Name: Melanie Jordan MRN: [id number] Date of Birth: 1991-05-30 Referring Provider (PT): Ray Church, MD   Encounter Date: 07/05/2020   PT End of Session - 07/05/20 1055    Visit Number 19    Number of Visits 22    Date for PT Re-Evaluation 07/26/20    Authorization Type [employer] Employee - [employer]    PT Start Time 0809    PT Stop Time 0845    PT Time Calculation (min) 36 min    Activity Tolerance Patient tolerated treatment well    Behavior During Therapy [facility] for tasks assessed/performed           Past Medical History:  Diagnosis Date  . ADHD    now off Adderal   . Allergy   . Anxiety   . Chronic head pain    taking Metoprolol  . GERD (gastroesophageal reflux disease)   . IBS (irritable bowel syndrome)   . Migraine     Past Surgical History:  Procedure Laterality Date  . ESOPHAGEAL MANOMETRY N/A 10/13/2019   Procedure: ESOPHAGEAL MANOMETRY (EM);  Surgeon: Mauri Pole, MD;  Location: [facility];  Service: Endoscopy;  Laterality: N/A;    There were no vitals filed for this visit.   Subjective Assessment - 07/05/20 0812    Subjective Head cold is better, headaches and dizziness is better.  Feels able to do vestibular exercises today.    Pertinent History migraines, ADHD, generalized anxiety disorder, IBS, B12 deficiency    Diagnostic tests cervical MRI, LUE nerve conduction study - all WFL    Patient Stated Goals decrease HA and dizziness    Currently in Pain? Yes    Pain Score 1     Pain Location Head    Pain Descriptors / Indicators Headache    Pain Onset In the past 7 days                              Vestibular Treatment/Exercise - 07/05/20 1051      Vestibular Treatment/Exercise   Vestibular Treatment Provided  Habituation;Gaze    Habituation Exercises Seated Vertical Head Turns;180 degree Turns;Seated Horizontal Head Turns    Gaze Exercises X1 Viewing Horizontal;X1 Viewing Vertical      Seated Horizontal Head Turns   Number of Reps  16    Symptom Description  seated in chair reaching down to floor to retrieve object and return to upright sitting combined with head/body turn to place object to R or L, 4 reps going floor <> R and then 4 reps from floor <> L, 2 sets each.      Seated Vertical Head Turns   Number of Reps  16    Symptom Description  seated in chair reaching down to floor to retrieve object and return to upright sitting combined with head/body turn to place object to R or L, 4 reps going floor <> R and then 4 reps from floor <> L, 2 sets each.        180 degree Turns   Number of Reps  5    Symptom Description  x 4 sets, wide leg squat with alternating between R and L 180 turn moving down counter and then back.  Mild dizziness  X1 Viewing Horizontal   Foot Position unsupported sitting    Reps 2    Comments able to tolerate 30 seconds with mild symptoms      X1 Viewing Vertical   Foot Position unsupported sitting    Reps 2    Comments able to tolerate 30 seconds with mild symptoms              PT Education - 07/05/20 1055    Education Details updated HEP to include vestibular exercises and activities to simulate work activities    Person(s) Educated Patient    Methods Explanation;Demonstration;Handout    Comprehension Verbalized understanding;Returned demonstration            Gaze Stabilization: Sitting    Keeping eyes on target on wall 3 feet away, and move head side to side for _30__ seconds. Rest, let symptoms settle - keep symptoms mild.  Repeat while moving head up and down for _30_ seconds. Do __2__ sets per day.  Gaze Stabilization: Tip Card  1.Target must remain in focus, not blurry, and appear stationary while head is in motion. 2.Perform exercises  with small head movements (45 to either side of midline). 3.Increase speed of head motion so long as target is in focus. 4.If you wear eyeglasses, be sure you can see target through lens (therapist will give specific instructions for bifocal / progressive lenses). 5.These exercises may provoke dizziness or nausea. Work through these symptoms. If too dizzy, slow head movement slightly. Rest between each exercise. 6.Exercises demand concentration; avoid distractions.    Bending / Picking Up Objects    Sitting, place 5 objects on the floor in front of you: bend head down and pick up object on the floor, come back upright and turn to the right and place it on a table. Repeat placing all objects on the table to the right and then reverse, placing objects back down on the floor. Then repeat placing objects on a table to the left and then placing them back down on the floor. Repeat __2__ times per session.    Squat With 180 Degree Turn Alternating Left and Right    Stand at your counter - do a wide leg squat and a 180 turn to the right.  Squat again and turn to the left.  Keep alternating left and right turns as you move down your counter.  Pause and then repeat going back down the counter.  Perform 2 sets and focus on keeping neck neutral and relaxed, pick a spot straight lower to look at.       PT Short Term Goals - 05/22/20 2020      PT SHORT TERM GOAL #1   Title = LTG             PT Long Term Goals - 06/26/20 1118      PT LONG TERM GOAL #1   Title Pt will demonstrate independence with final vestibular, balance and aerobic HEP    Baseline not able to start vestibular HEP today    Time 4    Period Weeks    Status Revised    Target Date 07/26/20      PT LONG TERM GOAL #2   Title Pt will demonstrate no falls risk during gait as indicated by score of 30/30 on FGA    Baseline 29/30 - unable to assess formally but pt report veering with head turns at work    Time 4    Period  Weeks  Status Revised    Target Date 07/26/20      PT LONG TERM GOAL #3   Title Pt will report 50% reduction in tension HA and swaying sensation when taking a shower, when at work or when sitting still in her car    Baseline was on target to meet goal until she got a head cold    Time 4    Period Weeks    Status Revised    Target Date 07/26/20      PT LONG TERM GOAL #5   Title FOTO will remain >79%; [place] will decrease by 10 more points    Baseline FOTO 79%; [place] increased to 40    Time 4    Period Weeks    Status Revised    Target Date 07/26/20                 Plan - 07/05/20 1056    Clinical Impression Statement Pt reporting improvement in dizziness and HA today and pt better able to tolerate vestibular exercises today.  Re-initiated vestibular HEP focusing on gaze adaptation and habituation simulating work activities and patient advised to perform every other day.  Will continue to progress as pt is able to tolerate.    Comorbidities migraines, ADHD, generalized anxiety disorder, IBS, B12 deficiency, not currently taking medication to manage migraines - previous medication had too strong side effects    Examination-Activity Limitations Locomotion Level;Stand;Sit    Examination-Participation Restrictions Cleaning;Community Activity;Driving;Shop    Rehab Potential Good    PT Frequency 1x / week    PT Duration 4 weeks    PT Treatment/Interventions ADLs/Self Care Home Management;Canalith Repostioning;Cryotherapy;Moist Heat;Gait training;Stair training;Functional mobility training;Therapeutic activities;Therapeutic exercise;Balance training;Neuromuscular re-education;Patient/family education;Manual techniques;Passive range of motion;Dry needling;Vestibular    PT Next Visit Plan How is vestibular HEP going?  Progress as able.  Simulate work activity of bending down to ground with squat, turn, walk.  Keep working on scapular stability/serratus, modified wall plank, push up, side  planks on elbow on mat.  Deep neck flexor strengthening with BP cuff. Progress land HEP as pt is able to tolerate - thoracic spine mobilization, balance - head turns, vision removed, trampoline or physioball?  incorporate aerobic exercise on treadmill - add incline and head turns    PT Home Exercise Plan Medbridge Access Code: [email or web address] for aquatic HEP    Consulted and Agree with Plan of Care Patient           Patient will benefit from skilled therapeutic intervention in order to improve the following deficits and impairments:  Dizziness, Pain, Postural dysfunction, Decreased range of motion, Decreased balance, Difficulty walking  Visit Diagnosis: Dizziness and giddiness  Unsteadiness on feet     Problem List Patient Active Problem List   Diagnosis Date Noted  . Hypermobility syndrome 03/28/2020  . Neck pain 03/28/2020  . Dysphagia   . Anterior chest wall pain 08/12/2019  . Dizziness on standing 05/24/2019  . Rapid palpitations 05/24/2019  . Adjustment disorder with anxious mood 04/02/2019  . Initiation of oral contraception 09/21/2018    Melanie Jordan, PT, DPT 07/05/20    11:02 AM    [facility] [address] [place], [place], [address] Phone: [phone]   Fax:  [phone]  Name: Melanie Jordan MRN: [id number] Date of Birth: 18-Feb-1991

## 2020-07-05 NOTE — Patient Instructions (Addendum)
Gaze Stabilization: Sitting    Keeping eyes on target on wall 3 feet away, and move head side to side for _30__ seconds. Rest, let symptoms settle - keep symptoms mild.  Repeat while moving head up and down for _30_ seconds. Do __2__ sets per day.  Gaze Stabilization: Tip Card  1.Target must remain in focus, not blurry, and appear stationary while head is in motion. 2.Perform exercises with small head movements (45 to either side of midline). 3.Increase speed of head motion so long as target is in focus. 4.If you wear eyeglasses, be sure you can see target through lens (therapist will give specific instructions for bifocal / progressive lenses). 5.These exercises may provoke dizziness or nausea. Work through these symptoms. If too dizzy, slow head movement slightly. Rest between each exercise. 6.Exercises demand concentration; avoid distractions.    Bending / Picking Up Objects    Sitting, place 5 objects on the floor in front of you: bend head down and pick up object on the floor, come back upright and turn to the right and place it on a table. Repeat placing all objects on the table to the right and then reverse, placing objects back down on the floor. Then repeat placing objects on a table to the left and then placing them back down on the floor. Repeat __2__ times per session.    Squat With 180 Degree Turn Alternating Left and Right    Stand at your counter - do a wide leg squat and a 180 turn to the right.  Squat again and turn to the left.  Keep alternating left and right turns as you move down your counter.  Pause and then repeat going back down the counter.  Perform 2 sets and focus on keeping neck neutral and relaxed, pick a spot straight lower to look at.

## 2020-07-11 ENCOUNTER — Ambulatory Visit (INDEPENDENT_AMBULATORY_CARE_PROVIDER_SITE_OTHER): Payer: 59 | Admitting: Psychiatry

## 2020-07-11 ENCOUNTER — Other Ambulatory Visit: Payer: Self-pay

## 2020-07-11 DIAGNOSIS — F411 Generalized anxiety disorder: Secondary | ICD-10-CM | POA: Diagnosis not present

## 2020-07-11 NOTE — Progress Notes (Signed)
      [facility] Counselor/Therapist Progress Note  Patient ID: Melanie Jordan, MRN: [id number],    Date: 07/11/2020  Time Spent: 60 minutes  10:00am to 11:00am  Treatment Type: Individual Therapy  Reported Symptoms: anxiety, anxious about upcoming trip, "worries"  Mental Status Exam:  Appearance:   Casual     Behavior:  Appropriate, Sharing and Motivated  Motor:  Normal  Speech/Language:   Clear and Coherent  Affect:  anxious, "worrying"  Mood:  anxious  Thought process:  normal  Thought content:    WNL and some occasional obsessiveness  Sensory/Perceptual disturbances:    WNL  Orientation:  oriented to person, place, time/date, situation, day of week, month of year and year  Attention:  Good  Concentration:  Fair  Memory:  WNL  Fund of knowledge:   Good  Insight:    Fair  Judgment:   Fair  Impulse Control:  Fair   Risk Assessment: Danger to Self:  No Self-injurious Behavior: No Danger to Others: No Duty to Warn:no Physical Aggression / Violence:No  Access to Firearms a concern: No  Gang Involvement:No   Subjective: Patient today reports anxiety and "worries" re: work and some of her co-workers, any deviations in her schedule, traveling.  Interventions: Cognitive Behavioral Therapy and Solution-Oriented/Positive Psychology  Diagnosis:   ICD-10-CM   1. Generalized anxiety disorder  F41.1      Plan of Care: Patient not signing Tx Plan on computer screen due to COVID-19.  Treatment Goals: Goals will remain on tx plan as patient works on strategies to achieve her goals. Progress will be noted each session in "Progress" section of the Plan.  Long term goal: Reduce overall level, frequency, and intensity of the anxiety so that daily functioning is not impaired. *Will self-rate herself as a "3 or under" for a period of at least 3 months.   Short term goal: Verbalize an understanding of the role thatanxious/negative/depressivehinking plays in  creating fears, excessive worry, and persistent anxiety symptoms.  Strategy: Identify, challenge, and replaceanxious/fearfulthoughts andself-talk with positive, realistic, and empoweringthought andself-talkthat do not support anxiety nor fearfulness.  Progress: Patient in today reporting anxiety that she is more aware of in relation to work situations and co-workers, hard to tolerate changes in her schedule or expectations. Grief over suicide of person close to family" has subsided a good bit since last visit." Another relative that she did not know well, overdosed and died in [place] since patient was last seen here for appt. Is stressing about upcoming trip " as I typically do." Worked on better understanding of the messages and thoughts that are leading her to feel more anxious in general and about her trip especially, and that seemed to help patient especially in realizing how her anxious thoughts and fears are irrational and lead to increased anxiety. She is to work, between sessions, on interrupting them more quickly and changing them to more positive, and reality based thoughts that do not lead to anxiety and worry.  (per short-term goal and strategy in treatment plan)  Goal review and progress/challenges noted with patient.  Next appt within 2-3 weeks.   Shanon Ace, LCSW

## 2020-07-12 ENCOUNTER — Ambulatory Visit: Payer: 59 | Admitting: Physical Therapy

## 2020-07-12 ENCOUNTER — Encounter: Payer: Self-pay | Admitting: Physical Therapy

## 2020-07-12 DIAGNOSIS — R42 Dizziness and giddiness: Secondary | ICD-10-CM

## 2020-07-12 DIAGNOSIS — R293 Abnormal posture: Secondary | ICD-10-CM | POA: Diagnosis not present

## 2020-07-12 DIAGNOSIS — G44201 Tension-type headache, unspecified, intractable: Secondary | ICD-10-CM | POA: Diagnosis not present

## 2020-07-12 DIAGNOSIS — R2681 Unsteadiness on feet: Secondary | ICD-10-CM

## 2020-07-12 DIAGNOSIS — G44221 Chronic tension-type headache, intractable: Secondary | ICD-10-CM | POA: Diagnosis not present

## 2020-07-12 DIAGNOSIS — M542 Cervicalgia: Secondary | ICD-10-CM | POA: Diagnosis not present

## 2020-07-12 NOTE — Patient Instructions (Addendum)
Gaze Stabilization: Sitting    Keeping eyes on target on wall 3 feet away, and move head side to side for _60__ seconds. Rest, let symptoms settle - keep symptoms mild.  Repeat while moving head up and down for _60_ seconds. Do __2__ sets per day.  Gaze Stabilization: Tip Card  1.Target must remain in focus, not blurry, and appear stationary while head is in motion. 2.Perform exercises with small head movements (45 to either side of midline). 3.Increase speed of head motion so long as target is in focus. 4.If you wear eyeglasses, be sure you can see target through lens (therapist will give specific instructions for bifocal / progressive lenses). 5.These exercises may provoke dizziness or nausea. Work through these symptoms. If too dizzy, slow head movement slightly. Rest between each exercise. 6.Exercises demand concentration; avoid distractions.  Reaching / Placing Object, Diagonal Pattern    With feet apart, use two hands to pick up object down on right side and place on surface up on opposite side. Repeat __5__ times per session. Do _2___ sets, one to the left, one to the right   Squat With 180 Degree Turn Alternating Left and Right    Stand on two blue balance pads side by side- do a wide leg squat plus a 180 turn to the right.  Squat again and turn to the left.  Keep alternating left and right turns between the two pads.  10 turns total.  Keep neck relaxed

## 2020-07-12 NOTE — Therapy (Signed)
[facility] 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 ADHD    now off Adderal   . Allergy   . Anxiety   . Chronic head pain    taking Metoprolol  . GERD (gastroesophageal reflux disease)   . IBS (irritable bowel syndrome)   . Migraine     Past Surgical History:  Procedure Laterality Date  . ESOPHAGEAL MANOMETRY N/A 10/13/2019   Procedure: ESOPHAGEAL MANOMETRY (EM);  Surgeon: Mauri Pole, MD;  Location: [facility];  Service: Endoscopy;  Laterality: N/A;    There were no vitals filed for this visit.   Subjective Assessment - 07/12/20 0849    Subjective Dizziness was okay after last session.  Has been performing exercises.  Tried standing x1 viewing but it was worse.    Pertinent History migraines, ADHD, generalized anxiety disorder, IBS, B12 deficiency    Diagnostic tests cervical MRI, LUE nerve conduction study - all WFL    Patient Stated Goals decrease HA and dizziness    Currently in Pain? No/denies    Pain Onset In the past 7 days                              Vestibular Treatment/Exercise - 07/12/20 0851      Vestibular Treatment/Exercise   Vestibular Treatment Provided Gaze    Habituation Exercises Standing Vertical Head  Turns;Standing Horizontal Head Turns;Standing Diagonal Head Turns    Gaze Exercises X1 Viewing Horizontal;X1 Viewing Vertical      Standing Vertical Head Turns   Number of Reps  5    Symptom Description  4 sets bending down to ground for cones and placing on table and then returning to ground      Standing Diagonal Head Turns   Number of Reps  5    Symptiom Description  4 sets both directions reaching diagonally down to ground for cones back up to table on contralateral side.  Performed to L and R.  Mild dizziness afterwards but able to walk two laps after performing with decrease in dizziness when not standing still      180 degree Turns   Number of Reps  10    Symptom Description  180 deg turn to L and R progressing down gym x 30' with looking down at ground for ball bounce, then added in deep squat x 20' with ball bounce - no symptoms.  Changed to walking forwards with squat, ball bounce > 180 turn and repeat to simulate job duties.  No symptoms.  Instructed pt to perform the squat and 180 turn on compliant foam at home.  X1 Viewing Horizontal   Foot Position unsupported sitting    Reps 2    Comments 45 seconds mild symptoms > 60 seconds mild symptoms, eye fatigue      X1 Viewing Vertical   Foot Position unsupported sitting    Reps 2    Comments 45 seconds with mild symptoms > 60 seconds mild symptoms                 PT Education - 07/12/20 1621    Education Details updated HEP    Person(s) Educated Patient    Methods Explanation;Demonstration;Handout    Comprehension Verbalized understanding;Returned demonstration            PT Short Term Goals - 05/22/20 2020      PT SHORT TERM GOAL #1   Title = LTG             PT Long Term Goals - 06/26/20 1118      PT LONG TERM GOAL #1   Title Pt will demonstrate independence with final vestibular, balance and aerobic HEP    Baseline not able to start vestibular HEP today    Time 4    Period Weeks    Status  Revised    Target Date 07/26/20      PT LONG TERM GOAL #2   Title Pt will demonstrate no falls risk during gait as indicated by score of 30/30 on FGA    Baseline 29/30 - unable to assess formally but pt report veering with head turns at work    Time 4    Period Weeks    Status Revised    Target Date 07/26/20      PT LONG TERM GOAL #3   Title Pt will report 50% reduction in tension HA and swaying sensation when taking a shower, when at work or when sitting still in her car    Baseline was on target to meet goal until she got a head cold    Time 4    Period Weeks    Status Revised    Target Date 07/26/20      PT LONG TERM GOAL #5   Title FOTO will remain >79%; [place] will decrease by 10 more points    Baseline FOTO 79%; [place] increased to 40    Time 4    Period Weeks    Status Revised    Target Date 07/26/20                 Plan - 07/12/20 1623    Clinical Impression Statement Pt continues to report improvement in HA and dizziness.  Reviewed and upgraded exercises today - increased duration for VOR, progressed bending down to diagonals in standing and squat 180 turns to compliant surface.  Pt tolerated well and when pt was experiencing dizziness she was able to recover with walking laps around gym.    Comorbidities migraines, ADHD, generalized anxiety disorder, IBS, B12 deficiency, not currently taking medication to manage migraines - previous medication had too strong side effects    Examination-Activity Limitations Locomotion Level;Stand;Sit    Examination-Participation Restrictions Cleaning;Community Activity;Driving;Shop    Rehab Potential Good    PT Frequency 1x / week    PT Duration 4 weeks    PT Treatment/Interventions ADLs/Self Care Home Management;Canalith Repostioning;Cryotherapy;Moist Heat;Gait training;Stair training;Functional mobility training;Therapeutic activities;Therapeutic exercise;Balance training;Neuromuscular re-education;Patient/family education;Manual  techniques;Passive range of motion;Dry needling;Vestibular    PT Next Visit Plan Vestibular HEP - progress bending down diagonals to higher  surface and compliant?  VOR to standing.  squats and turns on trampoline, marching turns.  Balance with EC.    PT Home Exercise Plan Medbridge Access Code: [email or web address] for aquatic HEP    Consulted and Agree with Plan of Care Patient           Patient will benefit from skilled therapeutic intervention in order to improve the following deficits and impairments:  Dizziness, Pain, Postural dysfunction, Decreased range of motion, Decreased balance, Difficulty walking  Visit Diagnosis: Dizziness and giddiness  Unsteadiness on feet     Problem List Patient Active Problem List   Diagnosis Date Noted  . Hypermobility syndrome 03/28/2020  . Neck pain 03/28/2020  . Dysphagia   . Anterior chest wall pain 08/12/2019  . Dizziness on standing 05/24/2019  . Rapid palpitations 05/24/2019  . Adjustment disorder with anxious mood 04/02/2019  . Initiation of oral contraception 09/21/2018    Rico Junker, PT, DPT 07/12/20    4:29 PM    [facility] [address] [place], [place], [address] Phone: [phone]   Fax:  [phone]  Name: Melanie Jordan MRN: [id number] Date of Birth: [birth year]

## 2020-07-19 ENCOUNTER — Encounter: Payer: Self-pay | Admitting: Physical Therapy

## 2020-07-19 ENCOUNTER — Other Ambulatory Visit: Payer: Self-pay

## 2020-07-19 ENCOUNTER — Ambulatory Visit: Payer: 59 | Admitting: Physical Therapy

## 2020-07-19 DIAGNOSIS — R2681 Unsteadiness on feet: Secondary | ICD-10-CM

## 2020-07-19 DIAGNOSIS — G44201 Tension-type headache, unspecified, intractable: Secondary | ICD-10-CM | POA: Diagnosis not present

## 2020-07-19 DIAGNOSIS — R42 Dizziness and giddiness: Secondary | ICD-10-CM | POA: Diagnosis not present

## 2020-07-19 DIAGNOSIS — R293 Abnormal posture: Secondary | ICD-10-CM | POA: Diagnosis not present

## 2020-07-19 DIAGNOSIS — G44221 Chronic tension-type headache, intractable: Secondary | ICD-10-CM | POA: Diagnosis not present

## 2020-07-19 DIAGNOSIS — M542 Cervicalgia: Secondary | ICD-10-CM | POA: Diagnosis not present

## 2020-07-19 NOTE — Therapy (Signed)
[facility] [address] [place], [place], [address] Phone: [phone]   Fax:  [phone]  Physical Therapy Treatment  Patient Details  Name: ABBEE Jordan MRN: [id number] Date of Birth: 1991/07/01 Referring Provider (PT): Ray Church, MD   Encounter Date: 07/19/2020   PT End of Session - 07/19/20 1213    Visit Number 21    Number of Visits 22    Date for PT Re-Evaluation 07/26/20    Authorization Type [employer] Employee - UMR    PT Start Time [phone]    PT Stop Time 0932    PT Time Calculation (min) 44 min    Activity Tolerance Patient tolerated treatment well    Behavior During Therapy [facility] for tasks assessed/performed           Past Medical History:  Diagnosis Date   ADHD    now off Adderal    Allergy    Anxiety    Chronic head pain    taking Metoprolol   GERD (gastroesophageal reflux disease)    IBS (irritable bowel syndrome)    Migraine     Past Surgical History:  Procedure Laterality Date   ESOPHAGEAL MANOMETRY N/A 10/13/2019   Procedure: ESOPHAGEAL MANOMETRY (EM);  Surgeon: Mauri Pole, MD;  Location: [facility];  Service: Endoscopy;  Laterality: N/A;    There were no vitals filed for this visit.   Subjective Assessment - 07/19/20 0851    Subjective Still having dizziness with x1 viewing but doesn't last very long.  No headache right now.  Had a very busy weekend at work and got very dizzy one time after walking back and forth quickly.    Pertinent History migraines, ADHD, generalized anxiety disorder, IBS, B12 deficiency    Diagnostic tests cervical MRI, LUE nerve conduction study - all WFL    Patient Stated Goals decrease HA and dizziness    Currently in Pain? No/denies    Pain Onset In the past 7 days                              Vestibular Treatment/Exercise - 07/19/20 0853      Vestibular Treatment/Exercise   Vestibular  Treatment Provided Gaze    Gaze Exercises X1 Viewing Horizontal;X1 Viewing Vertical      X1 Viewing Horizontal   Foot Position unsupported sitting; standing with UE support    Reps 2    Comments 60 seconds - mild dizziness in sitting and standing      X1 Viewing Vertical   Foot Position unsupported sitting; standing with UE support    Comments 60 seconds - mild dizziness in sitting and standing          Attempted to progress habituation to turns and reaching down to ground with repeated 180 deg turns reaching down to ground and then up into high cabinet; no symptoms.  Added forwards and backwards walking to bending down and 180 turns to simulate work environment.  Also reviewed balance with eyes closed as below:  Gaze Stabilization: Standing Feet Apart    Feet shoulder width apart, keeping eyes on target on wall __3__ feet away, tilt head down 15-30 and move head side to side for __60__ seconds. Repeat while moving head up and down for __60__ seconds. Do __2__ sessions per day. Stand with hands on back of a chair for support.    Balance: Eyes Closed - Bilateral (  Varied Surfaces)    Stand, feet shoulder width, standing on a pillow or cushion, close eyes. Maintain balance while performing active sways to left and right 10 times.  Then perform active sways front to back, 10 times.   Forward / Backward Progression With 180 (Half) Turns    Walk forwards at a quick pace, stop, squat and touch ground, stand up, turn 180 deg and walk back to starting point.  Squat, touch the ground, stand up, turn 180 deg and walk forwards.  Repeat 3 laps.  Rest and then repeat but walking backwards, turn 180 deg, squat and touch the ground before walking backwards again - 3 laps.      PT Education - 07/19/20 1211    Education Details updated HEP    Person(s) Educated Patient    Methods Explanation;Demonstration    Comprehension Verbalized understanding;Returned demonstration             PT Short Term Goals - 05/22/20 2020      PT SHORT TERM GOAL #1   Title = LTG             PT Long Term Goals - 06/26/20 1118      PT LONG TERM GOAL #1   Title Pt will demonstrate independence with final vestibular, balance and aerobic HEP    Baseline not able to start vestibular HEP today    Time 4    Period Weeks    Status Revised    Target Date 07/26/20      PT LONG TERM GOAL #2   Title Pt will demonstrate no falls risk during gait as indicated by score of 30/30 on FGA    Baseline 29/30 - unable to assess formally but pt report veering with head turns at work    Time 4    Period Weeks    Status Revised    Target Date 07/26/20      PT LONG TERM GOAL #3   Title Pt will report 50% reduction in tension HA and swaying sensation when taking a shower, when at work or when sitting still in her car    Baseline was on target to meet goal until she got a head cold    Time 4    Period Weeks    Status Revised    Target Date 07/26/20      PT LONG TERM GOAL #5   Title FOTO will remain >79%; [place] will decrease by 10 more points    Baseline FOTO 79%; [place] increased to 40    Time 4    Period Weeks    Status Revised    Target Date 07/26/20                 Plan - 07/19/20 1214    Clinical Impression Statement Continued to progress HEP with progressing x1 viewing to standing, balance with eyes closed on compliant surface and adding walking to squat and 180 turns to incorporate more exertion into habituation.  Pt experienced mild symptoms that subsided quickly. Will continue to address and progress towards LTG.    Comorbidities migraines, ADHD, generalized anxiety disorder, IBS, B12 deficiency, not currently taking medication to manage migraines - previous medication had too strong side effects    Examination-Activity Limitations Locomotion Level;Stand;Sit    Examination-Participation Restrictions Cleaning;Community Activity;Driving;Shop    Rehab Potential Good    PT Frequency 1x  / week    PT Duration 4 weeks    PT Treatment/Interventions ADLs/Self Care  Home Management;Canalith Repostioning;Cryotherapy;Moist Heat;Gait training;Stair training;Functional mobility training;Therapeutic activities;Therapeutic exercise;Balance training;Neuromuscular re-education;Patient/family education;Manual techniques;Passive range of motion;Dry needling;Vestibular    PT Next Visit Plan Check LTG, recert?  Progress x1,  squats and turns on trampoline, marching turns.  Balance with EC.    PT Home Exercise Plan Medbridge Access Code: [email or web address] for aquatic HEP    Consulted and Agree with Plan of Care Patient           Patient will benefit from skilled therapeutic intervention in order to improve the following deficits and impairments:  Dizziness, Pain, Postural dysfunction, Decreased range of motion, Decreased balance, Difficulty walking  Visit Diagnosis: Dizziness and giddiness  Unsteadiness on feet     Problem List Patient Active Problem List   Diagnosis Date Noted   Hypermobility syndrome 03/28/2020   Neck pain 03/28/2020   Dysphagia    Anterior chest wall pain 08/12/2019   Dizziness on standing 05/24/2019   Rapid palpitations 05/24/2019   Adjustment disorder with anxious mood 04/02/2019   Initiation of oral contraception 09/21/2018    Rico Junker, PT, DPT 07/19/20    12:19 PM    [facility] [address] [place], [place], [address] Phone: [phone]   Fax:  [phone]  Name: Melanie Jordan MRN: [id number] Date of Birth: 16-Aug-1991

## 2020-07-19 NOTE — Patient Instructions (Addendum)
Gaze Stabilization: Standing Feet Apart    Feet shoulder width apart, keeping eyes on target on wall __3__ feet away, tilt head down 15-30 and move head side to side for __60__ seconds. Repeat while moving head up and down for __60__ seconds. Do __2__ sessions per day. Stand with hands on back of a chair for support.    Balance: Eyes Closed - Bilateral (Varied Surfaces)    Stand, feet shoulder width, standing on a pillow or cushion, close eyes. Maintain balance while performing active sways to left and right 10 times.  Then perform active sways front to back, 10 times.   Forward / Backward Progression With 180 (Half) Turns    Walk forwards at a quick pace, stop, squat and touch ground, stand up, turn 180 deg and walk back to starting point.  Squat, touch the ground, stand up, turn 180 deg and walk forwards.  Repeat 3 laps.  Rest and then repeat but walking backwards, turn 180 deg, squat and touch the ground before walking backwards again - 3 laps.

## 2020-07-20 MED FILL — HYDROCODON-APAP 7.5-325: 7.5-325 | 1 days supply | Qty: 6 | Fill #0

## 2020-07-20 MED FILL — KETOROLAC 10 MG TABLET: 10 | 4 days supply | Qty: 16 | Fill #0

## 2020-07-25 ENCOUNTER — Ambulatory Visit (INDEPENDENT_AMBULATORY_CARE_PROVIDER_SITE_OTHER): Payer: 59 | Admitting: Psychiatry

## 2020-07-25 ENCOUNTER — Other Ambulatory Visit: Payer: Self-pay

## 2020-07-25 DIAGNOSIS — F411 Generalized anxiety disorder: Secondary | ICD-10-CM | POA: Diagnosis not present

## 2020-07-25 NOTE — Progress Notes (Signed)
[facility] Counselor/Therapist Progress Note  Patient ID: Melanie Jordan, MRN: [id number],    Date: 07/25/2020   Time Spent: 60 minutes   12:00noon to 1:00pm  Treatment Type: Individual Therapy  Reported Symptoms: anxiety, stress, some depression, worried  Mental Status Exam:  Appearance:   Casual     Behavior:  Appropriate, Sharing and Motivated  Motor:  Normal  Speech/Language:   Clear and Coherent  Affect:  anxious, depressed  Mood:  anxious and depressed  Thought process:  normal  Thought content:    some obsessiveness  Sensory/Perceptual disturbances:    WNL  Orientation:  oriented to person, place, time/date, situation, day of week, month of year and year  Attention:  Fair  Concentration:  Fair  Memory:  [place] of knowledge:   Good  Insight:    Good  Judgment:   Good  Impulse Control:  Fair but getting better   Risk Assessment: Danger to Self:  No Self-injurious Behavior: No Danger to Others: No Duty to Warn:no Physical Aggression / Violence:No  Access to Firearms a concern: No  Gang Involvement:No   Subjective: Patient today reports anxiety, some depression, worried, and stressed.  Increased concern about the Covid surge, especially with the trip to for wedding and seeing family planned in October. Difficulties with work situations.  Interventions: Cognitive Behavioral Therapy and Solution-Oriented/Positive Psychology  Diagnosis:   ICD-10-CM   1. Generalized anxiety disorder  F41.1      Plan of Care: Patient not signing Tx Plan on computer screen due to COVID-19.  Treatment Goals: Goals will remain on tx plan as patient works on strategies to achieve her goals. Progress will be noted each session in "Progress" section of the Plan.  Long term goal: Reduce overall level, frequency, and intensity of the anxiety so that daily functioning is not impaired. *Will self-rate herself as a "3 or under" for a period of at least 3  months.  Short term goal: Verbalize an understanding of the role thatanxious/negative/depressivehinking plays in creating fears, excessive worry, and persistent anxiety symptoms.  Strategy: Identify, challenge, and replaceanxious/fearfulthoughts andself-talk with positive, realistic, and empoweringthought andself-talkthat do not support anxiety nor fearfulness.  Progress: Patient in today reporting anxiety, some depression, worried, and stressed especially with surge in Covid and upcoming trip planned to [place] state to visit family and attend wedding.  Continues to struggle with having migraines and sees her neurologist for meds and follow up. Work frustrations and some conflictual relationships discussed in more detail.  Anxiety is being better managed by patient on some occasions, noticeable to her and others.Patient feeling encouraged but also aware and states she needs to keep building on her progress.  We reviewed her goal plan and some of the strategies that seem to have helped her more.  She is more positively reactive and I noticed she smiled several times today in session, and when I mentioned noticeable progress she readily agreed.  The migraines are still an issue, but patient is showing more determination and effort in trying to make more gains and feel better overall.  Has been gaining more understanding of the messages and thoughts that lead to accentuate anxiety for her, and that understanding I believe is helping her a lot at this point. She expresses more hope of getting better and seems to be more steadily working on goal-directed behavior to make progress.   Goal review and progress/challenges noted with patient.  Next appt within 2-3 weeks.  Shanon Ace, LCSW

## 2020-07-26 ENCOUNTER — Encounter: Payer: Self-pay | Admitting: Physical Therapy

## 2020-07-26 ENCOUNTER — Ambulatory Visit: Payer: 59 | Attending: Neurology | Admitting: Physical Therapy

## 2020-07-26 DIAGNOSIS — M542 Cervicalgia: Secondary | ICD-10-CM | POA: Insufficient documentation

## 2020-07-26 DIAGNOSIS — R293 Abnormal posture: Secondary | ICD-10-CM | POA: Diagnosis not present

## 2020-07-26 DIAGNOSIS — R2681 Unsteadiness on feet: Secondary | ICD-10-CM | POA: Diagnosis not present

## 2020-07-26 DIAGNOSIS — G44221 Chronic tension-type headache, intractable: Secondary | ICD-10-CM | POA: Diagnosis not present

## 2020-07-26 DIAGNOSIS — R42 Dizziness and giddiness: Secondary | ICD-10-CM | POA: Diagnosis not present

## 2020-07-26 NOTE — Therapy (Signed)
[facility] [address] [place], [place], [address] Phone: [phone]   Fax:  [phone]  Physical Therapy Treatment and D/C Summary  Patient Details  Name: Melanie Jordan MRN: [id number] Date of Birth: 22-Oct-1991 Referring Provider (PT): Ray Church, MD   Encounter Date: 07/26/2020   PT End of Session - 07/26/20 0925    Visit Number 22    Number of Visits 22    Date for PT Re-Evaluation 07/26/20    Authorization Type [employer] Employee - UMR    PT Start Time [phone]    PT Stop Time [phone]    PT Time Calculation (min) 35 min    Activity Tolerance Patient tolerated treatment well    Behavior During Therapy [facility] for tasks assessed/performed           Past Medical History:  Diagnosis Date   ADHD    now off Adderal    Allergy    Anxiety    Chronic head pain    taking Metoprolol   GERD (gastroesophageal reflux disease)    IBS (irritable bowel syndrome)    Migraine     Past Surgical History:  Procedure Laterality Date   ESOPHAGEAL MANOMETRY N/A 10/13/2019   Procedure: ESOPHAGEAL MANOMETRY (EM);  Surgeon: Mauri Pole, MD;  Location: [facility];  Service: Endoscopy;  Laterality: N/A;    There were no vitals filed for this visit.   Subjective Assessment - 07/26/20 0853    Subjective Feeling good today.  No issues to report.  Symptoms still get a little worse when really busy but not too much.    Pertinent History migraines, ADHD, generalized anxiety disorder, IBS, B12 deficiency    Diagnostic tests cervical MRI, LUE nerve conduction study - all WFL    Patient Stated Goals decrease HA and dizziness    Currently in Pain? No/denies    Pain Onset In the past 7 days              [facility] PT Assessment - 07/26/20 0859      Observation/Other Assessments   Focus on Therapeutic Outcomes (FOTO)  93% function    Other Surveys  Dizziness Handicap Inventory (DHI)    Dizziness  Handicap Inventory (DHI)  18 mild      AROM   AROM Assessment Site Cervical    Cervical Flexion 80    Cervical Extension 70    Cervical - Right Side Bend 55    Cervical - Left Side Bend 55    Cervical - Right Rotation 80    Cervical - Left Rotation 80      Functional Gait  Assessment   Gait assessed  Yes    Gait Level Surface Walks 20 ft in less than 5.5 sec, no assistive devices, good speed, no evidence for imbalance, normal gait pattern, deviates no more than 6 in outside of the 12 in walkway width.    Change in Gait Speed Able to smoothly change walking speed without loss of balance or gait deviation. Deviate no more than 6 in outside of the 12 in walkway width.    Gait with Horizontal Head Turns Performs head turns smoothly with no change in gait. Deviates no more than 6 in outside 12 in walkway width    Gait with Vertical Head Turns Performs head turns with no change in gait. Deviates no more than 6 in outside 12 in walkway width.    Gait and Pivot Turn Pivot turns  safely within 3 sec and stops quickly with no loss of balance.    Step Over Obstacle Is able to step over 2 stacked shoe boxes taped together (9 in total height) without changing gait speed. No evidence of imbalance.    Gait with Narrow Base of Support Is able to ambulate for 10 steps heel to toe with no staggering.    Gait with Eyes Closed Walks 20 ft, no assistive devices, good speed, no evidence of imbalance, normal gait pattern, deviates no more than 6 in outside 12 in walkway width. Ambulates 20 ft in less than 7 sec.    Ambulating Backwards Walks 20 ft, no assistive devices, good speed, no evidence for imbalance, normal gait    Steps Alternating feet, no rail.    Total Score 30    FGA comment: 30/30                                 PT Education - 07/26/20 0924    Education Details goals met, D/C today, continue HEP    Person(s) Educated Patient    Methods Explanation    Comprehension  Verbalized understanding            PT Short Term Goals - 05/22/20 2020      PT SHORT TERM GOAL #1   Title = LTG             PT Long Term Goals - 07/26/20 0857      PT LONG TERM GOAL #1   Title Pt will demonstrate independence with final vestibular, balance and aerobic HEP    Baseline --    Time 4    Period Weeks    Status Achieved      PT LONG TERM GOAL #2   Title Pt will demonstrate no falls risk during gait as indicated by score of 30/30 on FGA    Baseline 30/30    Time 4    Period Weeks    Status Achieved      PT LONG TERM GOAL #3   Title Pt will report 50% reduction in tension HA and swaying sensation when taking a shower, when at work or when sitting still in her car    Baseline 2-3x/week; >50% improvement    Time 4    Period Weeks    Status Achieved      PT LONG TERM GOAL #5   Title FOTO will remain >79%; [place] will decrease by 10 more points    Baseline FOTO: 93%; DHI: 18    Time 4    Period Weeks    Status Achieved                 Plan - 07/26/20 0925    Clinical Impression Statement Pt has made excellent progress and has met all LTG.  She reports significant decrease in migraines with medical management and has been able to participate in vestibular training resulting in improvement in balance while walking, decreased falls risk, decreased motion sensitivity, increased neck ROM, and increase in overall function.  Pt is safe for D/C today.    Comorbidities migraines, ADHD, generalized anxiety disorder, IBS, B12 deficiency, not currently taking medication to manage migraines - previous medication had too strong side effects    Examination-Activity Limitations Locomotion Level;Stand;Sit    Examination-Participation Restrictions Cleaning;Community Activity;Driving;Shop    Rehab Potential Good    PT Frequency 1x / week  PT Duration 4 weeks    PT Treatment/Interventions ADLs/Self Care Home Management;Canalith Repostioning;Cryotherapy;Moist Heat;Gait  training;Stair training;Functional mobility training;Therapeutic activities;Therapeutic exercise;Balance training;Neuromuscular re-education;Patient/family education;Manual techniques;Passive range of motion;Dry needling;Vestibular    PT Home Exercise Plan Medbridge Access Code: [email or web address] for aquatic HEP    Consulted and Agree with Plan of Care Patient           Patient will benefit from skilled therapeutic intervention in order to improve the following deficits and impairments:  Dizziness, Pain, Postural dysfunction, Decreased range of motion, Decreased balance, Difficulty walking  Visit Diagnosis: Dizziness and giddiness  Unsteadiness on feet  Cervicalgia  Chronic tension-type headache, intractable  Abnormal posture     Problem List Patient Active Problem List   Diagnosis Date Noted   Hypermobility syndrome 03/28/2020   Neck pain 03/28/2020   Dysphagia    Anterior chest wall pain 08/12/2019   Dizziness on standing 05/24/2019   Rapid palpitations 05/24/2019   Adjustment disorder with anxious mood 04/02/2019   Initiation of oral contraception 09/21/2018    PHYSICAL THERAPY DISCHARGE SUMMARY  Visits from Start of Care: 22  Current functional level related to goals / functional outcomes: See LTG achievement and impression statement above.   Remaining deficits: Mild dizziness with quick head movements   Education / Equipment: HEP  Plan: Patient agrees to discharge.  Patient goals were met. Patient is being discharged due to meeting the stated rehab goals.  ?????     Rico Junker, PT, DPT 07/26/20    9:32 AM   [facility] [address] [place], [place], [address] Phone: [phone]   Fax:  [phone]  Name: TOBIN CADIENTE MRN: [id number] Date of Birth: 09/05/1991

## 2020-08-03 IMAGING — MR MR CERVICAL SPINE W/O CM
5 series · 41 of 48 positions shown · non-contrast
Comparison: CT neck from 06/30/2019

CLINICAL DATA: Neck pain with numbness and heaviness in the left
arm over the last 2 months.

EXAM:
MRI CERVICAL SPINE WITHOUT CONTRAST
TECHNIQUE: Multiplanar, multisequence MR imaging of the cervical spine was
performed. No intravenous contrast was administered.

[Series 5: T2 · sagittal · 3.0mm · 0.62mm/px · 6 of 15 slices shown (1 of 2)]
[im 1/15]
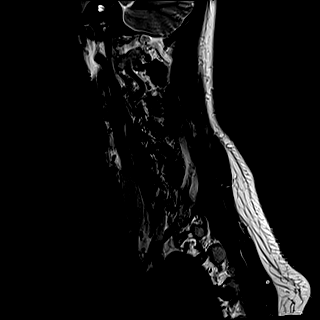
[im 3/15]
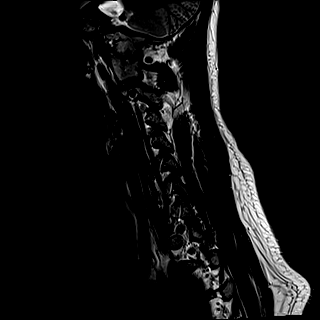
[im 6/15]
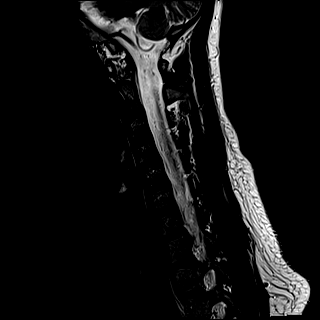
[im 9/15]
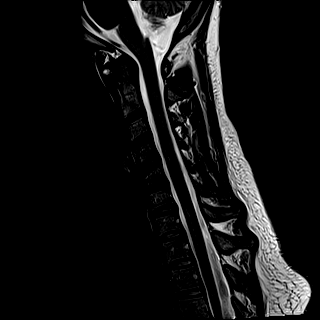
[im 12/15]
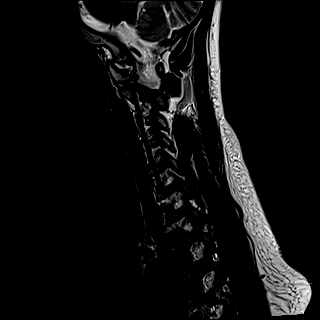
[im 15/15]
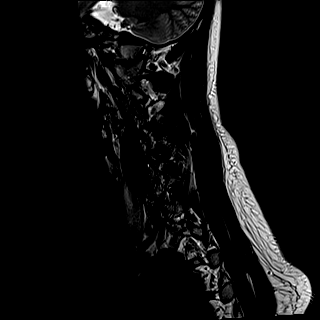

[Series 6: FLAIR · sagittal · 3.0mm · 0.78mm/px · 7 of 15 slices shown]
[im 1/15]
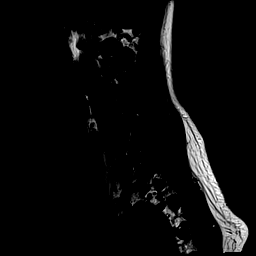
[im 3/15]
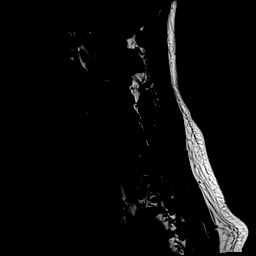
[im 5/15]
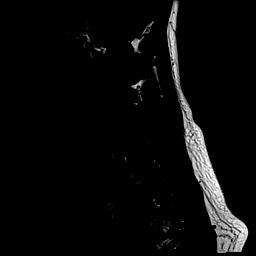
[im 8/15]
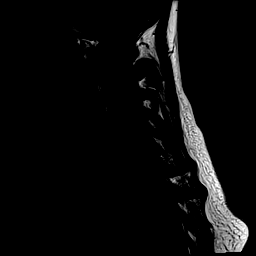
[im 10/15]
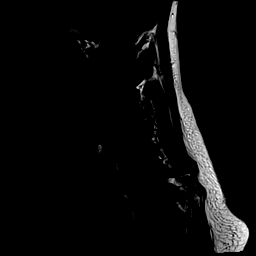
[im 12/15]
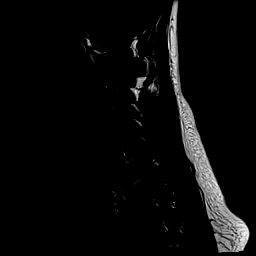
[im 15/15]
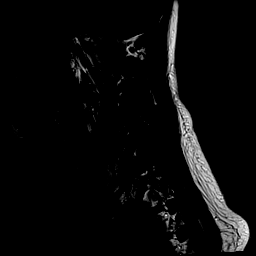

[Series 7: STIR · sagittal · 3.0mm · 0.62mm/px · 7 of 15 slices shown]
[im 1/15]
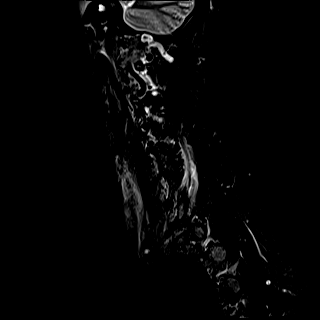
[im 3/15]
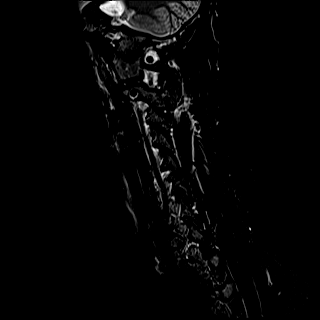
[im 5/15]
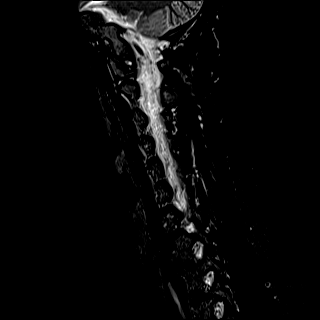
[im 8/15]
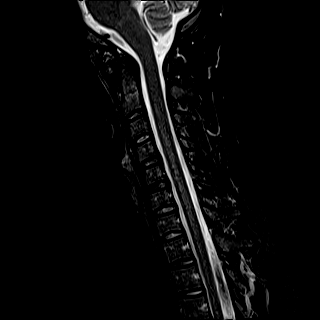
[im 10/15]
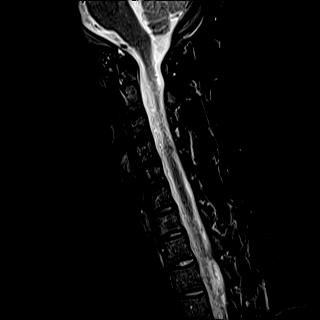
[im 12/15]
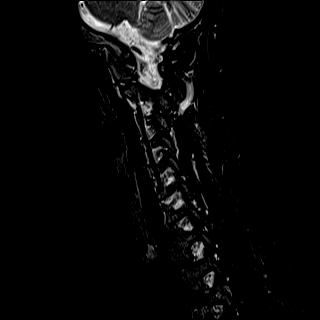
[im 15/15]
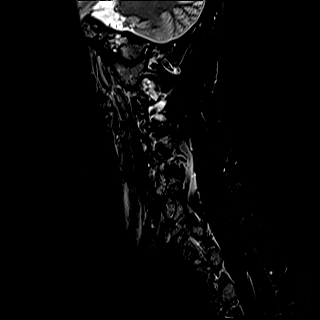

[Series 8: T2 · axial · 3.0mm · 0.70mm/px · z∈[-114,-17]mm · 13 of 29 slices shown (2 of 2)]
[im 1/29]
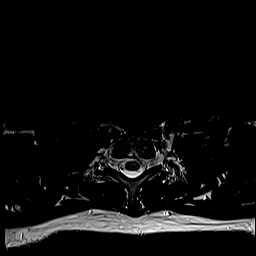
[im 3/29]
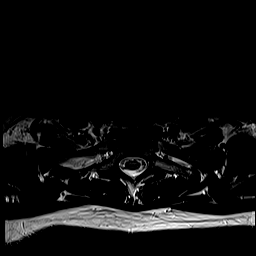
[im 5/29]
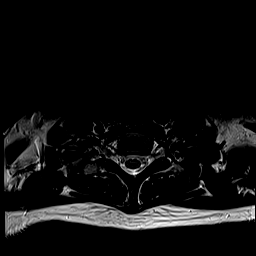
[im 7/29]
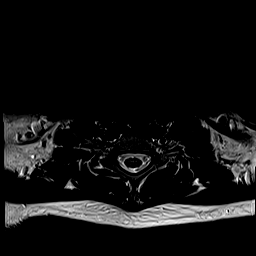
[im 9/29]
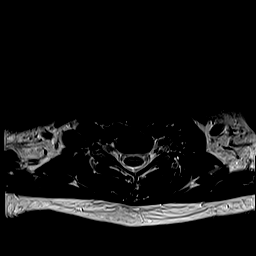
[im 11/29]
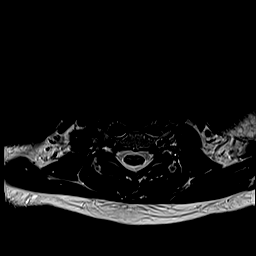
[im 13/29]
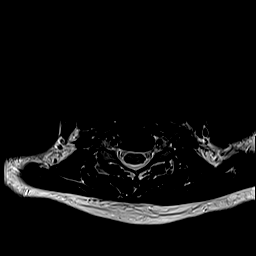
[im 16/29]
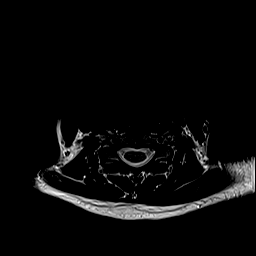
[im 18/29]
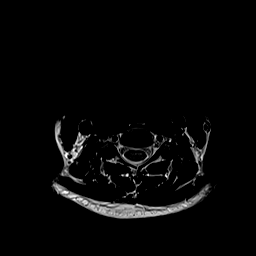
[im 20/29]
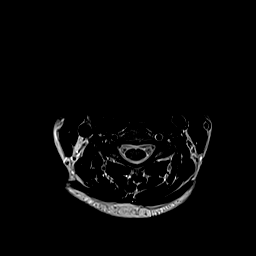
[im 22/29]
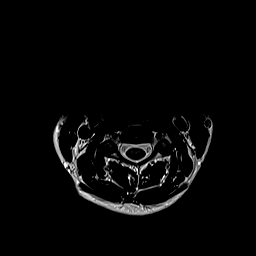
[im 24/29]
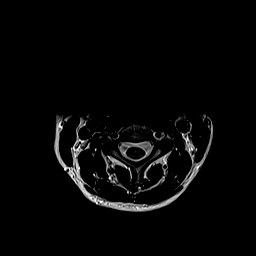
[im 29/29]
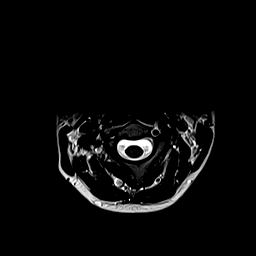

[Series 9: ax mpgr · axial · 3.0mm · 0.35mm/px · z∈[-114,-17]mm · 8 of 29 slices shown]
[im 1/29]
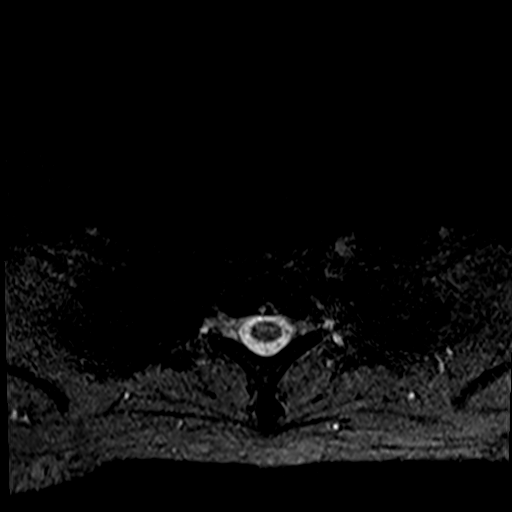
[im 5/29]
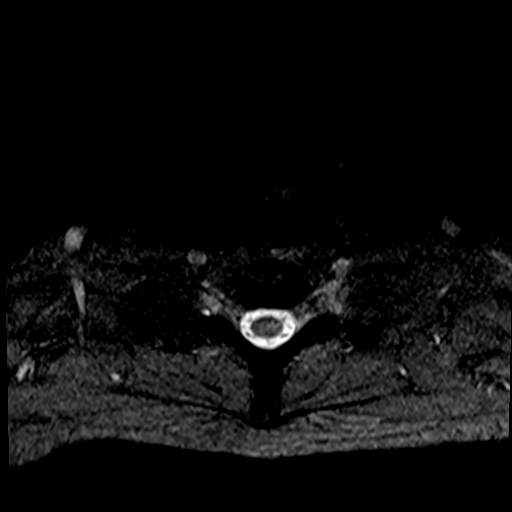
[im 9/29]
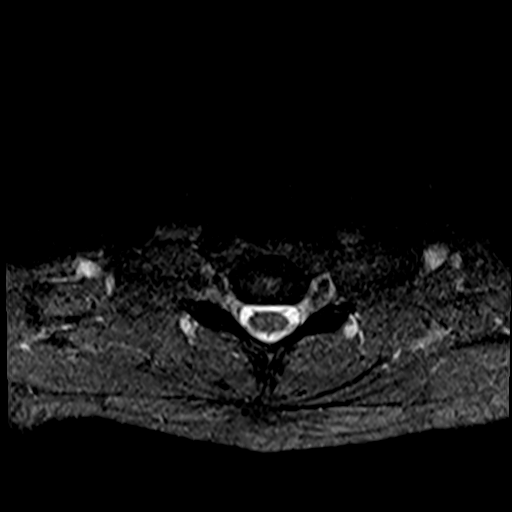
[im 13/29]
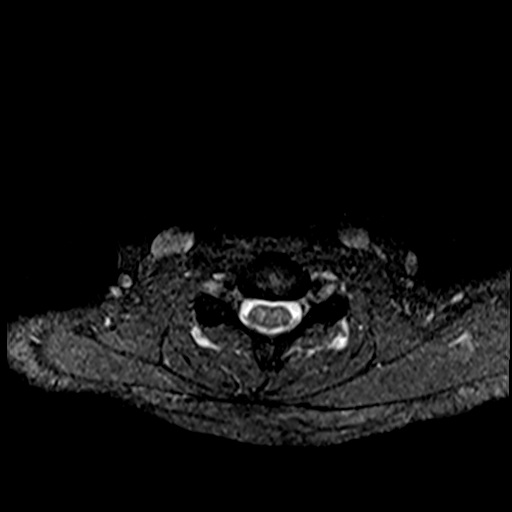
[im 16/29]
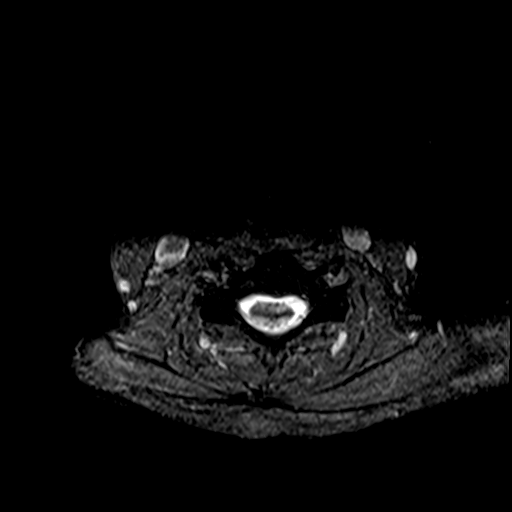
[im 20/29]
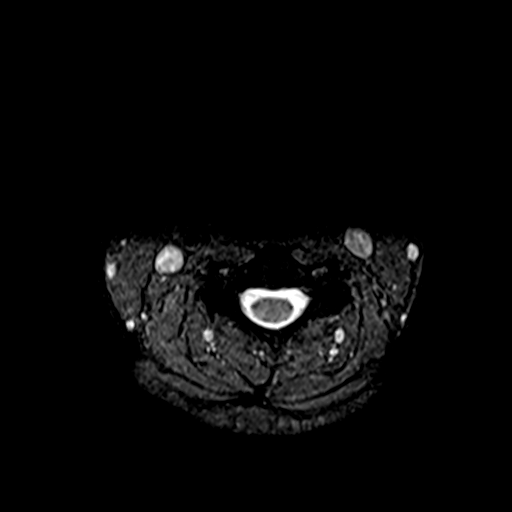
[im 24/29]
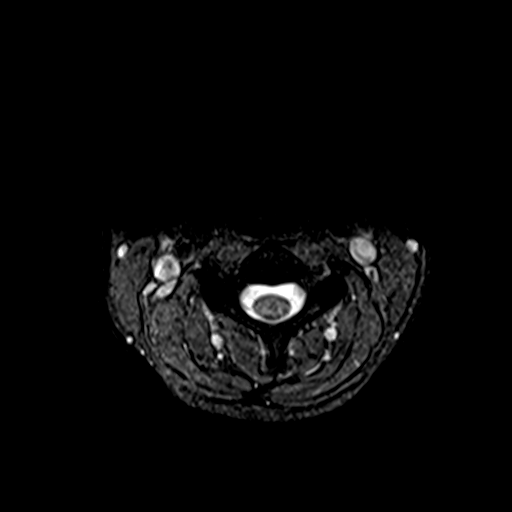
[im 29/29]
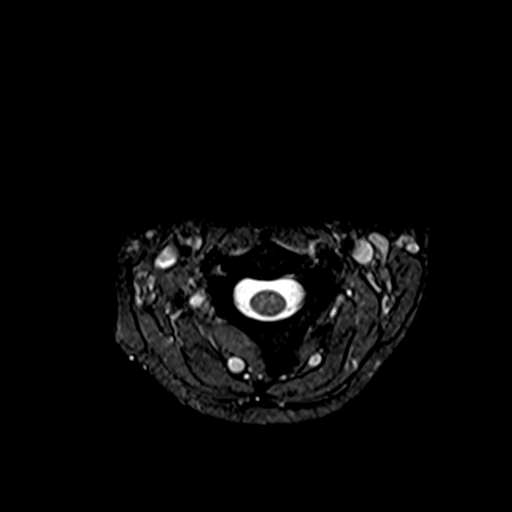

[41 of 48 positions shown; findings below may reference images not displayed]

FINDINGS: Alignment: Loss of the normal cervical lordosis, which can be
associated with muscle spasm. No subluxation.

Vertebrae: No significant vertebral marrow edema is identified.

Cord: No significant abnormal spinal cord signal is observed.

Posterior Fossa, vertebral arteries, paraspinal tissues:
Unremarkable

Disc levels:

C2-3: Unremarkable.

C3-4: Unremarkable.

C4-5: Unremarkable.

C5-6: Unremarkable.

C6-7: Unremarkable.

C7-T1: Unremarkable.
IMPRESSION: 1. Loss of the normal cervical lordosis, which can be associated
with muscle spasm.
2. Otherwise, no significant abnormality is identified to explain
the patient's symptoms.

## 2020-08-05 IMAGING — US US BREAST*R* LIMITED INC AXILLA
1 series · 3 of 3 positions shown · non-contrast
Comparison: None

CLINICAL DATA: 28-year-old female with thickening and pain in the
LOWER RIGHT breast discovered on self-examination.

EXAM:
ULTRASOUND OF THE RIGHT BREAST

[Series 1: us breast*right* limited inc axilla · 0.06mm/px · 3 of 3 slices shown]
[im 1/3]
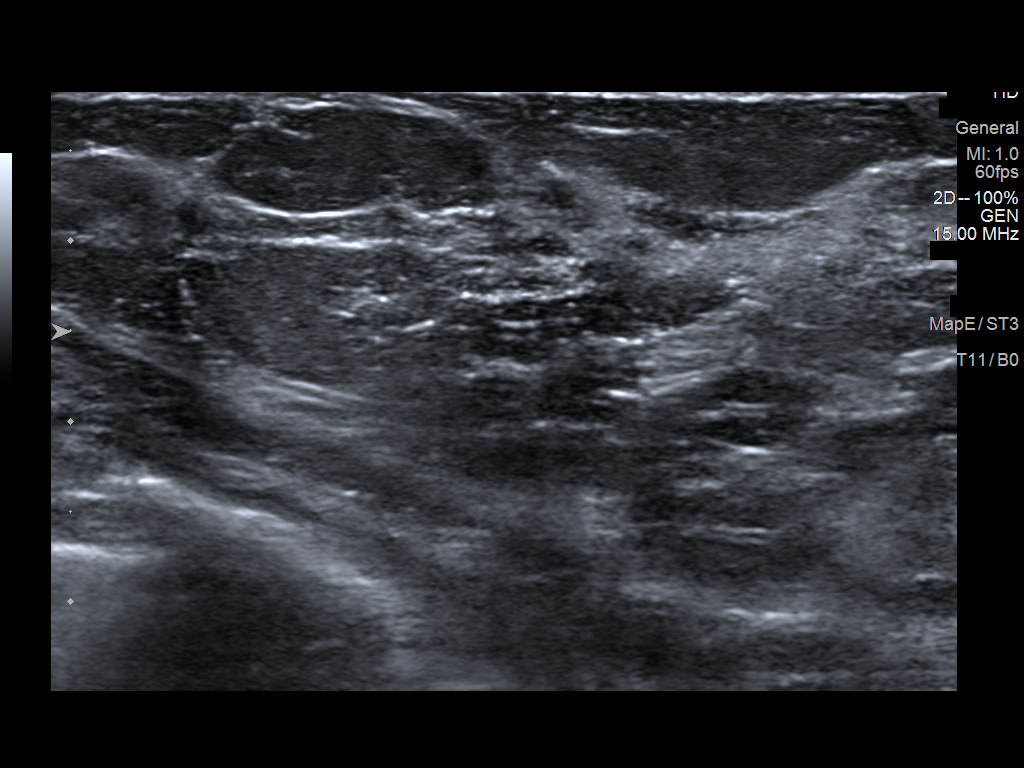
[im 2/3]
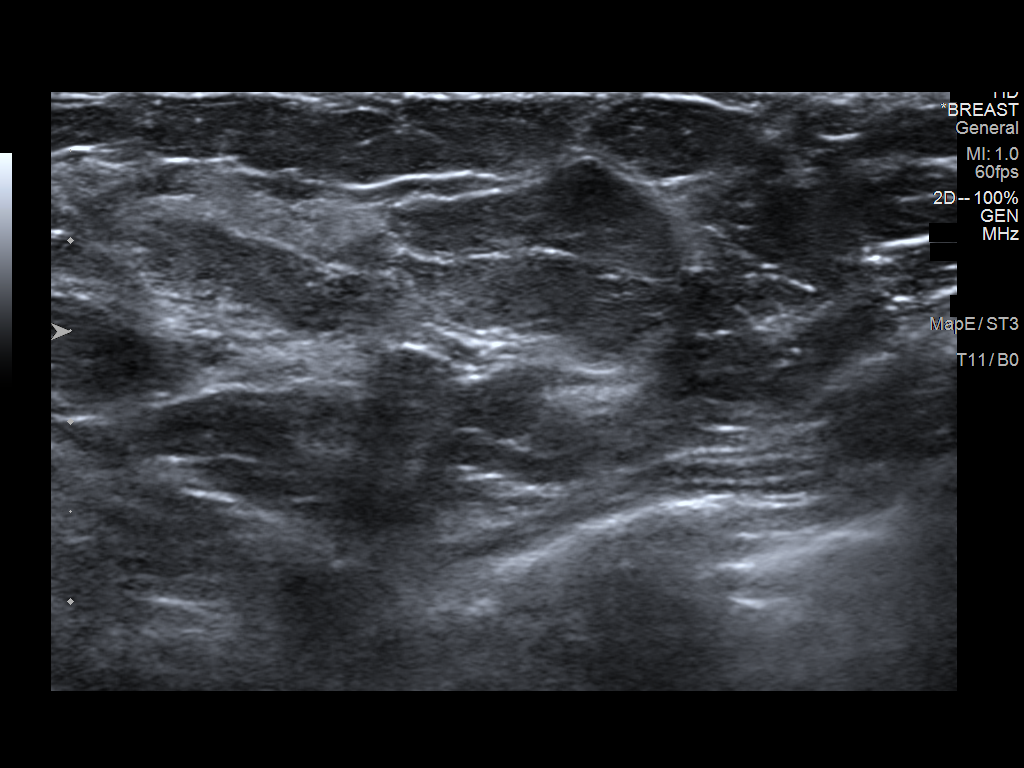
[im 3/3]
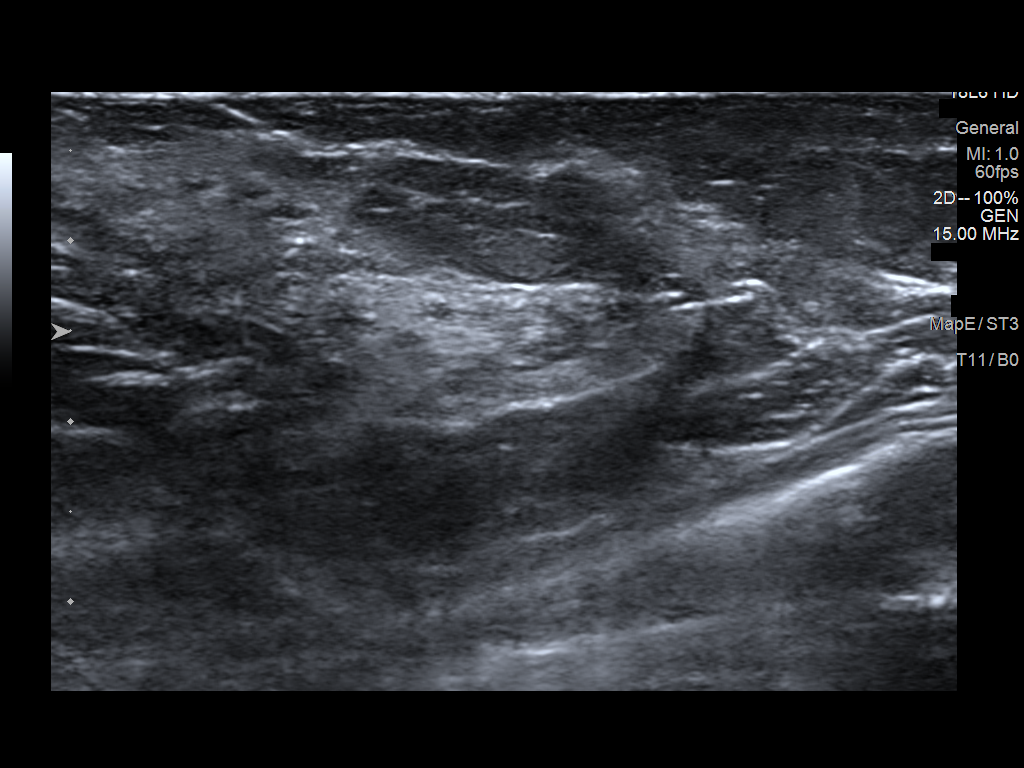

[3 of 3 positions shown; findings below may reference images not displayed]

FINDINGS: Targeted ultrasound is performed, showing no solid or cystic mass,
distortion or abnormal shadowing within the LOWER RIGHT breast, in
the area of pain. Normal dense fibroglandular tissue in this area
noted.
IMPRESSION: No sonographic abnormalities within the LOWER RIGHT breast, in the
area of patient concern.

RECOMMENDATION:
Bilateral screening mammogram at age 42.

I have discussed the findings and recommendations with the patient.
If applicable, a reminder letter will be sent to the patient
regarding the next appointment.

BI-RADS CATEGORY  1: Negative.

## 2020-08-07 DIAGNOSIS — M542 Cervicalgia: Secondary | ICD-10-CM | POA: Diagnosis not present

## 2020-08-07 DIAGNOSIS — R1310 Dysphagia, unspecified: Secondary | ICD-10-CM

## 2020-08-07 DIAGNOSIS — Z79899 Other long term (current) drug therapy: Secondary | ICD-10-CM | POA: Diagnosis not present

## 2020-08-07 DIAGNOSIS — M5481 Occipital neuralgia: Secondary | ICD-10-CM | POA: Diagnosis not present

## 2020-08-07 DIAGNOSIS — F419 Anxiety disorder, unspecified: Secondary | ICD-10-CM | POA: Diagnosis not present

## 2020-08-07 DIAGNOSIS — G43809 Other migraine, not intractable, without status migrainosus: Secondary | ICD-10-CM | POA: Diagnosis not present

## 2020-08-07 DIAGNOSIS — F4010 Social phobia, unspecified: Secondary | ICD-10-CM | POA: Diagnosis not present

## 2020-08-07 DIAGNOSIS — F902 Attention-deficit hyperactivity disorder, combined type: Secondary | ICD-10-CM | POA: Diagnosis not present

## 2020-08-07 MED FILL — ATOMOXETINE HCL 25 MG CAPS: 25 | 34 days supply | Qty: 60 | Fill #0

## 2020-08-09 ENCOUNTER — Encounter: Payer: Self-pay | Admitting: Family Medicine

## 2020-08-09 ENCOUNTER — Other Ambulatory Visit: Payer: Self-pay

## 2020-08-09 ENCOUNTER — Ambulatory Visit (INDEPENDENT_AMBULATORY_CARE_PROVIDER_SITE_OTHER): Payer: 59 | Admitting: Psychiatry

## 2020-08-09 DIAGNOSIS — F411 Generalized anxiety disorder: Secondary | ICD-10-CM | POA: Diagnosis not present

## 2020-08-09 NOTE — Progress Notes (Signed)
      [facility] Counselor/Therapist Progress Note  Patient ID: Melanie Jordan, MRN: [id number],    Date: 08/09/2020  Time Spent: 60 minutes   8:00am to 9:00am  Treatment Type: Individual Therapy  Reported Symptoms: anxiety, some obsessive thoughts, easy to anger recently  Mental Status Exam:  Appearance:   Casual     Behavior:  Appropriate, Sharing and Motivated  Motor:  Normal  Speech/Language:   Normal Rate  Affect:  anxious  Mood:  anxious  Thought process:  goal directed  Thought content:    some obsessiveness  Sensory/Perceptual disturbances:    WNL  Orientation:  oriented to person, place, time/date, situation, day of week, month of year and year  Attention:  Good  Concentration:  Fair  Memory:  [place] of knowledge:   Good  Insight:    Fair  Judgment:   Good and Fair  Impulse Control:  Good and Fair   Risk Assessment: Danger to Self:  No Self-injurious Behavior: No Danger to Others: No Duty to Warn:no Physical Aggression / Violence:No  Access to Firearms a concern: No  Gang Involvement:No   Subjective:  Patient today reports anxiety, especially anxious about possible upcoming trip in October, and concerned re: recent obsessive thoughts.  Interventions: Cognitive Behavioral Therapy, Solution-Oriented/Positive Psychology and Ego-Supportive  Diagnosis:   ICD-10-CM   1. Generalized anxiety disorder  F41.1     Plan of Care: Patient not signing Tx Plan on computer screen due to COVID-19.  Treatment Goals: Goals will remain on tx plan as patient works on strategies to achieve her goals. Progress will be noted each session in "Progress" section of the Plan.  Long term goal: Reduce overall level, frequency, and intensity of the anxiety so that daily functioning is not impaired. *Will self-rate herself as a "3 or under" for a period of at least 3 months.  Short term goal: Verbalize an understanding of the role  thatanxious/negative/depressivehinking plays in creating fears, excessive worry, and persistent anxiety symptoms.  Strategy: Identify, challenge, and replaceanxious/fearfulthoughts andself-talk with positive, realistic, and empoweringthought andself-talkthat do not support anxiety nor fearfulness.  Progress: Patient in today and reports anxiety, some anger recently, obsessiveness, fears, "really stressed right now because of my current job, my new part time job, upcoming trip to [place] state and Covid, and issues in relationship with S.O. Covid worries are not quite as strong.  Lots of anxious thoughts and irritable at times and feels that the increased stress recently has taken a toll on her and her thinking.  Worked in session today on her heightened stress and anxious thoughts, as they have increased more recently. Per Strategy above in goal plan, we worked on her identifying, challenging, and replacing her anxious thoughts and self-talk with more encouraging, reality-based, and empowering thoughts and self-talk that do not support anxiety and fear.  She is to continue this between sessions.   Goal review and progress/challenges noted with patient.  Next appt within 3 weeks.   Shanon Ace, LCSW

## 2020-08-15 DIAGNOSIS — H527 Unspecified disorder of refraction: Secondary | ICD-10-CM | POA: Diagnosis not present

## 2020-08-15 DIAGNOSIS — H04123 Dry eye syndrome of bilateral lacrimal glands: Secondary | ICD-10-CM | POA: Diagnosis not present

## 2020-08-15 DIAGNOSIS — H40039 Anatomical narrow angle, unspecified eye: Secondary | ICD-10-CM | POA: Diagnosis not present

## 2020-08-22 ENCOUNTER — Encounter: Payer: 59 | Admitting: Psychiatry

## 2020-08-22 NOTE — Progress Notes (Deleted)
      [facility] Counselor/Therapist Progress Note  Patient ID: Melanie Jordan, MRN: [id number],    Date: 08/22/2020  Time Spent:   Treatment Type: Individual Therapy  Reported Symptoms:   Mental Status Exam:  Appearance:   Neat     Behavior:  {PSY:21022743}  Motor:  {PSY:22302}  Speech/Language:   {PSY:22685}  Affect:  {PSY:22687}  Mood:  {PSY:31886}  Thought process:  {PSY:31888}  Thought content:    {[phone]}  Sensory/Perceptual disturbances:    {PSY:[phone]}  Orientation:  oriented to person, place, time/date, situation, day of week, month of year and year  Attention:  {PSY:22877}  Concentration:  {PSY:516-573-1641}  Memory:  {PSY:[phone]}  Fund of knowledge:   {PSY:516-573-1641}  Insight:    {PSY:516-573-1641}  Judgment:   {PSY:516-573-1641}  Impulse Control:  {PSY:516-573-1641}   Risk Assessment: Danger to Self:  No Self-injurious Behavior: No Danger to Others: No Duty to Warn:no Physical Aggression / Violence:No  Access to Firearms a concern: No  Gang Involvement:No   Subjective:        Interventions: Solution-Oriented/Positive Psychology and Ego-Supportive  Diagnosis:   ICD-10-CM   1. Generalized anxiety disorder  F41.1     Plan:    Shanon Ace, LCSW                   This encounter was created in error - please disregard.

## 2020-08-24 NOTE — Progress Notes (Signed)
This encounter was created in error - please disregard.

## 2020-09-05 ENCOUNTER — Ambulatory Visit: Payer: 59 | Admitting: Psychiatry

## 2020-09-08 ENCOUNTER — Encounter: Payer: Self-pay | Admitting: Family Medicine

## 2020-09-13 ENCOUNTER — Other Ambulatory Visit: Payer: Self-pay | Admitting: Family Medicine

## 2020-09-13 ENCOUNTER — Encounter: Payer: Self-pay | Admitting: Family Medicine

## 2020-09-13 DIAGNOSIS — R399 Unspecified symptoms and signs involving the genitourinary system: Secondary | ICD-10-CM

## 2020-09-13 DIAGNOSIS — Z30011 Encounter for initial prescription of contraceptive pills: Secondary | ICD-10-CM

## 2020-09-14 ENCOUNTER — Encounter: Payer: Self-pay | Admitting: Family Medicine

## 2020-09-14 ENCOUNTER — Other Ambulatory Visit (INDEPENDENT_AMBULATORY_CARE_PROVIDER_SITE_OTHER): Payer: 59

## 2020-09-14 DIAGNOSIS — R399 Unspecified symptoms and signs involving the genitourinary system: Secondary | ICD-10-CM | POA: Diagnosis not present

## 2020-09-14 LAB — URINALYSIS
Bilirubin Urine: NEGATIVE
Hgb urine dipstick: NEGATIVE
Ketones, ur: NEGATIVE
Leukocytes,Ua: NEGATIVE
Nitrite: NEGATIVE
Specific Gravity, Urine: <=1.005 — AB (ref 1.000–1.030)
Total Protein, Urine: NEGATIVE
Urine Glucose: NEGATIVE
Urobilinogen, UA: 0.2 (ref 0.0–1.0)
pH: 6 (ref 5.0–8.0)

## 2020-09-15 LAB — URINE CULTURE
MICRO NUMBER:: 11101404
Result:: NO GROWTH
SPECIMEN QUALITY:: ADEQUATE

## 2020-09-19 ENCOUNTER — Ambulatory Visit: Payer: 59 | Admitting: Psychiatry

## 2020-10-03 ENCOUNTER — Other Ambulatory Visit: Payer: Self-pay

## 2020-10-03 ENCOUNTER — Ambulatory Visit (INDEPENDENT_AMBULATORY_CARE_PROVIDER_SITE_OTHER): Payer: 59 | Admitting: Psychiatry

## 2020-10-03 DIAGNOSIS — F411 Generalized anxiety disorder: Secondary | ICD-10-CM | POA: Diagnosis not present

## 2020-10-03 NOTE — Progress Notes (Signed)
[facility] Counselor/Therapist Progress Note  Patient ID: Melanie Jordan, MRN: [id number],    Date: 10/03/2020  Time Spent: 60 minutes   10:00am to 11:00am  Treatment Type: Individual Therapy  Reported Symptoms: anxiety,some depression  Mental Status Exam:  Appearance:   Casual     Behavior:  Appropriate, Sharing and Motivated  Motor:  Normal  Speech/Language:   Clear and Coherent  Affect:  anxious  Mood:  anxious and depressed  Thought process:  goal directed  Thought content:    some obsessiveness  Sensory/Perceptual disturbances:    WNL  Orientation:  oriented to person, place, time/date, situation, day of week, month of year and year  Attention:  Good  Concentration:  Fair  Memory:  WNL  Fund of knowledge:   Good  Insight:    Good  Judgment:   Good  Impulse Control:  Fair   Risk Assessment: Danger to Self:  No Self-injurious Behavior: No Danger to Others: No Duty to Warn:no Physical Aggression / Violence:No  Access to Firearms a concern: No  Gang Involvement:No   Subjective: Patient today reports anxiety and some depression.  Got a part time job to help her with finances. Feels that she is managing some of her anxiety symptoms better.   Interventions: Cognitive Behavioral Therapy and Solution-Oriented/Positive Psychology  Diagnosis:   ICD-10-CM   1. Generalized anxiety disorder  F41.1      Plan of Care: Patient not signing Tx Plan on computer screen due to COVID-19.  Treatment Goals: Goals will remain on tx plan as patient works on strategies to achieve her goals. Progress will be noted each session in "Progress" section of the Plan.  Long term goal: Reduce overall level, frequency, and intensity of the anxiety so that daily functioning is not impaired. *Will self-rate herself as a "3 or under" for a period of at least 3 months.  Short term goal: Verbalize an understanding of the role thatanxious/negative/depressivehinking plays  in creating fears, excessive worry, and persistent anxiety symptoms.  Strategy: Identify, challenge, and replaceanxious/fearfulthoughts andself-talk with positive, realistic, and empoweringthought andself-talkthat do not support anxiety nor fearfulness.  Progress: Patient in today reporting anxiety and some depression.  Also notes some progress in managing anxiety symptoms "at times." Physical symptoms that impacted her anxiety a lot, have decreased some, although not totally gone. Trying not to drink as much caffeine as previously as it can exacerbate some of her physical symptoms. Trip to [place] to see family went ok.  Shares that her Covid fears have lessened some. Irritability has decreased some.  Still having anxious thoughts but not as pervasive as in earlier months. On 1-10 scale for anxiety, today she rates herself a "4".  On depression scale, she rates herself as a "2-3".  Good noticeable progress as patient continues to work with her long-term goal, short-term goal, and strategy all noted in her treatment plan above.  Worked today in session, through some job stressors and discussed some issues within her relationship with BF.  Feeling encouraged with some of her progress and we discussed ways for her to maintain that progress and build on it going forward.  Encouraged overall good self-care, emotionally and physically, to stay in touch with people who are supportive of her, to work further on challenging her anxious thoughts and replacing them with more realistic and empowering thoughts, to look more for the positives versus the negatives, and making self talk more positive.  Goal review and progress/challenges noted with  patient.  Next appointment within 3 weeks.   Shanon Ace, LCSW

## 2020-10-11 ENCOUNTER — Other Ambulatory Visit: Payer: Self-pay | Admitting: Psychiatry

## 2020-10-11 DIAGNOSIS — F411 Generalized anxiety disorder: Secondary | ICD-10-CM

## 2020-10-11 DIAGNOSIS — F064 Anxiety disorder due to known physiological condition: Secondary | ICD-10-CM

## 2020-10-12 NOTE — Telephone Encounter (Signed)
Last refill 10/07/2019  Last apt 12/2019

## 2020-10-12 NOTE — Telephone Encounter (Signed)
Pt is scheduled for 1/5

## 2020-10-17 ENCOUNTER — Ambulatory Visit: Payer: 59 | Admitting: Psychiatry

## 2020-11-07 ENCOUNTER — Other Ambulatory Visit: Payer: Self-pay

## 2020-11-07 ENCOUNTER — Encounter: Payer: Self-pay | Admitting: Cardiology

## 2020-11-07 ENCOUNTER — Ambulatory Visit (INDEPENDENT_AMBULATORY_CARE_PROVIDER_SITE_OTHER): Payer: 59 | Admitting: Cardiology

## 2020-11-07 VITALS — BP 110/74 | HR 77 | Ht 67.0 in | Wt 165.0 lb

## 2020-11-07 DIAGNOSIS — R002 Palpitations: Secondary | ICD-10-CM | POA: Diagnosis not present

## 2020-11-07 DIAGNOSIS — R42 Dizziness and giddiness: Secondary | ICD-10-CM | POA: Diagnosis not present

## 2020-11-07 DIAGNOSIS — R0789 Other chest pain: Secondary | ICD-10-CM

## 2020-11-07 NOTE — Progress Notes (Signed)
Primary Care Provider: Ronnald Nian, DO Cardiologist: [name], MD Electrophysiologist: None  Clinic Note: Chief Complaint  Patient presents with  . Rapid palpitations    Follow-up   Problem List Items Addressed This Visit    Anterior chest wall pain    Probably musculoskeletal in nature. She had chest discomfort also that sounds like it was associated with anxiety.  Nonischemic treadmill stress test in 29 year old. Would not do further testing.      Dizziness on standing    Does not truly meet criteria for POTS -> recommendation was increase hydration with salt liberalization. If symptoms worsen, consider support stockings.       Rapid palpitations - Primary    Only short-lived symptoms do seem to probably be related to anxiety bradycardia probably a dehydration.  Monitor did not show any significant findings despite her having significant symptoms.  Plan: For now we will simply monitor. She did not get her as needed propranolol filled. If she has worsening symptoms, we will potentially consider refilling either propranolol versus diltiazem.  Since she is quite stable now, we will follow up in 2 years to reassess.        HPI:    Melanie Jordan is a 29 y.o. female with a PMH below who presents today for 45-month follow-up.  Initially seen for near syncope and vomiting.  ER visit with sharp left-sided chest pain rapid heart rate.  She was found to be hypokalemic.  Complaints as follows:  Rapid heart rate episodes associate with dizziness and chest tightness; feeling pale and sweaty with near syncope.  Almost blacked out on 1 occasion but was able to get down before.  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  Cardiac Event Monitor: Showed sinus rhythm with rates of 43-166 bpm.  This was sinus tachycardia or sinus bradycardia.  No sustained arrhythmias.  Symptoms  noted with sinus rhythm and/or sinus tachycardia with occasional PVCs  In September 2020 was noting musculoskeletal costochondritis pain.    Low risk GXT in December 2020  Melanie Jordan was last seen on March 06, 2020 -> she noted that the palpitation spells were less prominent and heart rate was as fast.  Usually improved as her level of anxiety improved.  Xanax was working better than propranolol.  No changes made  Recent Hospitalizations: None  Reviewed  CV studies:    The following studies were reviewed today: (if available, images/films reviewed: From Epic Chart or Care Everywhere) . Echo 04/04/2020:: EF 60 to 65%..  Normal valves.  Normal chamber sizes.  No pericardial effusion..  Interval History:   Melanie Jordan returns here today overall doing quite well. 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  She still does have episodes of palpitations with increased heart rate but usually they are pretty well controlled. She had episode last week the last longer than usual. Usually they happen maybe once or twice a week in the last 6-10 beats. They make her want to cough and that usually seems to break the episodes. She may have some chest pain off and on-usually related to lots of anxiety. No exertional chest pain.  She never did get her as needed propranolol filled.  CV Review of Symptoms (Summary): positive for - palpitations, rapid  heart rate and Short little spells lasting less than 10 to 15 seconds-relieved with cough. Chest discomfort usually associated with a lots of anxiety or with more prolonged palpitations. negative for - dyspnea on exertion, orthopnea, paroxysmal nocturnal dyspnea, shortness of breath or Lightheadedness or dizziness, syncope/near syncope or TIA/amaurosis fugax. Claudication.  The patient does not have symptoms concerning for COVID-19 infection (fever, chills,  cough, or new shortness of breath).   REVIEWED OF SYSTEMS   Review of Systems  Constitutional: Negative for malaise/fatigue and weight loss.  HENT: Negative for congestion.   Respiratory: Negative for cough and shortness of breath.   Gastrointestinal: Negative for blood in stool and melena.  Genitourinary: Negative for hematuria.  Musculoskeletal: Negative for joint pain.  Neurological: Positive for dizziness. Negative for focal weakness.  Psychiatric/Behavioral: Negative for memory loss. The patient is nervous/anxious. The patient does not have insomnia.    I have reviewed and (if needed) personally updated the patient's problem list, medications, allergies, past medical and surgical history, social and family history.   PAST MEDICAL HISTORY   Past Medical History:  Diagnosis Date  . ADHD    now off Adderal   . Allergy   . Anxiety   . Chronic head pain    taking Metoprolol  . GERD (gastroesophageal reflux disease)   . IBS (irritable bowel syndrome)   . Migraine     PAST SURGICAL HISTORY   Past Surgical History:  Procedure Laterality Date  . ESOPHAGEAL MANOMETRY N/A 10/13/2019   Procedure: ESOPHAGEAL MANOMETRY (EM);  Surgeon: Mauri Pole, MD;  Location: [facility];  Service: Endoscopy;  Laterality: N/A;  . TRANSTHORACIC ECHOCARDIOGRAM  03/2020   EF 60 to 65%..  Normal valves.  Normal chamber sizes.  No pericardial effusion..    Immunization History  Administered Date(s) Administered  . Hpv 05/26/2015  . Influenza,inj,Quad PF,6+ Mos 08/06/2018  . Influenza-Unspecified 08/15/2019  . PFIZER SARS-COV-2 Vaccination 10/27/2019, 12/07/2019, 08/01/2020  . Tdap 05/26/2015    MEDICATIONS/ALLERGIES   Current Meds  Medication Sig  . Acetaminophen (TYLENOL PO) Take by mouth as needed.  . ALPRAZolam (XANAX) 0.25 MG tablet TAKE 1 TABLET BY MOUTH DAILY AS NEEDED FOR ANXIETY  . Cetirizine HCl (ZYRTEC PO) Take by mouth daily as needed.   . Cholecalciferol (VITAMIN D3)  25 MCG (1000 UT) CAPS Take 1 capsule by mouth daily.  . cyclobenzaprine (FLEXERIL) 5 MG tablet Take 5 mg by mouth 3 (three) times daily.  Marland Kitchen ibuprofen (ADVIL) 200 MG tablet Take 200 mg by mouth every 6 (six) hours as needed.  . Multiple Vitamins-Minerals (MULTIVITAMIN ADULTS PO) Take by mouth daily.  . promethazine (PHENERGAN) 25 MG tablet TAKE 1 TABLET (25 MG TOTAL) BY MOUTH EVERY 8 (EIGHT) HOURS AS NEEDED FOR NAUSEA FOR UP TO 90 DAYS  . Rimegepant Sulfate 75 MG TBDP Take by mouth.  . vitamin B-12 (CYANOCOBALAMIN) 1000 MCG tablet Take 1,000 mcg by mouth daily.  . [DISCONTINUED] JUNEL FE 1/20 1-20 MG-MCG tablet TAKE 1 TABLET BY MOUTH EVERY DAY    No Known Allergies  SOCIAL HISTORY/FAMILY HISTORY   Reviewed in Epic:  Pertinent findings:   She is started working a second job at a (non-[employer] - get better pay & feels more appreciated)  OBJCTIVE -PE, EKG, labs   Wt Readings from Last 3 Encounters:  11/08/20 167 lb 9.6 oz (76 kg)  11/07/20 165 lb (74.8 kg)  03/28/20 164 lb (74.4 kg)    Physical Exam: BP 110/74  Pulse 77   Ht 5\' 7"  (1.702 m)   Wt 165 lb (74.8 kg)   SpO2 99%   BMI 25.84 kg/m  Physical Exam Constitutional:      General: She is not in acute distress.    Appearance: Normal appearance. She is normal weight. She is not ill-appearing or toxic-appearing.  HENT:     Head: Normocephalic and atraumatic.  Neck:     Vascular: No carotid bruit.  Cardiovascular:     Rate and Rhythm: Normal rate and regular rhythm.     Pulses: Normal pulses.     Heart sounds: Normal heart sounds. No murmur heard. No friction rub. No gallop.   Pulmonary:     Effort: Pulmonary effort is normal. No respiratory distress.     Breath sounds: Normal breath sounds.  Chest:     Chest wall: No tenderness.  Musculoskeletal:        General: Normal range of motion.     Cervical back: Normal range of motion.  Skin:    General: Skin is warm and dry.  Neurological:     General: No  focal deficit present.     Mental Status: She is alert and oriented to person, place, and time.  Psychiatric:        Mood and Affect: Mood normal.        Behavior: Behavior normal.        Thought Content: Thought content normal.        Judgment: Judgment normal.     Comments: Flat affect.  Occasionally seems to perseverate on symptoms.      Adult ECG Report N/a  Recent Labs:  No labs @ time of visit - due for check by PCP soon.   ASSESSMENT/PLAN   Problem List Items Addressed This Visit    Anterior chest wall pain    Probably musculoskeletal in nature. She had chest discomfort also that sounds like it was associated with anxiety.  Nonischemic treadmill stress test in 29 year old. Would not do further testing.      Dizziness on standing    Does not truly meet criteria for POTS -> recommendation was increase hydration with salt liberalization. If symptoms worsen, consider support stockings.       Rapid palpitations - Primary    Only short-lived symptoms do seem to probably be related to anxiety bradycardia probably a dehydration.  Monitor did not show any significant findings despite her having significant symptoms.  Plan: For now we will simply monitor. She did not get her as needed propranolol filled. If she has worsening symptoms, we will potentially consider refilling either propranolol versus diltiazem.  Since she is quite stable now, we will follow up in 2 years to reassess.          COVID-19 Education: The signs and symptoms of COVID-19 were discussed with the patient and how to seek care for testing (follow up with PCP or arrange E-visit).   The importance of social distancing and COVID-19 vaccination was discussed today. 61min The patient is practicing social distancing & Masking.   I spent a total of 26 minutes with the patient spent in direct patient consultation.  Additional time spent with chart review  / charting (studies, outside notes, etc): 6 Total  Time: 32 min -> We spent some time discussing whether not she would want to be on medication or not. She opted to avoid being on statin medication.  Current medicines are reviewed at length with the patient today.  (+/-  concerns) N/A  This visit occurred during the SARS-CoV-2 public health emergency.  Safety protocols were in place, including screening questions prior to the visit, additional usage of staff PPE, and extensive cleaning of exam room while observing appropriate contact time as indicated for disinfecting solutions.  Notice: This dictation was prepared with Dragon dictation along with smaller phrase technology. Any transcriptional errors that result from this process are unintentional and may not be corrected upon review.  Patient Instructions / Medication Changes & Studies & Tests Ordered   Patient Instructions  Medication Instructions:  No changes *If you need a refill on your cardiac medications before your next appointment, please call your pharmacy*   Lab Work:  Not needed.   Testing/Procedures: Not needed   Follow-Up: At [facility], you and your health needs are our priority.  As part of our continuing mission to provide you with exceptional heart care, we have created designated Provider Care Teams.  These Care Teams include your primary Cardiologist (physician) and Advanced Practice Providers (APPs -  Physician Assistants and Nurse Practitioners) who all work together to provide you with the care you need, when you need it.   Your next appointment:   2 year(s)  The format for your next appointment:   In Person  Provider:   [name], MD   Studies Ordered:   No orders of the defined types were placed in this encounter.  ADDENDUM  Lab Results  Component Value Date   CHOL 165 11/08/2020   HDL 47.90 11/08/2020   LDLCALC 101 (H) 11/08/2020   TRIG 81.0 11/08/2020   CHOLHDL 3 11/08/2020   Lab Results  Component Value Date   CREATININE 0.63  11/08/2020   BUN 8 11/08/2020   NA 140 11/08/2020   K 4.3 11/08/2020   CL 108 11/08/2020   CO2 25 11/08/2020   Lab Results  Component Value Date   TSH 3.49 11/08/2020   All appear to be relatively normal labs.   [name], M.D., M.S. Interventional Cardiologist   Pager # [phone] Phone # [phone] [address]. [place], [place] [address]   Thank you for choosing [facility] at [facility]!!

## 2020-11-07 NOTE — Patient Instructions (Addendum)
Medication Instructions:  No changes *If you need a refill on your cardiac medications before your next appointment, please call your pharmacy*   Lab Work:  Not needed.   Testing/Procedures: Not needed   Follow-Up: At [facility], you and your health needs are our priority.  As part of our continuing mission to provide you with exceptional heart care, we have created designated Provider Care Teams.  These Care Teams include your primary Cardiologist (physician) and Advanced Practice Providers (APPs -  Physician Assistants and Nurse Practitioners) who all work together to provide you with the care you need, when you need it.   Your next appointment:   2 year(s)  The format for your next appointment:   In Person  Provider:   [name], MD

## 2020-11-08 ENCOUNTER — Encounter: Payer: Self-pay | Admitting: Family Medicine

## 2020-11-08 ENCOUNTER — Ambulatory Visit (INDEPENDENT_AMBULATORY_CARE_PROVIDER_SITE_OTHER): Payer: 59 | Admitting: Family Medicine

## 2020-11-08 VITALS — BP 118/70 | HR 76 | Temp 98.2°F | Ht 67.0 in | Wt 167.6 lb

## 2020-11-08 DIAGNOSIS — E538 Deficiency of other specified B group vitamins: Secondary | ICD-10-CM | POA: Diagnosis not present

## 2020-11-08 DIAGNOSIS — Z0000 Encounter for general adult medical examination without abnormal findings: Secondary | ICD-10-CM | POA: Diagnosis not present

## 2020-11-08 DIAGNOSIS — Z3041 Encounter for surveillance of contraceptive pills: Secondary | ICD-10-CM | POA: Diagnosis not present

## 2020-11-08 DIAGNOSIS — Z13220 Encounter for screening for lipoid disorders: Secondary | ICD-10-CM | POA: Diagnosis not present

## 2020-11-08 DIAGNOSIS — R7989 Other specified abnormal findings of blood chemistry: Secondary | ICD-10-CM | POA: Diagnosis not present

## 2020-11-08 LAB — CBC
HCT: 40.8 % (ref 36.0–46.0)
Hemoglobin: 13.6 g/dL (ref 12.0–15.0)
MCHC: 33.3 g/dL (ref 30.0–36.0)
MCV: 91.4 fl (ref 78.0–100.0)
Platelets: 258 10*3/uL (ref 150.0–400.0)
RBC: 4.46 Mil/uL (ref 3.87–5.11)
RDW: 13 % (ref 11.5–15.5)
WBC: 3.9 10*3/uL — ABNORMAL LOW (ref 4.0–10.5)

## 2020-11-08 LAB — AST: AST: 15 U/L (ref 0–37)

## 2020-11-08 LAB — ALT: ALT: 14 U/L (ref 0–35)

## 2020-11-08 LAB — LIPID PANEL
Cholesterol: 165 mg/dL (ref 0–200)
HDL: 47.9 mg/dL (ref >39.00)
LDL Cholesterol: 101 mg/dL — ABNORMAL HIGH (ref 0–99)
NonHDL: 117.09
Total CHOL/HDL Ratio: 3
Triglycerides: 81 mg/dL (ref 0.0–149.0)
VLDL: 16.2 mg/dL (ref 0.0–40.0)

## 2020-11-08 LAB — BASIC METABOLIC PANEL
BUN: 8 mg/dL (ref 6–23)
CO2: 25 mEq/L (ref 19–32)
Calcium: 9 mg/dL (ref 8.4–10.5)
Chloride: 108 mEq/L (ref 96–112)
Creatinine, Ser: 0.63 mg/dL (ref 0.40–1.20)
GFR: 121.64 mL/min (ref >60.00)
Glucose, Bld: 87 mg/dL (ref 70–99)
Potassium: 4.3 mEq/L (ref 3.5–5.1)
Sodium: 140 mEq/L (ref 135–145)

## 2020-11-08 LAB — VITAMIN B12: Vitamin B-12: 457 pg/mL (ref 211–911)

## 2020-11-08 LAB — TSH: TSH: 3.49 u[IU]/mL (ref 0.35–4.50)

## 2020-11-08 LAB — T4, FREE: Free T4: 0.79 ng/dL (ref 0.60–1.60)

## 2020-11-08 MED ORDER — NORETHINDRONE 0.35 MG PO TABS: 1.0000 | ORAL_TABLET | Freq: Every day | ORAL | 3 refills | Status: DC

## 2020-11-08 NOTE — Patient Instructions (Signed)
Health Maintenance, Female Adopting a healthy lifestyle and getting preventive care are important in promoting health and wellness. Ask your health care provider about:  The right schedule for you to have regular tests and exams.  Things you can do on your own to prevent diseases and keep yourself healthy. What should I know about diet, weight, and exercise? Eat a healthy diet   Eat a diet that includes plenty of vegetables, fruits, low-fat dairy products, and lean protein.  Do not eat a lot of foods that are high in solid fats, added sugars, or sodium. Maintain a healthy weight Body mass index (BMI) is used to identify weight problems. It estimates body fat based on height and weight. Your health care provider can help determine your BMI and help you achieve or maintain a healthy weight. Get regular exercise Get regular exercise. This is one of the most important things you can do for your health. Most adults should:  Exercise for at least 150 minutes each week. The exercise should increase your heart rate and make you sweat (moderate-intensity exercise).  Do strengthening exercises at least twice a week. This is in addition to the moderate-intensity exercise.  Spend less time sitting. Even light physical activity can be beneficial. Watch cholesterol and blood lipids Have your blood tested for lipids and cholesterol at 27 years of age, then have this test every 5 years. Have your cholesterol levels checked more often if:  Your lipid or cholesterol levels are high.  You are older than 27 years of age.  You are at high risk for heart disease. What should I know about cancer screening? Depending on your health history and family history, you may need to have cancer screening at various ages. This may include screening for:  Breast cancer.  Cervical cancer.  Colorectal cancer.  Skin cancer.  Lung cancer. What should I know about heart disease, diabetes, and high blood  pressure? Blood pressure and heart disease  High blood pressure causes heart disease and increases the risk of stroke. This is more likely to develop in people who have high blood pressure readings, are of African descent, or are overweight.  Have your blood pressure checked: ? Every 3-5 years if you are 18-39 years of age. ? Every year if you are 40 years old or older. Diabetes Have regular diabetes screenings. This checks your fasting blood sugar level. Have the screening done:  Once every three years after age 40 if you are at a normal weight and have a low risk for diabetes.  More often and at a younger age if you are overweight or have a high risk for diabetes. What should I know about preventing infection? Hepatitis B If you have a higher risk for hepatitis B, you should be screened for this virus. Talk with your health care provider to find out if you are at risk for hepatitis B infection. Hepatitis C Testing is recommended for:  Everyone born from 1945 through 1965.  Anyone with known risk factors for hepatitis C. Sexually transmitted infections (STIs)  Get screened for STIs, including gonorrhea and chlamydia, if: ? You are sexually active and are younger than 27 years of age. ? You are older than 27 years of age and your health care provider tells you that you are at risk for this type of infection. ? Your sexual activity has changed since you were last screened, and you are at increased risk for chlamydia or gonorrhea. Ask your health care provider if   you are at risk.  Ask your health care provider about whether you are at high risk for HIV. Your health care provider may recommend a prescription medicine to help prevent HIV infection. If you choose to take medicine to prevent HIV, you should first get tested for HIV. You should then be tested every 3 months for as long as you are taking the medicine. Pregnancy  If you are about to stop having your period (premenopausal) and  you may become pregnant, seek counseling before you get pregnant.  Take 400 to 800 micrograms (mcg) of folic acid every day if you become pregnant.  Ask for birth control (contraception) if you want to prevent pregnancy. Osteoporosis and menopause Osteoporosis is a disease in which the bones lose minerals and strength with aging. This can result in bone fractures. If you are 65 years old or older, or if you are at risk for osteoporosis and fractures, ask your health care provider if you should:  Be screened for bone loss.  Take a calcium or vitamin D supplement to lower your risk of fractures.  Be given hormone replacement therapy (HRT) to treat symptoms of menopause. Follow these instructions at home: Lifestyle  Do not use any products that contain nicotine or tobacco, such as cigarettes, e-cigarettes, and chewing tobacco. If you need help quitting, ask your health care provider.  Do not use street drugs.  Do not share needles.  Ask your health care provider for help if you need support or information about quitting drugs. Alcohol use  Do not drink alcohol if: ? Your health care provider tells you not to drink. ? You are pregnant, may be pregnant, or are planning to become pregnant.  If you drink alcohol: ? Limit how much you use to 0-1 drink a day. ? Limit intake if you are breastfeeding.  Be aware of how much alcohol is in your drink. In the [place] one drink equals one 12 oz bottle of beer (355 mL), one 5 oz glass of wine (148 mL), or one 1 oz glass of hard liquor (44 mL). General instructions  Schedule regular health, dental, and eye exams.  Stay current with your vaccines.  Tell your health care provider if: ? You often feel depressed. ? You have ever been abused or do not feel safe at home. Summary  Adopting a healthy lifestyle and getting preventive care are important in promoting health and wellness.  Follow your health care provider's instructions about healthy  diet, exercising, and getting tested or screened for diseases.  Follow your health care provider's instructions on monitoring your cholesterol and blood pressure. This information is not intended to replace advice given to you by your health care provider. Make sure you discuss any questions you have with your health care provider. Document Revised: 11/04/2018 Document Reviewed: 11/04/2018 Elsevier Patient Education  2020 Elsevier Inc.  

## 2020-11-08 NOTE — Progress Notes (Signed)
Melanie Jordan is a 29 y.o. female  Chief Complaint  Patient presents with  . Annual Exam    CPE/labs.  Fasting today.       HPI: Melanie Jordan is a 29 y.o. female here for annual CPE, fasting labs.  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   Last PAP: 08/2018 - due in 08/2021  Diet/Exercise: diet is "in between" and admits to liking fast food; vestibular migraines worse with exercise (jogging), has not tried walking Dental: due for appt Vision: UTD, wears glasses   Med refills needed today? She would like to switch OCP. She is not getting regular periods. And she would like to try to take something to help more with her migraines.    Past Medical History:  Diagnosis Date  . ADHD    now off Adderal   . Allergy   . Anxiety   . Chronic head pain    taking Metoprolol  . GERD (gastroesophageal reflux disease)   . IBS (irritable bowel syndrome)   . Migraine     Past Surgical History:  Procedure Laterality Date  . ESOPHAGEAL MANOMETRY N/A 10/13/2019   Procedure: ESOPHAGEAL MANOMETRY (EM);  Surgeon: Mauri Pole, MD;  Location: [facility];  Service: Endoscopy;  Laterality: N/A;    Social History   Socioeconomic History  . Marital status: Single    Spouse name: Not on file  . Number of children: 0  . Years of education: Not on file  . Highest education level: Associate degree: occupational, Hotel manager, or vocational program  Occupational History  . 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 Smoking status: Never Smoker  . Smokeless tobacco: Never Used  Vaping Use  . Vaping Use: Never used  Substance and Sexual Activity  . Alcohol use: Yes    Comment: social  . Drug use: Never  . Sexual activity: Yes    Birth control/protection: Pill  Other Topics Concern  . Not on file  Social History Narrative   Engaged.      Started drinking more during the day - up to ~2 L (since onset of dizziness).       NO routine exercise.      Works in [employer] @ [employer] Strain: Not on file  Food Insecurity: Not on file  Transportation Needs: Not on file  Physical Activity: Not on file  Stress: Not on file  Social Connections: Not on file  Intimate Partner Violence: Not on file    Family History  Problem Relation Age of Onset  . Cancer Mother 30       cervical and stomach cancer  . Fibroids Mother        uterine fibroid  . Thyroid disease Mother        hypothyroidism due to Miner  . Osteopenia Mother   . GER disease Mother   . Drug abuse Mother   . Stomach cancer Mother   . Anxiety disorder Mother   . Asthma Brother   . Anxiety disorder Brother   . Irritable bowel syndrome Brother   . Arthritis Maternal Grandmother   . Depression Maternal Grandmother   . Heart disease Maternal Grandmother 63       CAD with stents  . Hyperlipidemia Maternal Grandmother   . Hypertension Maternal Grandmother   . Stroke Maternal  Grandmother 72       TIA  . Cancer Paternal Grandmother 66       uterine cancer  . Asthma Brother   . Anxiety disorder Sister   . Depression Father   . ADD / ADHD Maternal Uncle   . Colon cancer Neg Hx   . Esophageal cancer Neg Hx   . Rectal cancer Neg Hx      Immunization History  Administered Date(s) Administered  . Hpv 05/26/2015  . Influenza,inj,Quad PF,6+ Mos 08/06/2018  . Influenza-Unspecified 08/15/2019  . PFIZER SARS-COV-2 Vaccination 10/27/2019, 12/07/2019, 08/01/2020  . Tdap 05/26/2015    Outpatient Encounter Medications as of 11/08/2020  Medication Sig  . Acetaminophen (TYLENOL PO) Take by mouth as needed.  . ALPRAZolam (XANAX) 0.25 MG tablet TAKE 1 TABLET BY MOUTH DAILY AS NEEDED FOR ANXIETY  . Cetirizine HCl (ZYRTEC PO) Take by mouth daily as needed.   . Cholecalciferol (VITAMIN D3) 25 MCG (1000 UT) CAPS Take 1 capsule by mouth daily.  . cyclobenzaprine (FLEXERIL) 5 MG tablet Take 5 mg by  mouth 3 (three) times daily.  . Galcanezumab-gnlm (EMGALITY) 120 MG/ML SOAJ   . ibuprofen (ADVIL) 200 MG tablet Take 200 mg by mouth every 6 (six) hours as needed.  Lenda Kelp FE 1/20 1-20 MG-MCG tablet TAKE 1 TABLET BY MOUTH EVERY DAY  . Multiple Vitamins-Minerals (MULTIVITAMIN ADULTS PO) Take by mouth daily.  . promethazine (PHENERGAN) 25 MG tablet TAKE 1 TABLET (25 MG TOTAL) BY MOUTH EVERY 8 (EIGHT) HOURS AS NEEDED FOR NAUSEA FOR UP TO 90 DAYS  . Rimegepant Sulfate (NURTEC) 75 MG TBDP   . vitamin B-12 (CYANOCOBALAMIN) 1000 MCG tablet Take 1,000 mcg by mouth daily.  . Rimegepant Sulfate 75 MG TBDP Take by mouth.   No facility-administered encounter medications on file as of 11/08/2020.     ROS: Gen: no fever, chills  Skin: no rash, itching ENT: no ear pain, ear drainage, nasal congestion, rhinorrhea, sinus pressure, sore throat Eyes: no blurry vision, double vision Resp: no cough, wheeze,SOB Breast: no breast tenderness, no nipple discharge, no breast masses CV: no CP, palpitations, LE edema,  GI: no heartburn, n/v/d/c, abd pain GU: no dysuria, urgency, frequency, hematuria MSK: no joint pain, myalgias, back pain Neuro: vestibular migraines Psych: no depression, anxiety, insomnia   No Known Allergies  BP 118/70   Pulse 76   Temp 98.2 F (36.8 C) (Temporal)   Ht 5\' 7"  (1.702 m)   Wt 167 lb 9.6 oz (76 kg)   SpO2 98%   BMI 26.25 kg/m    BP Readings from Last 3 Encounters:  11/08/20 118/70  11/07/20 110/74  03/28/20 120/78   Pulse Readings from Last 3 Encounters:  11/08/20 76  11/07/20 77  03/22/20 (!) 102   Wt Readings from Last 3 Encounters:  11/08/20 167 lb 9.6 oz (76 kg)  11/07/20 165 lb (74.8 kg)  03/28/20 164 lb (74.4 kg)     Physical Exam Constitutional:      General: She is not in acute distress.    Appearance: She is well-developed and well-nourished.  HENT:     Head: Normocephalic and atraumatic.     Right Ear: Tympanic membrane and ear canal  normal.     Left Ear: Tympanic membrane and ear canal normal.     Nose: Nose normal.     Mouth/Throat:     Mouth: Oropharynx is clear and moist and mucous membranes are normal.  Eyes:     Conjunctiva/sclera:  Conjunctivae normal.     Pupils: Pupils are equal, round, and reactive to light.  Neck:     Thyroid: No thyromegaly.  Cardiovascular:     Rate and Rhythm: Normal rate and regular rhythm.     Pulses: Intact distal pulses.     Heart sounds: Normal heart sounds. No murmur heard.   Pulmonary:     Effort: Pulmonary effort is normal. No respiratory distress.     Breath sounds: Normal breath sounds. No wheezing or rhonchi.  Abdominal:     General: Bowel sounds are normal. There is no distension.     Palpations: Abdomen is soft. There is no mass.     Tenderness: There is no abdominal tenderness.  Musculoskeletal:        General: No edema.     Cervical back: Neck supple.     Right lower leg: No edema.     Left lower leg: No edema.  Lymphadenopathy:     Cervical: No cervical adenopathy.  Skin:    General: Skin is warm and dry.  Neurological:     Mental Status: She is alert and oriented to person, place, and time.     Motor: No abnormal muscle tone.     Coordination: Coordination normal.  Psychiatric:        Mood and Affect: Mood and affect normal.        Behavior: Behavior normal.      A/P:  1. Annual physical exam - discussed importance of regular CV exercise, healthy diet, adequate sleep - UTD on PAP - UTD on immunizations - due for dentist, UTD on vision exam - ALT - Basic metabolic panel - AST - CBC - next CPE in 1 year  2. Elevated TSH - TSH - T4, free  3. B12 deficiency - Vitamin B12  4. Screening for lipid disorders - Lipid panel  5. Encounter for birth control pills maintenance - d/c junel Rx: - norethindrone (MICRONOR) 0.35 MG tablet; Take 1 tablet (0.35 mg total) by mouth daily.  Dispense: 84 tablet; Refill: 3   This visit occurred during the  SARS-CoV-2 public health emergency.  Safety protocols were in place, including screening questions prior to the visit, additional usage of staff PPE, and extensive cleaning of exam room while observing appropriate contact time as indicated for disinfecting solutions.

## 2020-11-09 ENCOUNTER — Encounter: Payer: Self-pay | Admitting: Family Medicine

## 2020-11-21 ENCOUNTER — Ambulatory Visit: Payer: 59 | Admitting: Psychiatry

## 2020-11-21 ENCOUNTER — Encounter: Payer: Self-pay | Admitting: Cardiology

## 2020-11-21 NOTE — Assessment & Plan Note (Signed)
Only short-lived symptoms do seem to probably be related to anxiety bradycardia probably a dehydration.  Monitor did not show any significant findings despite her having significant symptoms.  Plan: For now we will simply monitor. She did not get her as needed propranolol filled. If she has worsening symptoms, we will potentially consider refilling either propranolol versus diltiazem.  Since she is quite stable now, we will follow up in 2 years to reassess.

## 2020-11-21 NOTE — Assessment & Plan Note (Signed)
Probably musculoskeletal in nature. She had chest discomfort also that sounds like it was associated with anxiety.  Nonischemic treadmill stress test in 29 year old. Would not do further testing.

## 2020-11-21 NOTE — Assessment & Plan Note (Signed)
Does not truly meet criteria for POTS -> recommendation was increase hydration with salt liberalization. If symptoms worsen, consider support stockings.

## 2020-11-26 ENCOUNTER — Other Ambulatory Visit: Payer: Self-pay | Admitting: Family Medicine

## 2020-11-26 DIAGNOSIS — Z30011 Encounter for initial prescription of contraceptive pills: Secondary | ICD-10-CM

## 2020-11-29 ENCOUNTER — Ambulatory Visit: Payer: 59 | Admitting: Psychiatry

## 2020-11-29 NOTE — Telephone Encounter (Signed)
Patient currently taking norethindrone (MICRONOR) 0.35 MG tablet

## 2020-12-04 ENCOUNTER — Encounter: Payer: Self-pay | Admitting: Family Medicine

## 2020-12-04 DIAGNOSIS — F4322 Adjustment disorder with anxiety: Secondary | ICD-10-CM

## 2020-12-04 DIAGNOSIS — Z01419 Encounter for gynecological examination (general) (routine) without abnormal findings: Secondary | ICD-10-CM

## 2020-12-12 ENCOUNTER — Ambulatory Visit: Payer: 59 | Admitting: Psychiatry

## 2021-01-10 ENCOUNTER — Encounter: Payer: Self-pay | Admitting: Psychiatry

## 2021-01-10 ENCOUNTER — Ambulatory Visit (INDEPENDENT_AMBULATORY_CARE_PROVIDER_SITE_OTHER): Payer: No Typology Code available for payment source | Admitting: Psychiatry

## 2021-01-10 ENCOUNTER — Other Ambulatory Visit: Payer: Self-pay

## 2021-01-10 DIAGNOSIS — F411 Generalized anxiety disorder: Secondary | ICD-10-CM

## 2021-01-10 DIAGNOSIS — F064 Anxiety disorder due to known physiological condition: Secondary | ICD-10-CM

## 2021-01-10 DIAGNOSIS — F410 Panic disorder [episodic paroxysmal anxiety] without agoraphobia: Secondary | ICD-10-CM

## 2021-01-10 MED ORDER — ALPRAZOLAM 0.5 MG PO TABS: ORAL_TABLET | ORAL | 2 refills | Status: DC

## 2021-01-10 NOTE — Progress Notes (Signed)
Melanie Jordan [id number] April 29, 1991 30 y.o.  Subjective:   Patient ID:  Melanie Jordan is a 30 y.o. (DOB August 30, 1991) female.  Chief Complaint:  Chief Complaint  Patient presents with  . Anxiety  . Panic Attack    HPI Melanie Jordan presents to the office today for follow-up of anxiety.She reports that she has had one full blown panic attack. She reports that she has had some panic s/s and tries to ward it off with deep breathing or taking Xanax prn. Reports that Xanax prn does not seem to be as effective as it once was and has not taken it recently due to it not being effective. Reports that she typically takes it about 3-4 times a month. Panic s/s tend to happen when she is trying to go to sleep. She reports that she has some generalized anxiety.   Has a new job with [employer]. 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 Has been working there prn since September. She has had some anxiety with transitioning to full-time and reports that she is concerned about how others are perceiving her work. Catastrophic thinking and rumination. Reports social anxiety. Occasionally feels as if others may be talking about her.   Has some depression when health issues. She reports that mood worsens if she has ETOH. She reports some chronic, low-level depression. Sleep has been ok. Denies nightmares. Appetite has been good. She reports that she eats when she is bored and is concerned about wt gain. Motivation is low for most things, except for what she is interested in. Concentration has been poor and reports that she is easily distracted. Reports that she was prescribed Strattera and has not yet taken this. Denies SI.   Interested in reading.   Past Psychiatric Medication Trials: Nortriptyline- Caused depression and SI. Effexor- "Felt not connected to body." Felt fearful. Prozac- Felt jittery but less anxious. HR decreased in combination with Metoprolol Viibryd- worsening  derealization Buspar- "felt like I was drunk." Wellbutrin- had frequent tearfulness Adderall- Had increased HR. Hyper-focus on IR. Caused dizziness. Had severe migraines when it wears off  Adderall XR Mydayis- Best tolerated Adzenys- Anger, nausea, wt loss Contempla- tolerated at low dose but want not effective. Had dizziness with higher doses. Xanax- Effective for panic attacks Metoprolol- was prescribed by neurologist for migraines and was advised Propranolol- Had dizziness and lightheadedness. Has taken prn migraines.   GAD-7   Flowsheet Row Office Visit from 04/02/2019 in [facility]  Total GAD-7 Score 8    PHQ2-9   [facility] Visit from 11/08/2020 in [facility] Visit from 04/02/2019 in [facility] Visit from 09/21/2018 in [facility]  PHQ-2 Total Score 2 0 0  PHQ-9 Total Score 5 4 --       Review of Systems:  Review of Systems  Cardiovascular: Positive for palpitations.       Reports that palpitations seem to occur in the context of anxiety  Musculoskeletal: Negative for gait problem.  Neurological: Positive for dizziness and headaches.  Psychiatric/Behavioral:       Please refer to HPI    Medications: I have reviewed the patient's current medications.  Current Outpatient Medications  Medication Sig Dispense Refill  . Acetaminophen (TYLENOL PO) Take by mouth as needed.    . ALPRAZolam (XANAX) 0.5 MG tablet Take 1/2-1 tablet po qd prn anxiety 30 tablet 2  . Cetirizine HCl (ZYRTEC PO) Take by  mouth daily as needed.     . Cholecalciferol (VITAMIN D3) 25 MCG (1000 UT) CAPS Take 1 capsule by mouth daily.    . Galcanezumab-gnlm (EMGALITY) 120 MG/ML SOAJ     . ibuprofen (ADVIL) 200 MG tablet Take 200 mg by mouth every 6 (six) hours as needed.    . Multiple Vitamins-Minerals (MULTIVITAMIN ADULTS PO) Take by mouth daily.    . norethindrone (MICRONOR) 0.35 MG tablet Take  1 tablet (0.35 mg total) by mouth daily. 84 tablet 3  . Rimegepant Sulfate (NURTEC) 75 MG TBDP     . vitamin B-12 (CYANOCOBALAMIN) 1000 MCG tablet Take 1,000 mcg by mouth daily.    . cyclobenzaprine (FLEXERIL) 5 MG tablet Take 5 mg by mouth 3 (three) times daily. (Patient not taking: Reported on 01/10/2021)    . promethazine (PHENERGAN) 25 MG tablet TAKE 1 TABLET (25 MG TOTAL) BY MOUTH EVERY 8 (EIGHT) HOURS AS NEEDED FOR NAUSEA FOR UP TO 90 DAYS (Patient not taking: Reported on 01/10/2021)    . Rimegepant Sulfate 75 MG TBDP Take by mouth.     No current facility-administered medications for this visit.    Medication Side Effects: None  Allergies: No Known Allergies  Past Medical History:  Diagnosis Date  . ADHD    now off Adderal   . Allergy   . Anxiety   . Chronic head pain    taking Metoprolol  . GERD (gastroesophageal reflux disease)   . IBS (irritable bowel syndrome)   . Migraine     Family History  Problem Relation Age of Onset  . Cancer Mother 30       cervical and stomach cancer  . Fibroids Mother        uterine fibroid  . Thyroid disease Mother        hypothyroidism due to Bow[place]  . Osteopenia Mother   . GER disease Mother   . Drug abuse Mother   . Stomach cancer Mother   . Anxiety disorder Mother   . Asthma Brother   . Anxiety disorder Brother   . Irritable bowel syndrome Brother   . Arthritis Maternal Grandmother   . Depression Maternal Grandmother   . Heart disease Maternal Grandmother 63       CAD with stents  . Hyperlipidemia Maternal Grandmother   . Hypertension Maternal Grandmother   . Stroke Maternal Grandmother 72       TIA  . Cancer Paternal Grandmother 77       uterine cancer  . Asthma Brother   . Anxiety disorder Sister   . Depression Father   . ADD / ADHD Maternal Uncle   . Colon cancer Neg Hx   . Esophageal cancer Neg Hx   . Rectal cancer Neg Hx     Social History   Socioeconomic History  . Marital status: Single    Spouse name:  Not on file  . Number of children: 0  . Years of education: Not on file  . Highest education level: Associate degree: occupational, Hotel manager, or vocational program  Occupational History  . 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 Smoking status: Never Smoker  . Smokeless tobacco: Never Used  Vaping Use  . Vaping Use: Never used  Substance and Sexual Activity  . Alcohol use: Yes    Comment: social  . Drug use: Never  . Sexual activity: Yes    Birth control/protection: Pill  Other  Topics Concern  . Not on file  Social History Narrative   Engaged.      Started drinking more during the day - up to ~2 L (since onset of dizziness).      NO routine exercise.      Works in [employer] @ [employer] Strain: Not on file  Food Insecurity: Not on file  Transportation Needs: Not on file  Physical Activity: Not on file  Stress: Not on file  Social Connections: Not on file  Intimate Partner Violence: Not on file    Past Medical History, Surgical history, Social history, and Family history were reviewed and updated as appropriate.   Please see review of systems for further details on the patient's review from today.   Objective:   Physical Exam:  There were no vitals taken for this visit.  Physical Exam Constitutional:      General: She is not in acute distress. Musculoskeletal:        General: No deformity.  Neurological:     Mental Status: She is alert and oriented to person, place, and time.     Coordination: Coordination normal.  Psychiatric:        Attention and Perception: Attention and perception normal. She does not perceive auditory or visual hallucinations.        Mood and Affect: Mood is anxious. Mood is not depressed. Affect is not labile, blunt, angry or inappropriate.        Speech: Speech normal.        Behavior: Behavior normal.        Thought Content: Thought  content normal. Thought content is not paranoid or delusional. Thought content does not include homicidal or suicidal ideation. Thought content does not include homicidal or suicidal plan.        Cognition and Memory: Cognition and memory normal.        Judgment: Judgment normal.     Comments: Insight intact     Lab Review:     Component Value Date/Time   NA 140 11/08/2020 1010   K 4.3 11/08/2020 1010   CL 108 11/08/2020 1010   CO2 25 11/08/2020 1010   GLUCOSE 87 11/08/2020 1010   BUN 8 11/08/2020 1010   CREATININE 0.63 11/08/2020 1010   CREATININE 0.73 04/12/2019 1450   CALCIUM 9.0 11/08/2020 1010   PROT 7.6 08/03/2019 1130   ALBUMIN 4.5 08/03/2019 1130   AST 15 11/08/2020 1010   ALT 14 11/08/2020 1010   ALKPHOS 36 (L) 08/03/2019 1130   BILITOT 0.6 08/03/2019 1130   GFRNONAA >60 08/03/2019 1130   GFRAA >60 08/03/2019 1130       Component Value Date/Time   WBC 3.9 (L) 11/08/2020 1010   RBC 4.46 11/08/2020 1010   HGB 13.6 11/08/2020 1010   HCT 40.8 11/08/2020 1010   PLT 258.0 11/08/2020 1010   MCV 91.4 11/08/2020 1010   MCH 30.5 08/03/2019 1130   MCHC 33.3 11/08/2020 1010   RDW 13.0 11/08/2020 1010   LYMPHSABS 2.6 08/03/2019 1130   MONOABS 0.4 08/03/2019 1130   EOSABS 0.1 08/03/2019 1130   BASOSABS 0.0 08/03/2019 1130    No results found for: POCLITH, LITHIUM   No results found for: PHENYTOIN, PHENOBARB, VALPROATE, CBMZ   .res Assessment: Plan:   Patient reports that she would prefer to continue alprazolam as needed for anxiety instead of starting a new medication due to history of adverse effects with  multiple medications and limited improvement.  Discussed increasing dose of Xanax since 0.25 mg dose has not been fully effective for panic signs and symptoms. Will increase Xanax to 0.5 mg 1/2-1 tablet daily as needed for anxiety. Patient reports that her new insurance may no longer cover her care in this office and she may transfer care to a psychiatric provider  in network.  Patient plans to follow-up on an as-needed basis. Patient advised to contact office with any questions, adverse effects, or acute worsening in signs and symptoms.    Melanie Jordan was seen today for anxiety and panic attack.  Diagnoses and all orders for this visit:  Generalized anxiety disorder -     ALPRAZolam (XANAX) 0.5 MG tablet; Take 1/2-1 tablet po qd prn anxiety  Anxiety disorder due to general medical condition with panic attack     Please see After Visit Summary for patient specific instructions.  No future appointments.  No orders of the defined types were placed in this encounter.   -------------------------------

## 2021-04-06 ENCOUNTER — Encounter: Payer: Self-pay | Admitting: Family Medicine

## 2021-04-26 DIAGNOSIS — N926 Irregular menstruation, unspecified: Secondary | ICD-10-CM | POA: Insufficient documentation

## 2021-06-15 DIAGNOSIS — K649 Unspecified hemorrhoids: Secondary | ICD-10-CM | POA: Insufficient documentation

## 2021-06-15 DIAGNOSIS — F32A Depression, unspecified: Secondary | ICD-10-CM | POA: Insufficient documentation

## 2021-10-30 LAB — RESULTS CONSOLE HPV: CHL HPV: NEGATIVE

## 2021-10-30 LAB — HM PAP SMEAR

## 2022-03-08 ENCOUNTER — Other Ambulatory Visit: Payer: Self-pay

## 2022-03-08 MED ORDER — NORETHINDRONE 0.35 MG PO TABS
ORAL_TABLET | ORAL | 3 refills | Status: DC
Start: 2021-11-26 — End: 2023-05-28
  Filled 2022-03-08: qty 84, 84d supply, fill #0
  Filled 2022-05-31: qty 84, 84d supply, fill #1

## 2022-03-13 ENCOUNTER — Ambulatory Visit: Payer: No Typology Code available for payment source | Admitting: Physician Assistant

## 2022-03-20 ENCOUNTER — Other Ambulatory Visit: Payer: Self-pay

## 2022-03-20 ENCOUNTER — Telehealth: Payer: No Typology Code available for payment source | Admitting: Physician Assistant

## 2022-03-20 DIAGNOSIS — J020 Streptococcal pharyngitis: Secondary | ICD-10-CM | POA: Diagnosis not present

## 2022-03-20 MED ORDER — AMOXICILLIN 500 MG PO CAPS
500.0000 mg | ORAL_CAPSULE | Freq: Two times a day (BID) | ORAL | 0 refills | Status: AC
  Filled 2022-03-20: qty 20, 10d supply, fill #0

## 2022-03-20 NOTE — Progress Notes (Signed)

## 2022-04-08 ENCOUNTER — Encounter: Payer: Self-pay | Admitting: Physician Assistant

## 2022-04-08 ENCOUNTER — Ambulatory Visit (INDEPENDENT_AMBULATORY_CARE_PROVIDER_SITE_OTHER): Payer: No Typology Code available for payment source | Admitting: Physician Assistant

## 2022-04-08 VITALS — BP 110/70 | HR 70 | Temp 98.2°F | Ht 67.0 in | Wt 183.0 lb

## 2022-04-08 DIAGNOSIS — F411 Generalized anxiety disorder: Secondary | ICD-10-CM | POA: Diagnosis not present

## 2022-04-08 DIAGNOSIS — K589 Irritable bowel syndrome without diarrhea: Secondary | ICD-10-CM

## 2022-04-08 DIAGNOSIS — E876 Hypokalemia: Secondary | ICD-10-CM | POA: Insufficient documentation

## 2022-04-08 DIAGNOSIS — M25562 Pain in left knee: Secondary | ICD-10-CM

## 2022-04-08 DIAGNOSIS — F909 Attention-deficit hyperactivity disorder, unspecified type: Secondary | ICD-10-CM

## 2022-04-08 DIAGNOSIS — R159 Full incontinence of feces: Secondary | ICD-10-CM | POA: Diagnosis not present

## 2022-04-08 DIAGNOSIS — E538 Deficiency of other specified B group vitamins: Secondary | ICD-10-CM | POA: Insufficient documentation

## 2022-04-08 DIAGNOSIS — G43809 Other migraine, not intractable, without status migrainosus: Secondary | ICD-10-CM | POA: Diagnosis not present

## 2022-04-08 NOTE — Patient Instructions (Signed)
It was great to see you! ? ?We will place referral to Dr. Bryan Lemma at [facility] and to our Sports Medicine group. ? ?Try to use the flonase more consistently to see if that helps your throat discomfort. ? ?A referral has been placed for you to see one of our fantastic providers at [facility]. ?Someone from their office will be in touch soon regarding scheduling your appointment. ? ?Their location:  ?[facility]  ?[address] on the 1st floor ?Phone number [phone] ?Fax [phone].  ? ?This location is across the street from the entrance to [employer] and in the same complex as the [facility] ? ?Let's follow-up for a physical whenever you are ready, sooner if you have concerns. ? ?If a referral was placed today, you will be contacted for an appointment. Please note that routine referrals can sometimes take up to 3-4 weeks to process. Please call our office if you haven't heard anything after this time frame. ? ?Take care, ? ?Inda Coke PA-C  ?

## 2022-04-08 NOTE — Progress Notes (Signed)
Melanie Jordan is a 31 y.o. female here for a follow up of a pre-existing problem. ? ?History of Present Illness:  ? ?Chief Complaint  ?Patient presents with  ? Establish Care  ? Encopresis  ?  Pt is c/o leaking stool after having bowel movements, has to wipe a lot x's 6 months and also has hemorrhoids. She would like a referral to GI.  ? Knee Pain  ?  Pt c/o left knee pain when working out and popping of knee x 1 week.  ? ? ?HPI ? ? ?Fecal incontinence/IBS ?She have a longstanding of diarrhea for many years. She notes her symptoms started when she was a kid. She was recommended Levsin at that time. Her symptoms were well controlled. However, her symptoms have came back since 2020. She has been having fecal incontinence most of the time. She notes she is having some leaking stool after BM. Sometimes she is unable to make it to the bathroom. She does have a hx of hemorrhoids. She was last seen by GI on 08/13/21 at [facility] by Lennox Solders PA-C. She was recommended to have a colonoscopy but did not have this as her insurance has changed. She saw Dr. Gerrit Heck in 2020 and would like to resume care with him. ? ? ?Knee pain ?Patient complain of left knee pain. This has been onset for the past 1 week. She notes pain usually start after working out. Has noticed some popping and clicking sounds. 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 She has not tried any treatment for this issue. Denies any swelling or stiffness. No reported trauma or injury.  ? ?She mentions that last year she felt something pop in this knee and then she had a visible deformity. She did not have any work-up at that time. ? ? ?Anxiety; ADHD ?Patient has been having increased anxiety. This is chronic issue for the past couple of years. She states she has seen at [facility] for this issue. States she has been feeling anxious. Currently not taking any medication. She was prescribed  Xanax 0.5 mg  in the past which has helped. States symptoms are manageable at this time. She has not tried any other treatment at this time. No SI/HI ? ?She reports ongoing ADHD symptoms. She states that she could not tolerate stimulants because it caused worsening migraines. ? ? ?Vestibular Migraines ?Patient has had issue with migraines which has been going for a while. States symptoms started about 3 years ago. She thinks her anxiety is related to her symptoms. Symptoms seems to be manageable at this time. She sees her Neurologist Dr Mila Merry for this issue. She is currently taking Hydroxyzine 25 mg daily which seems to be helping with migraines. States correlates with her migraines. Denies any worsening symptoms at this time.  ? ? ? ? ?Past Medical History:  ?Diagnosis Date  ? ADHD   ? now off Adderal   ? Allergy   ? Anxiety   ? Chronic head pain   ? taking Metoprolol  ? Depression   ? GERD (gastroesophageal reflux disease)   ? H/O endoscopy 2020  ? IBS (irritable bowel syndrome)   ? Migraine   ? ?  ?Social History  ? ?Tobacco Use  ? Smoking status: Never  ? Smokeless tobacco: Never  ?Vaping Use  ? Vaping Use: Never used  ?Substance Use Topics  ? Alcohol use: Yes  ?  Alcohol/week: 7.0 standard drinks  ?  Types: 7 Cans of beer per week  ?  Comment: Socially  ? Drug use: Never  ? ? ?Past Surgical History:  ?Procedure Laterality Date  ? ESOPHAGEAL MANOMETRY N/A 10/13/2019  ? Procedure: ESOPHAGEAL MANOMETRY (EM);  Surgeon: Mauri Pole, MD;  Location: [facility];  Service: Endoscopy;  Laterality: N/A;  ? TRANSTHORACIC ECHOCARDIOGRAM  03/2020  ? EF 60 to 65%..  Normal valves.  Normal chamber sizes.  No pericardial effusion..  ? ? ?Family History  ?Problem Relation Age of Onset  ? Cancer Mother   ?     cervical and stomach cancer  ? Fibroids Mother   ?     uterine fibroid  ? Thyroid disease Mother   ?     hypothyroidism due to Carlos  ? Osteopenia Mother   ? GER disease Mother   ? Drug abuse Mother   ?  Stomach cancer Mother   ? Anxiety disorder Mother   ? Arthritis Mother   ? Asthma Brother   ? Anxiety disorder Brother   ? Irritable bowel syndrome Brother   ? Arthritis Maternal Grandmother   ? Depression Maternal Grandmother   ? Heart disease Maternal Grandmother   ?     CAD with stents  ? Hyperlipidemia Maternal Grandmother   ? Hypertension Maternal Grandmother   ? Stroke Maternal Grandmother   ?     TIA  ? Cancer Paternal Grandmother   ?     uterine cancer  ? Asthma Brother   ? Anxiety disorder Sister   ? Depression Father   ? ADD / ADHD Maternal Uncle   ? Colon cancer Neg Hx   ? Esophageal cancer Neg Hx   ? Rectal cancer Neg Hx   ? ? ?No Known Allergies ? ?Current Medications:  ? ?Current Outpatient Medications:  ?  Acetaminophen (TYLENOL PO), Take by mouth as needed., Disp: , Rfl:  ?  AIMOVIG 140 MG/ML SOAJ, Inject into the skin., Disp: , Rfl:  ?  ALPRAZolam (XANAX) 0.5 MG tablet, Take 1/2-1 tablet po qd prn anxiety, Disp: 30 tablet, Rfl: 2 ?  Cetirizine HCl (ZYRTEC PO), Take by mouth daily as needed. , Disp: , Rfl:  ?  Cholecalciferol (VITAMIN D3) 25 MCG (1000 UT) CAPS, Take 1 capsule by mouth daily., Disp: , Rfl:  ?  cyclobenzaprine (FLEXERIL) 5 MG tablet, Take 5 mg by mouth 3 (three) times daily., Disp: , Rfl:  ?  hydrOXYzine (VISTARIL) 25 MG capsule, Take by mouth., Disp: , Rfl:  ?  ibuprofen (ADVIL) 200 MG tablet, Take 200 mg by mouth every 6 (six) hours as needed., Disp: , Rfl:  ?  Multiple Vitamins-Minerals (MULTIVITAMIN ADULTS PO), Take by mouth daily., Disp: , Rfl:  ?  norethindrone (MICRONOR) 0.35 MG tablet, Take 1 tablet by mouth daily, Disp: 84 tablet, Rfl: 3 ?  ondansetron (ZOFRAN-ODT) 8 MG disintegrating tablet, Take by mouth., Disp: , Rfl:  ?  vitamin B-12 (CYANOCOBALAMIN) 1000 MCG tablet, Take 1,000 mcg by mouth daily., Disp: , Rfl:   ? ?Review of Systems:  ? ?ROS ?Negative unless otherwise specified per HPI.  ? ?Vitals:  ? ?Vitals:  ? 04/08/22 0831  ?BP: 110/70  ?Pulse: 70  ?Temp: 98.2 ?F  (36.8 ?C)  ?TempSrc: Temporal  ?SpO2: 98%  ?Weight: 183 lb (83 kg)  ?Height: '5\' 7"'$  (1.702 m)  ?   ?Body mass index is 28.66 kg/m?. ? ?Physical Exam:  ? ?Physical Exam ?Vitals and  nursing note reviewed.  ?Constitutional:   ?   General: She is not in acute distress. ?   Appearance: She is well-developed. She is not ill-appearing or toxic-appearing.  ?Cardiovascular:  ?   Rate and Rhythm: Normal rate and regular rhythm.  ?   Pulses: Normal pulses.  ?   Heart sounds: Normal heart sounds, S1 normal and S2 normal.  ?Pulmonary:  ?   Effort: Pulmonary effort is normal.  ?   Breath sounds: Normal breath sounds.  ?Musculoskeletal:  ?   Comments: Normal ROM of L knee ?No TTP or obvious swelling  ?Skin: ?   General: Skin is warm and dry.  ?Neurological:  ?   Mental Status: She is alert.  ?   GCS: GCS eye subscore is 4. GCS verbal subscore is 5. GCS motor subscore is 6.  ?Psychiatric:     ?   Speech: Speech normal.     ?   Behavior: Behavior normal. Behavior is cooperative.  ? ? ?Assessment and Plan:  ? ?Attention deficit hyperactivity disorder (ADHD), unspecified ADHD type ?Uncontrolled ?Declines seeing specialist at this time ?Continue to monitor ? ?GAD (generalized anxiety disorder) ?Uncontrolled ?No SI/HI on today's exam ?Declines seeing specialist at this time ?Continue to monitor ? ?Vestibular migraine ?Well controlled currently ?Mgmt per neuro ? ?Incontinence of feces, unspecified fecal incontinence type; Irritable bowel syndrome, unspecified type ?Uncontrolled ?Will refer back to Dr. Gerrit Heck ? ?Acute pain of left knee ?No red flags ?Referral to sports medicine given prior injury and hypermobility syndrome ? ? ?I,Savera Zaman,acting as a scribe for [employer], PA.,have documented all relevant documentation on the behalf of Inda Coke, PA,as directed by  Inda Coke, PA while in the presence of Inda Coke, [place].  ? ?IInda Coke, PA, have reviewed all documentation for this visit. The  documentation on 04/08/22 for the exam, diagnosis, procedures, and orders are all accurate and complete. ? ?Time spent with patient today was 45 minutes which consisted of chart review of prior PCP notes, neuro

## 2022-04-16 ENCOUNTER — Encounter: Payer: Self-pay | Admitting: Family Medicine

## 2022-04-26 ENCOUNTER — Encounter: Payer: Self-pay | Admitting: Physician Assistant

## 2022-04-26 DIAGNOSIS — F411 Generalized anxiety disorder: Secondary | ICD-10-CM

## 2022-04-26 NOTE — Telephone Encounter (Signed)
Spoke to pt told her I will place a referral for psychiatry. Asked pt if she is okay, any SI or HI thoughts? Pt said no, she is okay. Told her there is a [facility] available, do you have there number? Pt said yes. I will place referral for Psychiatry and someone will contact you to schedule an appt. Pt verbalized understanding.

## 2022-04-26 NOTE — Progress Notes (Signed)
Subjective:    CC: L knee pain  I, Melanie Jordan, LAT, ATC, am serving as scribe for Dr. Lynne Jordan.  HPI: Pt is a 31 y/o female c/o L knee pain x 1 month. Pt recalls an incident when she was walking in heels on gravel and rolled her L ankle, but doesn't recall this incident actually hurting her L knee.  She locates her pain to the anterior aspect of the L knee.  L knee swelling: no L knee mechanical symptoms: yes Aggravating factors: stairs, lunges Treatments tried: none  Pt also R shoulder pain ongoing for several month. Pt locates pain to the anterior aspect of the R shoulder. Pt works an [employer] for a [employer] [place].  Pertinent review of Systems: No fevers or chills  Relevant historical information: Hypermobility syndrome.  Patient works as an Geologist, engineering.   Objective:    Vitals:   04/29/22 1058  BP: 130/82  Pulse: 66  SpO2: 97%   General: Well Developed, well nourished, and in no acute distress.   MSK: Right shoulder: Normal-appearing Nontender. Normal motion pain with abduction. Intact strength. Positive Hawkins and Neer's test.  Positive empty can test. Positive crossover arm test. Positive O'Brien's test. Negative Yergason's and speeds test.  Left knee: Slight decreased VMO bulk otherwise normal-appearing Normal motion. Stable ligamentous exam. Intact strength. Negative McMurray's test. Nontender.   Lab and Radiology Results  Diagnostic Limited MSK Ultrasound of: Right shoulder and left knee Right shoulder: Biceps tendon normal. Subscapularis intact normal. Supraspinatus tendon is intact. Moderate subacromial bursitis present. Infraspinatus tendon is intact. AC joint normal-appearing  Left knee: Quad tendon normal. Patellar tendon normal. Medial and lateral joint line normal-appearing Posterior knee no Baker's cyst.  Impression: Right shoulder: Subacromial bursitis. Left knee: Large normal-appearing   X-ray images left shoulder and  right knee obtained today personally and independently interpreted  Right shoulder: Normal-appearing x-ray right shoulder without acute fractures.  No significant degenerative changes.  Left knee: Minimal degenerative changes medial knee.  No acute fractures.  Await formal radiology review  Impression and Recommendations:    Assessment and Plan: 31 y.o. female with right shoulder pain thought to be due to subacromial bursitis.  Plan to treat with physical therapy.  Left knee pain anterior pain thought to be due to patellofemoral pain syndrome.  Again plan for physical therapy and Voltaren gel.  This all occurs in the setting of hypermobility syndrome which could be a factor as well.  Strengthening with PT should be helpful.  Recheck in 6 weeks.Marland Kitchen  PDMP not reviewed this encounter. Orders Placed This Encounter  Procedures   [place] LIMITED JOINT SPACE STRUCTURES LOW LEFT(NO LINKED CHARGES)    Order Specific Question:   Reason for Exam (SYMPTOM  OR DIAGNOSIS REQUIRED)    Answer:   L knee pain    Order Specific Question:   Preferred imaging location?    Answer:   [facility]   DG Knee AP/LAT W/Sun[place] Left    Standing Status:   Future    Number of Occurrences:   1    Standing Expiration Date:   05/26/2022    Order Specific Question:   Reason for Exam (SYMPTOM  OR DIAGNOSIS REQUIRED)    Answer:   L knee pain    Order Specific Question:   Is patient pregnant?    Answer:   No    Order Specific Question:   Preferred imaging location?    Answer:  [facility]   DG Shoulder Right    Standing Status:   Future    Number of Occurrences:   1    Standing Expiration Date:   04/30/2023    Order Specific Question:   Reason for Exam (SYMPTOM  OR DIAGNOSIS REQUIRED)    Answer:   right shoulder pain    Order Specific Question:   Preferred imaging location?    Answer:   [facility]    Order Specific Question:   Is patient pregnant?    Answer:   No    Ambulatory referral to Physical Therapy    Referral Priority:   Routine    Referral Type:   Physical Medicine    Referral Reason:   Specialty Services Required    Requested Specialty:   Physical Therapy    Number of Visits Requested:   1   No orders of the defined types were placed in this encounter.   Discussed warning signs or symptoms. Please see discharge instructions. Patient expresses understanding.   The above documentation has been reviewed and is accurate and complete Melanie Jordan, M.D.

## 2022-04-29 ENCOUNTER — Ambulatory Visit (INDEPENDENT_AMBULATORY_CARE_PROVIDER_SITE_OTHER): Payer: No Typology Code available for payment source

## 2022-04-29 ENCOUNTER — Ambulatory Visit: Payer: No Typology Code available for payment source | Admitting: Family Medicine

## 2022-04-29 ENCOUNTER — Ambulatory Visit: Payer: Self-pay

## 2022-04-29 VITALS — BP 130/82 | HR 66 | Ht 67.0 in | Wt 183.4 lb

## 2022-04-29 DIAGNOSIS — G8929 Other chronic pain: Secondary | ICD-10-CM | POA: Diagnosis not present

## 2022-04-29 DIAGNOSIS — M25562 Pain in left knee: Secondary | ICD-10-CM | POA: Diagnosis not present

## 2022-04-29 DIAGNOSIS — M25511 Pain in right shoulder: Secondary | ICD-10-CM | POA: Diagnosis not present

## 2022-04-29 NOTE — Patient Instructions (Addendum)
Thank you for coming in today.   Please get an Xray today before you leave   I've referred you to Physical Therapy.  Let us know if you don't hear from them in one week.   Check back in 6 weeks 

## 2022-04-30 NOTE — Progress Notes (Signed)
Right shoulder x-ray looks largely normal to radiology

## 2022-04-30 NOTE — Progress Notes (Signed)
Left knee x-ray looks normal to radiology

## 2022-05-06 ENCOUNTER — Ambulatory Visit (INDEPENDENT_AMBULATORY_CARE_PROVIDER_SITE_OTHER): Payer: No Typology Code available for payment source | Admitting: Physician Assistant

## 2022-05-06 ENCOUNTER — Encounter: Payer: Self-pay | Admitting: Physician Assistant

## 2022-05-06 VITALS — BP 110/70 | HR 67 | Temp 98.3°F | Ht 67.0 in | Wt 181.5 lb

## 2022-05-06 DIAGNOSIS — Z114 Encounter for screening for human immunodeficiency virus [HIV]: Secondary | ICD-10-CM

## 2022-05-06 DIAGNOSIS — F4322 Adjustment disorder with anxiety: Secondary | ICD-10-CM

## 2022-05-06 DIAGNOSIS — Z0000 Encounter for general adult medical examination without abnormal findings: Secondary | ICD-10-CM | POA: Diagnosis not present

## 2022-05-06 DIAGNOSIS — E538 Deficiency of other specified B group vitamins: Secondary | ICD-10-CM | POA: Diagnosis not present

## 2022-05-06 DIAGNOSIS — E663 Overweight: Secondary | ICD-10-CM | POA: Diagnosis not present

## 2022-05-06 DIAGNOSIS — E559 Vitamin D deficiency, unspecified: Secondary | ICD-10-CM

## 2022-05-06 DIAGNOSIS — Z1159 Encounter for screening for other viral diseases: Secondary | ICD-10-CM

## 2022-05-06 LAB — VITAMIN D 25 HYDROXY (VIT D DEFICIENCY, FRACTURES): VITD: 47.95 ng/mL (ref 30.00–100.00)

## 2022-05-06 LAB — CBC WITH DIFFERENTIAL/PLATELET
Basophils Absolute: 0 10*3/uL (ref 0.0–0.1)
Basophils Relative: 0.4 % (ref 0.0–3.0)
Eosinophils Absolute: 0.1 10*3/uL (ref 0.0–0.7)
Eosinophils Relative: 0.8 % (ref 0.0–5.0)
HCT: 39.5 % (ref 36.0–46.0)
Hemoglobin: 13.6 g/dL (ref 12.0–15.0)
Lymphocytes Relative: 34 % (ref 12.0–46.0)
Lymphs Abs: 2.2 10*3/uL (ref 0.7–4.0)
MCHC: 34.3 g/dL (ref 30.0–36.0)
MCV: 92.5 fl (ref 78.0–100.0)
Monocytes Absolute: 0.4 10*3/uL (ref 0.1–1.0)
Monocytes Relative: 6.7 % (ref 3.0–12.0)
Neutro Abs: 3.8 10*3/uL (ref 1.4–7.7)
Neutrophils Relative %: 58.1 % (ref 43.0–77.0)
Platelets: 250 10*3/uL (ref 150.0–400.0)
RBC: 4.27 Mil/uL (ref 3.87–5.11)
RDW: 13 % (ref 11.5–15.5)
WBC: 6.6 10*3/uL (ref 4.0–10.5)

## 2022-05-06 LAB — COMPREHENSIVE METABOLIC PANEL
ALT: 15 U/L (ref 0–35)
AST: 17 U/L (ref 0–37)
Albumin: 4.5 g/dL (ref 3.5–5.2)
Alkaline Phosphatase: 63 U/L (ref 39–117)
BUN: 8 mg/dL (ref 6–23)
CO2: 25 mEq/L (ref 19–32)
Calcium: 9.5 mg/dL (ref 8.4–10.5)
Chloride: 105 mEq/L (ref 96–112)
Creatinine, Ser: 0.69 mg/dL (ref 0.40–1.20)
GFR: 117.76 mL/min (ref >60.00)
Glucose, Bld: 89 mg/dL (ref 70–99)
Potassium: 4 mEq/L (ref 3.5–5.1)
Sodium: 140 mEq/L (ref 135–145)
Total Bilirubin: 0.6 mg/dL (ref 0.2–1.2)
Total Protein: 7.3 g/dL (ref 6.0–8.3)

## 2022-05-06 LAB — VITAMIN B12: Vitamin B-12: 589 pg/mL (ref 211–911)

## 2022-05-06 LAB — LIPID PANEL
Cholesterol: 143 mg/dL (ref 0–200)
HDL: 53.6 mg/dL (ref >39.00)
LDL Cholesterol: 79 mg/dL (ref 0–99)
NonHDL: 89.73
Total CHOL/HDL Ratio: 3
Triglycerides: 53 mg/dL (ref 0.0–149.0)
VLDL: 10.6 mg/dL (ref 0.0–40.0)

## 2022-05-06 NOTE — Progress Notes (Signed)
Subjective:    Melanie Jordan is a 31 y.o. female and is here for a comprehensive physical exam.  HPI  There are no preventive care reminders to display for this patient.   Acute Concerns: None  Chronic Issues: B12 deficiency Patient is requesting to update her blood work today. She states she was receiving weekly/montly injection in 2020 for this. She denies any other concerns today.   Adjustment disorder with anxious mood She received a referral for psychiatry and they cannot see her for at least 3 months, she is requesting another referral today. Denies SI/HI.  Health Maintenance: Immunizations -- UTD PAP -- UTD Bone Density -- N/A Diet -- Balanced  Sleep habits -- No concern  Exercise -- exercise 5 times a week.  Current Weight -- 181 Ib (82.3 kg) Weight History: Wt Readings from Last 10 Encounters:  05/06/22 181 lb 8 oz (82.3 kg)  04/29/22 183 lb 6.4 oz (83.2 kg)  04/08/22 183 lb (83 kg)  11/08/20 167 lb 9.6 oz (76 kg)  11/07/20 165 lb (74.8 kg)  03/28/20 164 lb (74.4 kg)  03/22/20 165 lb 3.2 oz (74.9 kg)  03/14/20 166 lb (75.3 kg)  03/06/20 166 lb (75.3 kg)  01/06/20 158 lb (71.7 kg)   Body mass index is 28.43 kg/m. Mood -- Better   Patient's last menstrual period was 04/14/2022. Period characteristics -- last time had periods for 2 days. Usually last 8 days.  Birth control -- None    reports current alcohol use of about 7.0 standard drinks of alcohol per week.  Tobacco Use: Low Risk  (05/06/2022)   Patient History    Smoking Tobacco Use: Never    Smokeless Tobacco Use: Never    Passive Exposure: Not on file        04/08/2022    8:37 AM  Depression screen PHQ 2/9  Decreased Interest 1  Down, Depressed, Hopeless 1  PHQ - 2 Score 2  Altered sleeping 1  Tired, decreased energy 2  Change in appetite 1  Feeling bad or failure about yourself  0  Trouble concentrating 3  Moving slowly or fidgety/restless 3  Suicidal thoughts 0  PHQ-9 Score  12  Difficult doing work/chores Very difficult     Other providers/specialists: Patient Care Team: Inda Coke, [place] as PCP - General (Physician Assistant) Leonie Man, MD as PCP - Cardiology (Cardiology) Clark-Burning, Anderson Malta, PA-C (Inactive) (Dermatology)   PMHx, SurgHx, SocialHx, Medications, and Allergies were reviewed in the Visit Navigator and updated as appropriate.   Past Medical History:  Diagnosis Date   ADHD    now off Adderal    Allergy    Anxiety    Chronic head pain    taking Metoprolol   Depression    GERD (gastroesophageal reflux disease)    H/O endoscopy 2020   IBS (irritable bowel syndrome)    Migraine      Past Surgical History:  Procedure Laterality Date   ESOPHAGEAL MANOMETRY N/A 10/13/2019   Procedure: ESOPHAGEAL MANOMETRY (EM);  Surgeon: Mauri Pole, MD;  Location: [facility];  Service: Endoscopy;  Laterality: N/A;   TRANSTHORACIC ECHOCARDIOGRAM  03/2020   EF 60 to 65%..  Normal valves.  Normal chamber sizes.  No pericardial effusion..     Family History  Problem Relation Age of Onset   Cancer Mother        cervical and stomach cancer   Fibroids Mother        uterine fibroid  Thyroid disease Mother        hypothyroidism due to [place]   Osteopenia Mother    GER disease Mother    Drug abuse Mother    Stomach cancer Mother    Anxiety disorder Mother    Arthritis Mother    Asthma Brother    Anxiety disorder Brother    Irritable bowel syndrome Brother    Arthritis Maternal Grandmother    Depression Maternal Grandmother    Heart disease Maternal Grandmother        CAD with stents   Hyperlipidemia Maternal Grandmother    Hypertension Maternal Grandmother    Stroke Maternal Grandmother        TIA   Cancer Paternal Grandmother        uterine cancer   Asthma Brother    Anxiety disorder Sister    Depression Father    ADD / ADHD Maternal Uncle    Colon cancer Neg Hx    Esophageal cancer Neg Hx    Rectal cancer  Neg Hx     Social History   Tobacco Use   Smoking status: Never   Smokeless tobacco: Never  Vaping Use   Vaping Use: Never used  Substance Use Topics   Alcohol use: Yes    Alcohol/week: 7.0 standard drinks of alcohol    Types: 7 Cans of beer per week    Comment: Socially   Drug use: Never    Review of Systems:   Review of Systems  Constitutional:  Negative for chills, fever, malaise/fatigue and weight loss.  HENT:  Negative for hearing loss, sinus pain and sore throat.   Respiratory:  Negative for cough and hemoptysis.   Cardiovascular:  Negative for chest pain, palpitations, leg swelling and PND.  Gastrointestinal:  Negative for abdominal pain, constipation, diarrhea, heartburn, nausea and vomiting.  Genitourinary:  Negative for dysuria, frequency and urgency.  Musculoskeletal:  Negative for back pain, myalgias and neck pain.  Skin:  Negative for itching and rash.  Neurological:  Negative for dizziness, tingling, seizures and headaches.  Endo/Heme/Allergies:  Negative for polydipsia.  Psychiatric/Behavioral:  Negative for depression. The patient is not nervous/anxious.      Objective:   BP 110/70 (BP Location: Left Arm, Patient Position: Sitting, Cuff Size: Large)   Pulse 67   Temp 98.3 F (36.8 C) (Temporal)   Ht '5\' 7"'$  (1.702 m)   Wt 181 lb 8 oz (82.3 kg)   LMP 04/14/2022   SpO2 98%   BMI 28.43 kg/m   General Appearance:    Alert, cooperative, no distress, appears stated age  Head:    Normocephalic, without obvious abnormality, atraumatic  Eyes:    PERRL, conjunctiva/corneas clear, EOM's intact, fundi    benign, both eyes  Ears:    Normal TM's and external ear canals, both ears  Nose:   Nares normal, septum midline, mucosa normal, no drainage    or sinus tenderness  Throat:   Lips, mucosa, and tongue normal; teeth and gums normal  Neck:   Supple, symmetrical, trachea midline, no adenopathy;    thyroid:  no enlargement/tenderness/nodules; no carotid   bruit  or JVD  Back:     Symmetric, no curvature, ROM normal, no CVA tenderness  Lungs:     Clear to auscultation bilaterally, respirations unlabored  Chest Wall:    No tenderness or deformity   Heart:    Regular rate and rhythm, S1 and S2 normal, no murmur, rub   or gallop  Breast Exam:  Deferred  Abdomen:     Soft, non-tender, bowel sounds active all four quadrants,    no masses, no organomegaly  Genitalia:    Deferred  Rectal:    Deferred  Extremities:   Extremities normal, atraumatic, no cyanosis or edema  Pulses:   2+ and symmetric all extremities  Skin:   Skin color, texture, turgor normal, no rashes or lesions  Lymph nodes:   Cervical, supraclavicular, and axillary nodes normal  Neurologic:   CNII-XII intact, normal strength, sensation and reflexes    throughout    Assessment/Plan:   Routine physical examination Today patient counseled on age appropriate routine health concerns for screening and prevention, each reviewed and up to date or declined. Immunizations reviewed and up to date or declined. Labs ordered and reviewed. Risk factors for depression reviewed and negative. Hearing function and visual acuity are intact. ADLs screened and addressed as needed. Functional ability and level of safety reviewed and appropriate. Education, counseling and referrals performed based on assessed risks today. Patient provided with a copy of personalized plan for preventive services.   B12 deficiency Update B12 and provide recommendations accordingly   Adjustment disorder with anxious mood Overall uncontrolled per patient but denies SI/HI Declines further intervention outside of new psychiatry referral   Screening for HIV (human immunodeficiency virus) Update HIV screen   Encounter for screening for other viral diseases Update Hep C screen   Vitamin D deficiency Update Vit D and provide recommendations accordingly   Overweight Work on healthy diet and exercise as able   Patient  Counseling:   '[x]'$     Nutrition: Stressed importance of moderation in sodium/caffeine intake, saturated fat and cholesterol, caloric balance, sufficient intake of fresh fruits, vegetables, fiber, calcium, iron, and 1 mg of folate supplement per day (for females capable of pregnancy).   '[x]'$      Stressed the importance of regular exercise.    '[x]'$     Substance Abuse: Discussed cessation/primary prevention of tobacco, alcohol, or other drug use; driving or other dangerous activities under the influence; availability of treatment for abuse.    '[x]'$      Injury prevention: Discussed safety belts, safety helmets, smoke detector, smoking near bedding or upholstery.    '[x]'$      Sexuality: Discussed sexually transmitted diseases, partner selection, use of condoms, avoidance of unintended pregnancy  and contraceptive alternatives.    '[x]'$     Dental health: Discussed importance of regular tooth brushing, flossing, and dental visits.   '[x]'$      Health maintenance and immunizations reviewed. Please refer to Health maintenance section.    I,Savera Zaman,acting as a Education administrator for [employer], PA.,have documented all relevant documentation on the behalf of Inda Coke, PA,as directed by  Inda Coke, PA while in the presence of Inda Coke, [place].   I, Inda Coke, [place], have reviewed all documentation for this visit. The documentation on 05/06/22 for the exam, diagnosis, procedures, and orders are all accurate and complete.   Inda Coke, PA-C [place]

## 2022-05-06 NOTE — Addendum Note (Signed)
Addended by: Erlene Quan on: 05/06/2022 11:10 AM   Modules accepted: Orders

## 2022-05-06 NOTE — Patient Instructions (Addendum)
It was great to see you!  Please call and schedule with [facility] -- I will also place referral [phone] Appointments: [email or web address]   Please go to the lab for blood work.   Our office will call you with your results unless you have chosen to receive results via MyChart.  If your blood work is normal we will follow-up each year for physicals and as scheduled for chronic medical problems.  If anything is abnormal we will treat accordingly and get you in for a follow-up.  Take care,  Aldona Bar

## 2022-05-07 LAB — HEPATITIS C ANTIBODY
Hepatitis C Ab: NONREACTIVE
SIGNAL TO CUT-OFF: 0.12 (ref <1.00)

## 2022-05-07 LAB — HIV ANTIBODY (ROUTINE TESTING W REFLEX): HIV 1&2 Ab, 4th Generation: NONREACTIVE

## 2022-05-08 ENCOUNTER — Encounter: Payer: Self-pay | Admitting: Physician Assistant

## 2022-05-08 ENCOUNTER — Other Ambulatory Visit: Payer: Self-pay | Admitting: Physician Assistant

## 2022-05-08 ENCOUNTER — Encounter: Payer: Self-pay | Admitting: Family Medicine

## 2022-05-08 DIAGNOSIS — E559 Vitamin D deficiency, unspecified: Secondary | ICD-10-CM

## 2022-05-08 DIAGNOSIS — E785 Hyperlipidemia, unspecified: Secondary | ICD-10-CM

## 2022-05-19 ENCOUNTER — Encounter: Payer: Self-pay | Admitting: Physician Assistant

## 2022-05-19 ENCOUNTER — Telehealth: Payer: No Typology Code available for payment source | Admitting: Family

## 2022-05-19 DIAGNOSIS — B3731 Acute candidiasis of vulva and vagina: Secondary | ICD-10-CM

## 2022-05-19 DIAGNOSIS — R3989 Other symptoms and signs involving the genitourinary system: Secondary | ICD-10-CM

## 2022-05-19 MED ORDER — FLUCONAZOLE 150 MG PO TABS: 150.0000 mg | ORAL_TABLET | ORAL | 0 refills | Status: DC | PRN

## 2022-05-20 MED ORDER — CEPHALEXIN 500 MG PO CAPS: 500.0000 mg | ORAL_CAPSULE | Freq: Two times a day (BID) | ORAL | 0 refills | Status: DC

## 2022-05-31 ENCOUNTER — Other Ambulatory Visit: Payer: Self-pay

## 2022-06-03 ENCOUNTER — Ambulatory Visit: Payer: No Typology Code available for payment source | Admitting: Physical Therapy

## 2022-06-10 ENCOUNTER — Ambulatory Visit: Payer: No Typology Code available for payment source | Admitting: Family Medicine

## 2022-06-12 ENCOUNTER — Encounter: Payer: Self-pay | Admitting: Physician Assistant

## 2022-06-17 ENCOUNTER — Ambulatory Visit: Payer: No Typology Code available for payment source | Admitting: Physical Therapy

## 2022-07-24 ENCOUNTER — Other Ambulatory Visit: Payer: Self-pay

## 2022-07-24 MED ORDER — AIMOVIG 140 MG/ML ~~LOC~~ SOAJ
SUBCUTANEOUS | 11 refills | Status: DC
Start: 2022-07-24 — End: 2023-05-30
  Filled 2022-07-24 – 2022-07-25 (×2): qty 1, 30d supply, fill #0
  Filled 2022-08-19: qty 1, 30d supply, fill #1

## 2022-07-25 ENCOUNTER — Other Ambulatory Visit: Payer: Self-pay

## 2022-08-06 ENCOUNTER — Telehealth: Payer: No Typology Code available for payment source | Admitting: Nurse Practitioner

## 2022-08-06 ENCOUNTER — Other Ambulatory Visit: Payer: Self-pay

## 2022-08-06 DIAGNOSIS — L089 Local infection of the skin and subcutaneous tissue, unspecified: Secondary | ICD-10-CM | POA: Diagnosis not present

## 2022-08-06 MED ORDER — MUPIROCIN 2 % EX OINT
1.0000 | TOPICAL_OINTMENT | Freq: Two times a day (BID) | CUTANEOUS | 0 refills | Status: AC
  Filled 2022-08-06: qty 22, 7d supply, fill #0

## 2022-08-06 MED ORDER — SULFAMETHOXAZOLE-TRIMETHOPRIM 800-160 MG PO TABS
1.0000 | ORAL_TABLET | Freq: Two times a day (BID) | ORAL | 0 refills | Status: AC
  Filled 2022-08-06: qty 14, 7d supply, fill #0

## 2022-08-06 NOTE — Addendum Note (Signed)
Addended by: Apolonio Schneiders E on: 08/06/2022 10:08 AM   Modules accepted: Orders

## 2022-08-06 NOTE — Progress Notes (Signed)
E Visit for Cellulitis  We are sorry that you are not feeling well. Here is how we plan to help!  Based on what you shared with me it looks like you have cellulitis.  Cellulitis looks like areas of skin redness, swelling, and warmth; it develops as a result of bacteria entering under the skin. Little red spots and/or bleeding can be seen in skin, and tiny surface sacs containing fluid can occur. Fever can be present. Cellulitis is almost always on one side of a body, and the lower limbs are the most common site of involvement.   I have prescribed:  Bactrim DS 1 tablet by mouth twice a day for 7 days  HOME CARE:  Take your medications as ordered and take all of them, even if the skin irritation appears to be healing.   GET HELP RIGHT AWAY IF:  Symptoms that don't begin to go away within 48 hours. Severe redness persists or worsens If the area turns color, spreads or swells. If it blisters and opens, develops yellow-brown crust or bleeds. You develop a fever or chills. If the pain increases or becomes unbearable.  Are unable to keep fluids and food down.  MAKE SURE YOU   Understand these instructions. Will watch your condition. Will get help right away if you are not doing well or get worse.  Thank you for choosing an e-visit.  Your e-visit answers were reviewed by a board certified advanced clinical practitioner to complete your personal care plan. Depending upon the condition, your plan could have included both over the counter or prescription medications.  Please review your pharmacy choice. Make sure the pharmacy is open so you can pick up prescription now. If there is a problem, you may contact your provider through [employer] and have the prescription routed to another pharmacy.  Your safety is important to [place]. If you have drug allergies check your prescription carefully.   For the next 24 hours you can use MyChart to ask questions about today's visit, request a  non-urgent call back, or ask for a work or school excuse. You will get an email in the next two days asking about your experience. I hope that your e-visit has been valuable and will speed your recovery.   Meds ordered this encounter  Medications   sulfamethoxazole-trimethoprim (BACTRIM DS) 800-160 MG tablet    Sig: Take 1 tablet by mouth 2 (two) times daily for 7 days.    Dispense:  14 tablet    Refill:  0     I spent approximately 5 minutes reviewing the patient's history, current symptoms and coordinating their plan of care today.

## 2022-08-19 ENCOUNTER — Other Ambulatory Visit: Payer: Self-pay

## 2022-08-19 ENCOUNTER — Encounter: Payer: Self-pay | Admitting: *Deleted

## 2022-08-23 ENCOUNTER — Other Ambulatory Visit: Payer: Self-pay

## 2022-08-28 ENCOUNTER — Encounter: Payer: Self-pay | Admitting: Pharmacist

## 2022-08-28 ENCOUNTER — Other Ambulatory Visit: Payer: Self-pay

## 2022-08-28 MED ORDER — MYDAYIS 50 MG PO CP24
50.0000 mg | ORAL_CAPSULE | Freq: Every day | ORAL | 0 refills | Status: DC
Start: 2022-08-28 — End: 2023-05-30
  Filled 2022-08-28: qty 30, 30d supply, fill #0

## 2022-08-29 ENCOUNTER — Other Ambulatory Visit: Payer: Self-pay

## 2022-10-23 LAB — HEPATITIS C ANTIBODY: HCV Ab: NEGATIVE

## 2022-10-23 LAB — OB RESULTS CONSOLE RUBELLA ANTIBODY, IGM: Rubella: IMMUNE

## 2022-10-23 LAB — OB RESULTS CONSOLE RPR: RPR: NONREACTIVE

## 2022-10-23 LAB — OB RESULTS CONSOLE HIV ANTIBODY (ROUTINE TESTING): HIV: NONREACTIVE

## 2022-10-23 LAB — OB RESULTS CONSOLE ABO/RH: RH Type: POSITIVE

## 2022-10-23 LAB — OB RESULTS CONSOLE HEPATITIS B SURFACE ANTIGEN: Hepatitis B Surface Ag: NEGATIVE

## 2022-10-23 LAB — OB RESULTS CONSOLE ANTIBODY SCREEN: Antibody Screen: NEGATIVE

## 2022-11-04 LAB — OB RESULTS CONSOLE GC/CHLAMYDIA
Chlamydia: NEGATIVE
Neisseria Gonorrhea: NEGATIVE

## 2022-11-25 NOTE — L&D Delivery Note (Signed)
Delivery Note At 9:26 PM a viable female was delivered via Vaginal, Spontaneous (Presentation:  LOA    ).  APGAR: 8, 9; weight  .   Placenta status: Spontaneous, Intact.  Cord: 3 vessels with the following complications: None.    Anesthesia: Epidural Episiotomy:  None Lacerations:  2nd degree perineal Suture Repair: 2.0 3.0 vicryl rapide Est. Blood Loss (mL):  215  Mom to postpartum.  Baby to Couplet care / Skin to Skin.  Lyn Henri 05/28/2023, 9:55 PM

## 2022-12-05 DIAGNOSIS — Z361 Encounter for antenatal screening for raised alphafetoprotein level: Secondary | ICD-10-CM | POA: Diagnosis not present

## 2023-01-06 DIAGNOSIS — Z363 Encounter for antenatal screening for malformations: Secondary | ICD-10-CM | POA: Diagnosis not present

## 2023-01-06 DIAGNOSIS — Z3A19 19 weeks gestation of pregnancy: Secondary | ICD-10-CM | POA: Diagnosis not present

## 2023-01-06 DIAGNOSIS — Z3400 Encounter for supervision of normal first pregnancy, unspecified trimester: Secondary | ICD-10-CM | POA: Diagnosis not present

## 2023-02-03 DIAGNOSIS — Z362 Encounter for other antenatal screening follow-up: Secondary | ICD-10-CM | POA: Diagnosis not present

## 2023-02-03 DIAGNOSIS — Z3A23 23 weeks gestation of pregnancy: Secondary | ICD-10-CM | POA: Diagnosis not present

## 2023-02-03 DIAGNOSIS — Z3402 Encounter for supervision of normal first pregnancy, second trimester: Secondary | ICD-10-CM | POA: Diagnosis not present

## 2023-03-06 DIAGNOSIS — Z3403 Encounter for supervision of normal first pregnancy, third trimester: Secondary | ICD-10-CM | POA: Diagnosis not present

## 2023-03-06 DIAGNOSIS — Z23 Encounter for immunization: Secondary | ICD-10-CM | POA: Diagnosis not present

## 2023-03-06 DIAGNOSIS — Z3A28 28 weeks gestation of pregnancy: Secondary | ICD-10-CM | POA: Diagnosis not present

## 2023-03-06 DIAGNOSIS — Z3480 Encounter for supervision of other normal pregnancy, unspecified trimester: Secondary | ICD-10-CM | POA: Diagnosis not present

## 2023-03-10 ENCOUNTER — Encounter: Payer: Self-pay | Admitting: *Deleted

## 2023-03-11 DIAGNOSIS — F411 Generalized anxiety disorder: Secondary | ICD-10-CM | POA: Diagnosis not present

## 2023-03-25 DIAGNOSIS — F411 Generalized anxiety disorder: Secondary | ICD-10-CM | POA: Diagnosis not present

## 2023-04-08 DIAGNOSIS — F411 Generalized anxiety disorder: Secondary | ICD-10-CM | POA: Diagnosis not present

## 2023-04-22 DIAGNOSIS — F411 Generalized anxiety disorder: Secondary | ICD-10-CM | POA: Diagnosis not present

## 2023-04-24 DIAGNOSIS — L299 Pruritus, unspecified: Secondary | ICD-10-CM | POA: Diagnosis not present

## 2023-04-30 DIAGNOSIS — Z3685 Encounter for antenatal screening for Streptococcus B: Secondary | ICD-10-CM | POA: Diagnosis not present

## 2023-04-30 LAB — OB RESULTS CONSOLE GBS: GBS: NEGATIVE

## 2023-05-06 DIAGNOSIS — F411 Generalized anxiety disorder: Secondary | ICD-10-CM | POA: Diagnosis not present

## 2023-05-27 ENCOUNTER — Encounter (HOSPITAL_COMMUNITY): Payer: Self-pay | Admitting: *Deleted

## 2023-05-27 ENCOUNTER — Telehealth (HOSPITAL_COMMUNITY): Payer: Self-pay | Admitting: *Deleted

## 2023-05-27 NOTE — Telephone Encounter (Signed)
Preadmission screen  

## 2023-05-28 ENCOUNTER — Inpatient Hospital Stay (HOSPITAL_COMMUNITY)
Admission: RE | Admit: 2023-05-28 | Discharge: 2023-05-30 | DRG: 807 | Disposition: A | Discharge status: Home / Self Care | Payer: 59 | Attending: Obstetrics and Gynecology | Admitting: Obstetrics and Gynecology

## 2023-05-28 ENCOUNTER — Encounter (HOSPITAL_COMMUNITY): Payer: Self-pay | Admitting: Obstetrics & Gynecology

## 2023-05-28 ENCOUNTER — Inpatient Hospital Stay (HOSPITAL_COMMUNITY)
Admission: AD | Admit: 2023-05-28 | Discharge: 2023-05-28 | Disposition: A | Discharge status: Home / Self Care | Payer: 59 | Attending: Obstetrics & Gynecology | Admitting: Obstetrics & Gynecology

## 2023-05-28 ENCOUNTER — Inpatient Hospital Stay (HOSPITAL_COMMUNITY): Payer: 59 | Admitting: Anesthesiology

## 2023-05-28 DIAGNOSIS — O26893 Other specified pregnancy related conditions, third trimester: Secondary | ICD-10-CM | POA: Diagnosis present

## 2023-05-28 DIAGNOSIS — O479 False labor, unspecified: Secondary | ICD-10-CM

## 2023-05-28 DIAGNOSIS — Z3A40 40 weeks gestation of pregnancy: Secondary | ICD-10-CM | POA: Diagnosis not present

## 2023-05-28 DIAGNOSIS — O99824 Streptococcus B carrier state complicating childbirth: Secondary | ICD-10-CM | POA: Diagnosis not present

## 2023-05-28 DIAGNOSIS — Z3A00 Weeks of gestation of pregnancy not specified: Secondary | ICD-10-CM | POA: Diagnosis not present

## 2023-05-28 DIAGNOSIS — Z3490 Encounter for supervision of normal pregnancy, unspecified, unspecified trimester: Secondary | ICD-10-CM

## 2023-05-28 LAB — CBC
HCT: 38.7 % (ref 36.0–46.0)
Hemoglobin: 13.1 g/dL (ref 12.0–15.0)
MCH: 30.1 pg (ref 26.0–34.0)
MCHC: 33.9 g/dL (ref 30.0–36.0)
MCV: 89 fL (ref 80.0–100.0)
Platelets: 160 10*3/uL (ref 150–400)
RBC: 4.35 MIL/uL (ref 3.87–5.11)
RDW: 12.8 % (ref 11.5–15.5)
WBC: 15 10*3/uL — ABNORMAL HIGH (ref 4.0–10.5)
nRBC: 0 % (ref 0.0–0.2)

## 2023-05-28 LAB — TYPE AND SCREEN
ABO/RH(D): A POS
Antibody Screen: NEGATIVE

## 2023-05-28 LAB — RPR: RPR Ser Ql: NONREACTIVE

## 2023-05-28 LAB — HIV ANTIBODY (ROUTINE TESTING W REFLEX): HIV Screen 4th Generation wRfx: NONREACTIVE

## 2023-05-28 MED ORDER — FENTANYL-BUPIVACAINE-NACL 0.5-0.125-0.9 MG/250ML-% EP SOLN
12.0000 mL/h | EPIDURAL | Status: DC | PRN
  Administered 2023-05-28: 12 mL/h via EPIDURAL
  Filled 2023-05-28: qty 250

## 2023-05-28 MED ORDER — PHENYLEPHRINE 80 MCG/ML (10ML) SYRINGE FOR IV PUSH (FOR BLOOD PRESSURE SUPPORT)
80.0000 ug | PREFILLED_SYRINGE | INTRAVENOUS | Status: DC | PRN
  Administered 2023-05-28: 80 ug via INTRAVENOUS

## 2023-05-28 MED ORDER — ACETAMINOPHEN 325 MG PO TABS: 650.0000 mg | ORAL_TABLET | ORAL | Status: DC | PRN

## 2023-05-28 MED ORDER — LACTATED RINGERS IV SOLN
500.0000 mL | Freq: Once | INTRAVENOUS | Status: AC
  Administered 2023-05-28: 500 mL via INTRAVENOUS

## 2023-05-28 MED ORDER — LIDOCAINE-EPINEPHRINE (PF) 2 %-1:200000 IJ SOLN
INTRAMUSCULAR | Status: DC | PRN
  Administered 2023-05-28 (×2): 5 mL via EPIDURAL

## 2023-05-28 MED ORDER — LACTATED RINGERS IV SOLN: 500.0000 mL | Freq: Once | INTRAVENOUS | Status: AC

## 2023-05-28 MED ORDER — DIPHENHYDRAMINE HCL 50 MG/ML IJ SOLN: 12.5000 mg | INTRAMUSCULAR | Status: DC | PRN

## 2023-05-28 MED ORDER — METOCLOPRAMIDE HCL 5 MG/ML IJ SOLN
10.0000 mg | Freq: Once | INTRAMUSCULAR | Status: AC
  Administered 2023-05-28: 10 mg via INTRAVENOUS
  Filled 2023-05-28: qty 2

## 2023-05-28 MED ORDER — FENTANYL CITRATE (PF) 100 MCG/2ML IJ SOLN: 50.0000 ug | INTRAMUSCULAR | Status: DC | PRN

## 2023-05-28 MED ORDER — TERBUTALINE SULFATE 1 MG/ML IJ SOLN: 0.2500 mg | Freq: Once | INTRAMUSCULAR | Status: DC | PRN

## 2023-05-28 MED ORDER — TRANEXAMIC ACID-NACL 1000-0.7 MG/100ML-% IV SOLN
INTRAVENOUS | Status: AC
  Administered 2023-05-28: 1000 mg via INTRAVENOUS
  Filled 2023-05-28: qty 100

## 2023-05-28 MED ORDER — LACTATED RINGERS IV SOLN
500.0000 mL | INTRAVENOUS | Status: DC | PRN
  Administered 2023-05-28: 1000 mL via INTRAVENOUS
  Administered 2023-05-28: 500 mL via INTRAVENOUS

## 2023-05-28 MED ORDER — OXYTOCIN-SODIUM CHLORIDE 30-0.9 UT/500ML-% IV SOLN: 2.5000 [IU]/h | INTRAVENOUS | Status: DC

## 2023-05-28 MED ORDER — SODIUM CHLORIDE 0.9 % IV SOLN
12.5000 mg | Freq: Four times a day (QID) | INTRAVENOUS | Status: DC | PRN
  Administered 2023-05-28: 12.5 mg via INTRAVENOUS
  Filled 2023-05-28: qty 12.5
  Filled 2023-05-28: qty 0.5

## 2023-05-28 MED ORDER — OXYTOCIN BOLUS FROM INFUSION
333.0000 mL | Freq: Once | INTRAVENOUS | Status: AC
  Administered 2023-05-28: 333 mL via INTRAVENOUS

## 2023-05-28 MED ORDER — SOD CITRATE-CITRIC ACID 500-334 MG/5ML PO SOLN: 30.0000 mL | ORAL | Status: DC | PRN

## 2023-05-28 MED ORDER — EPHEDRINE 5 MG/ML INJ
10.0000 mg | INTRAVENOUS | Status: DC | PRN
  Filled 2023-05-28: qty 5

## 2023-05-28 MED ORDER — PHENYLEPHRINE 80 MCG/ML (10ML) SYRINGE FOR IV PUSH (FOR BLOOD PRESSURE SUPPORT)
80.0000 ug | PREFILLED_SYRINGE | INTRAVENOUS | Status: DC | PRN
  Filled 2023-05-28 (×2): qty 10

## 2023-05-28 MED ORDER — OXYTOCIN-SODIUM CHLORIDE 30-0.9 UT/500ML-% IV SOLN
1.0000 m[IU]/min | INTRAVENOUS | Status: DC
  Administered 2023-05-28: 2 m[IU]/min via INTRAVENOUS
  Filled 2023-05-28: qty 500

## 2023-05-28 MED ORDER — OXYCODONE-ACETAMINOPHEN 5-325 MG PO TABS: 2.0000 | ORAL_TABLET | ORAL | Status: DC | PRN

## 2023-05-28 MED ORDER — TRANEXAMIC ACID-NACL 1000-0.7 MG/100ML-% IV SOLN: 1000.0000 mg | INTRAVENOUS | Status: DC

## 2023-05-28 MED ORDER — ONDANSETRON HCL 4 MG/2ML IJ SOLN
4.0000 mg | Freq: Four times a day (QID) | INTRAMUSCULAR | Status: DC | PRN
  Filled 2023-05-28: qty 2

## 2023-05-28 MED ORDER — LIDOCAINE HCL (PF) 1 % IJ SOLN
30.0000 mL | INTRAMUSCULAR | Status: AC | PRN
  Administered 2023-05-28: 30 mL via SUBCUTANEOUS
  Filled 2023-05-28: qty 30

## 2023-05-28 MED ORDER — EPHEDRINE 5 MG/ML INJ
10.0000 mg | INTRAVENOUS | Status: DC | PRN
  Administered 2023-05-28: 10 mg via INTRAVENOUS

## 2023-05-28 MED ORDER — TRANEXAMIC ACID-NACL 1000-0.7 MG/100ML-% IV SOLN: 1000.0000 mg | Freq: Once | INTRAVENOUS | Status: AC

## 2023-05-28 MED ORDER — LACTATED RINGERS IV SOLN: INTRAVENOUS | Status: DC

## 2023-05-28 MED ORDER — OXYCODONE-ACETAMINOPHEN 5-325 MG PO TABS: 1.0000 | ORAL_TABLET | ORAL | Status: DC | PRN

## 2023-05-28 NOTE — Anesthesia Preprocedure Evaluation (Signed)
Anesthesia Evaluation  Patient identified by MRN, date of birth, ID band Patient awake    Reviewed: Allergy & Precautions, Patient's Chart, lab work & pertinent test results  Airway Mallampati: II  TM Distance: >3 FB Neck ROM: Full    Dental  (+) Teeth Intact   Pulmonary    breath sounds clear to auscultation       Cardiovascular negative cardio ROS  Rhythm:Regular Rate:Normal     Neuro/Psych    GI/Hepatic Neg liver ROS,GERD  ,,  Endo/Other    Renal/GU      Musculoskeletal   Abdominal   Peds  Hematology   Anesthesia Other Findings   Reproductive/Obstetrics (+) Pregnancy                             Anesthesia Physical Anesthesia Plan  ASA: 2  Anesthesia Plan: Epidural   Post-op Pain Management:    Induction:   PONV Risk Score and Plan: 0  Airway Management Planned: Natural Airway  Additional Equipment: None  Intra-op Plan:   Post-operative Plan:   Informed Consent: I have reviewed the patients History and Physical, chart, labs and discussed the procedure including the risks, benefits and alternatives for the proposed anesthesia with the patient or authorized representative who has indicated his/her understanding and acceptance.       Plan Discussed with:   Anesthesia Plan Comments: (Lab Results      Component                Value               Date                      WBC                      15.0 (H)            05/28/2023                HGB                      13.1                05/28/2023                HCT                      38.7                05/28/2023                MCV                      89.0                05/28/2023                PLT                      160                 05/28/2023           )       Anesthesia Quick Evaluation

## 2023-05-28 NOTE — Progress Notes (Signed)
Labor Progress Note  Patient has been with intermittent hypotension and symptomatic, responsive to phenylephrine.  There have been no fetal heart tracing issues.  Epidural working now and cervix with interval change to 9 / 100 / -1  Head well applied, AROM completed in standard fashion with return of clear fluid.  FHT cat 1 with accels  Continue current management.   Nilda Simmer

## 2023-05-28 NOTE — Anesthesia Procedure Notes (Signed)
Epidural Patient location during procedure: OB Start time: 05/28/2023 8:51 AM End time: 05/28/2023 8:57 AM  Staffing Anesthesiologist: Shelton Silvas, MD Performed: anesthesiologist   Preanesthetic Checklist Completed: patient identified, IV checked, site marked, risks and benefits discussed, surgical consent, monitors and equipment checked, pre-op evaluation and timeout performed  Epidural Patient position: sitting Prep: DuraPrep Patient monitoring: heart rate, continuous pulse ox and blood pressure Approach: midline Location: L3-L4 Injection technique: LOR saline  Needle:  Needle type: Tuohy  Needle gauge: 17 G Needle length: 9 cm Catheter type: closed end flexible Catheter size: 20 Guage Test dose: negative and 1.5% lidocaine  Assessment Events: blood not aspirated, no cerebrospinal fluid, injection not painful, no injection resistance and no paresthesia  Additional Notes LOR @ 4  Patient identified. Risks/Benefits/Options discussed with patient including but not limited to bleeding, infection, nerve damage, paralysis, failed block, incomplete pain control, headache, blood pressure changes, nausea, vomiting, reactions to medications, itching and postpartum back pain. Confirmed with bedside nurse the patient's most recent platelet count. Confirmed with patient that they are not currently taking any anticoagulation, have any bleeding history or any family history of bleeding disorders. Patient expressed understanding and wished to proceed. All questions were answered. Sterile technique was used throughout the entire procedure. Please see nursing notes for vital signs. Test dose was given through epidural catheter and negative prior to continuing to dose epidural or start infusion. Warning signs of high block given to the patient including shortness of breath, tingling/numbness in hands, complete motor block, or any concerning symptoms with instructions to call for help. Patient was  given instructions on fall risk and not to get out of bed. All questions and concerns addressed with instructions to call with any issues or inadequate analgesia.    Reason for block:procedure for pain

## 2023-05-28 NOTE — H&P (Signed)
OB History and Physical   Melanie Jordan is a 32 y.o. female G1P0 presenting for elective induction of labor at [redacted]w[redacted]d  Pregnancy course is notable for Hx of PVCs followed by cardiology.  She was seen in MAU overnight for rule out labor.   Rh positive, GBS negative, panorama low risk.   OB History     Gravida  1   Para      Term      Preterm      AB      Living         SAB      IAB      Ectopic      Multiple      Live Births             Past Medical History:  Diagnosis Date   ADHD    now off Adderal    Allergy    Anxiety    Chronic head pain    taking Metoprolol   Depression    GERD (gastroesophageal reflux disease)    H/O endoscopy 2020   IBS (irritable bowel syndrome)    Migraine    Past Surgical History:  Procedure Laterality Date   ESOPHAGEAL MANOMETRY N/A 10/13/2019   Procedure: ESOPHAGEAL MANOMETRY (EM);  Surgeon: Napoleon Form, MD;  Location: [facility];  Service: Endoscopy;  Laterality: N/A;   TRANSTHORACIC ECHOCARDIOGRAM  03/2020   EF 60 to 65%..  Normal valves.  Normal chamber sizes.  No pericardial effusion..   Family History: family history includes ADD / ADHD in her maternal uncle; Anxiety disorder in her brother, mother, and sister; Arthritis in her maternal grandmother and mother; Asthma in her brother and brother; Cancer in her mother and paternal grandmother; Depression in her father and maternal grandmother; Drug abuse in her mother; Fibroids in her mother; GER disease in her mother; Heart disease in her maternal grandmother; Hyperlipidemia in her maternal grandmother; Hypertension in her maternal grandmother; Irritable bowel syndrome in her brother and mother; Osteopenia in her mother; Stomach cancer in her mother; Stroke in her maternal grandmother; Thyroid disease in her mother. Social History:  reports that she has never smoked. She has never used smokeless tobacco. She reports that she does not currently use alcohol after a  past usage of about 7.0 standard drinks of alcohol per week. She reports that she does not use drugs.     Maternal Diabetes: No Genetic Screening: Normal Maternal Ultrasounds/Referrals: Normal Fetal Ultrasounds or other Referrals:  None Maternal Substance Abuse:  No Significant Maternal Medications:  None Significant Maternal Lab Results:  Group B Strep positive Other Comments:  None  Review of Systems - Patient denies fever, chills, SOB, CP, N/V/D.  History   There were no vitals taken for this visit. Exam Physical Exam   Gen: alert, well appearing, no distress Chest: nonlabored breathing CV: no peripheral edema Abdomen: soft, gravid  Ext: no evidence of DVT  Prenatal labs: ABO, Rh: A/Positive/-- (11/29 0000) Antibody: Negative (11/29 0000) Rubella: Immune (11/29 0000) RPR: Nonreactive (11/29 0000)  HBsAg: Negative (11/29 0000)  HIV: Non-reactive (11/29 0000)  GBS: Negative/-- (06/05 0000)   Assessment/Plan: Admit to Labor and Delivery GBS negative pitocin, AROM for induction Epidural when desired Anticipate vaginal delivery   Lyn Henri 05/28/2023, 7:46 AM

## 2023-05-28 NOTE — MAU Provider Note (Signed)
S: Ms. Melanie Jordan is a 32 y.o. G1P0 at [redacted]w[redacted]d  who presents to MAU today for labor evaluation.     Cervical exam by RN:  Dilation: 2.5 Effacement (%): 80 Cervical Position: Posterior Station: -2 Presentation: Vertex Exam by:: Santiago Bur, RN No change after one hour  Fetal Monitoring: Baseline: 130 Variability: average Accelerations: present Decelerations: absent Contractions: irregular  MDM Discussed patient with RN. NST reviewed.   A: SIUP at [redacted]w[redacted]d  False labor  P: Discharge home Labor precautions and kick counts included in AVS Patient to follow-up with office as scheduled  Patient may return to MAU as needed or when in labor   Aviva Signs, [place] 05/28/2023 3:48 AM

## 2023-05-28 NOTE — MAU Note (Addendum)
.  Melanie Jordan is a 32 y.o. at [redacted]w[redacted]d here in MAU reporting ctxs since Tues morning which have gradually progressed to being closer and stronger. Denies LOF or VB. Reports thinking FM may possibly be alittle decreased but unsure if she has focused more on the pain than FM. Pt was 2cm on Monday Onset of complaint: Tues am Pain score: 7 Vitals:   05/28/23 0147 05/28/23 0150  BP:  115/74  Pulse: 88   Resp: 18   Temp: 97.9 F (36.6 C)   SpO2: 100%      FHT:136 Lab orders placed from triage:  labor eval

## 2023-05-29 LAB — CBC
HCT: 32.6 % — ABNORMAL LOW (ref 36.0–46.0)
Hemoglobin: 10.9 g/dL — ABNORMAL LOW (ref 12.0–15.0)
MCH: 30.7 pg (ref 26.0–34.0)
MCHC: 33.4 g/dL (ref 30.0–36.0)
MCV: 91.8 fL (ref 80.0–100.0)
Platelets: 162 10*3/uL (ref 150–400)
RBC: 3.55 MIL/uL — ABNORMAL LOW (ref 3.87–5.11)
RDW: 13.1 % (ref 11.5–15.5)
WBC: 18.7 10*3/uL — ABNORMAL HIGH (ref 4.0–10.5)
nRBC: 0 % (ref 0.0–0.2)

## 2023-05-29 MED ORDER — DIBUCAINE (PERIANAL) 1 % EX OINT: 1.0000 | TOPICAL_OINTMENT | CUTANEOUS | Status: DC | PRN

## 2023-05-29 MED ORDER — SENNOSIDES-DOCUSATE SODIUM 8.6-50 MG PO TABS
2.0000 | ORAL_TABLET | ORAL | Status: DC
  Administered 2023-05-29 – 2023-05-30 (×2): 2 via ORAL
  Filled 2023-05-29 (×2): qty 2

## 2023-05-29 MED ORDER — PRENATAL MULTIVITAMIN CH
1.0000 | ORAL_TABLET | Freq: Every day | ORAL | Status: DC
  Administered 2023-05-29 – 2023-05-30 (×2): 1 via ORAL
  Filled 2023-05-29 (×2): qty 1

## 2023-05-29 MED ORDER — BENZOCAINE-MENTHOL 20-0.5 % EX AERO: 1.0000 | INHALATION_SPRAY | CUTANEOUS | Status: DC | PRN

## 2023-05-29 MED ORDER — OXYCODONE HCL 5 MG PO TABS: 5.0000 mg | ORAL_TABLET | ORAL | Status: DC | PRN

## 2023-05-29 MED ORDER — ZOLPIDEM TARTRATE 5 MG PO TABS: 5.0000 mg | ORAL_TABLET | Freq: Every evening | ORAL | Status: DC | PRN

## 2023-05-29 MED ORDER — DIPHENHYDRAMINE HCL 25 MG PO CAPS: 25.0000 mg | ORAL_CAPSULE | Freq: Four times a day (QID) | ORAL | Status: DC | PRN

## 2023-05-29 MED ORDER — TETANUS-DIPHTH-ACELL PERTUSSIS 5-2.5-18.5 LF-MCG/0.5 IM SUSY: 0.5000 mL | PREFILLED_SYRINGE | Freq: Once | INTRAMUSCULAR | Status: DC

## 2023-05-29 MED ORDER — WITCH HAZEL-GLYCERIN EX PADS: 1.0000 | MEDICATED_PAD | CUTANEOUS | Status: DC | PRN

## 2023-05-29 MED ORDER — ONDANSETRON HCL 4 MG PO TABS: 4.0000 mg | ORAL_TABLET | ORAL | Status: DC | PRN

## 2023-05-29 MED ORDER — SIMETHICONE 80 MG PO CHEW: 80.0000 mg | CHEWABLE_TABLET | ORAL | Status: DC | PRN

## 2023-05-29 MED ORDER — ONDANSETRON HCL 4 MG/2ML IJ SOLN: 4.0000 mg | INTRAMUSCULAR | Status: DC | PRN

## 2023-05-29 MED ORDER — ACETAMINOPHEN 325 MG PO TABS
650.0000 mg | ORAL_TABLET | ORAL | Status: DC | PRN
  Administered 2023-05-29 – 2023-05-30 (×2): 650 mg via ORAL
  Filled 2023-05-29 (×2): qty 2

## 2023-05-29 MED ORDER — COCONUT OIL OIL: 1.0000 | TOPICAL_OIL | Status: DC | PRN

## 2023-05-29 MED ORDER — IBUPROFEN 600 MG PO TABS
600.0000 mg | ORAL_TABLET | Freq: Four times a day (QID) | ORAL | Status: DC
  Administered 2023-05-29 – 2023-05-30 (×7): 600 mg via ORAL
  Filled 2023-05-29 (×7): qty 1

## 2023-05-29 NOTE — Progress Notes (Signed)
Postpartum Progress Note  S: Feeling overall well, having increased cramping this AM. Lochia appropriate. No subjective fevers/chills.   O:     05/29/2023    5:30 AM 05/29/2023    1:34 AM 05/29/2023   12:03 AM  Vitals with BMI  Systolic 118 112 161  Diastolic 62 61 66  Pulse 81 82 83    Gen: NAD, A&O Pulm: NWOB Abd: soft, appropriately ttp, fundus firm and below Umb Ext: No evidence of DVT, trace edema b/l  Labs Recent Results (from the past 2160 hour(s))  OB RESULT CONSOLE Group B Strep     Status: None   Collection Time: 04/30/23 12:00 AM  Result Value Ref Range   GBS Negative   CBC     Status: Abnormal   Collection Time: 05/28/23  7:55 AM  Result Value Ref Range   WBC 15.0 (H) 4.0 - 10.5 K/uL   RBC 4.35 3.87 - 5.11 MIL/uL   Hemoglobin 13.1 12.0 - 15.0 g/dL   HCT 09.6 04.5 - 40.9 %   MCV 89.0 80.0 - 100.0 fL   MCH 30.1 26.0 - 34.0 pg   MCHC 33.9 30.0 - 36.0 g/dL   RDW 81.1 91.4 - 78.2 %   Platelets 160 150 - 400 K/uL   nRBC 0.0 0.0 - 0.2 %    Comment: Performed at [facility], [address] [place], [place] [address]  Type and screen [facility]     Status: None   Collection Time: 05/28/23  7:55 AM  Result Value Ref Range   ABO/RH(D) A POS    Antibody Screen NEG    Sample Expiration      05/31/2023,2359 Performed at [facility], [address], [place], [place] [address]   RPR     Status: None   Collection Time: 05/28/23  7:55 AM  Result Value Ref Range   RPR Ser Ql NON REACTIVE NON REACTIVE    Comment: Performed at [facility], [address], [place], [place] [address]  HIV Antibody (routine testing w rflx)     Status: None   Collection Time: 05/28/23  7:55 AM  Result Value Ref Range   HIV Screen 4th Generation wRfx Non Reactive Non Reactive    Comment: Performed at [facility], [address] [place], [place] [address]  CBC     Status: Abnormal   Collection Time: 05/29/23  5:55 AM  Result Value Ref Range    WBC 18.7 (H) 4.0 - 10.5 K/uL   RBC 3.55 (L) 3.87 - 5.11 MIL/uL   Hemoglobin 10.9 (L) 12.0 - 15.0 g/dL   HCT 84.1 (L) 32.4 - 40.1 %   MCV 91.8 80.0 - 100.0 fL   MCH 30.7 26.0 - 34.0 pg   MCHC 33.4 30.0 - 36.0 g/dL   RDW 02.7 25.3 - 66.4 %   Platelets 162 150 - 400 K/uL   nRBC 0.0 0.0 - 0.2 %    Comment: Performed at [facility], [address], [place], [place] [address]     A/P:  PPD1 s/p SVD, doing well pp. AFVSS. Benign exam.  Hx PVCs, followed by cardiology.  Continue present care. Anticipate d/c tomorrow.  Jule Economy, MD

## 2023-05-29 NOTE — Lactation Note (Signed)
This note was copied from a baby's chart. Lactation Consultation Note  Patient Name: Melanie Jordan Today's Date: 05/29/2023 Age:30 hours Reason for consult: Initial assessment;1st time breastfeeding;Primapara;Term Per mom was very tired after delivery, attempted at the breast in L/D.  Received 3 bottles due to baby not latching and mom being tired.  LC offered to assist to see if baby would latch since it has been 3 hours.  Latch score 6.  Baby spit up a  small amount of undigested formula.  Baby STS after attempt.  Mom is a Producer, television/film/video and LC mentioned to mom to go online  [email or web address] and to see what DEBP she desires.  LC recommended to mom to allow the baby to get hungry and less spitty and to call with stronger feeding  cues.   Maternal Data Has patient been taught Hand Expression?: Yes (per mom the nurse showed her . LC reviewed and small drop noted)  Feeding Mother's Current Feeding Choice: Breast Milk and Formula  LATCH Score Latch: Repeated attempts needed to sustain latch, nipple held in mouth throughout feeding, stimulation needed to elicit sucking reflex.  Audible Swallowing: None  Type of Nipple: Everted at rest and after stimulation (areola compressible)  Comfort (Breast/Nipple): Soft / non-tender  Hold (Positioning): Assistance needed to correctly position infant at breast and maintain latch.  LATCH Score: 6   Lactation Tools Discussed/Used    Interventions Interventions: Breast feeding basics reviewed;Assisted with latch;Skin to skin  Discharge Pump: Employee Pump (mom undecided) WIC Program: No  Consult Status Consult Status: Follow-up Date: 05/29/23 Follow-up type: In-patient    Melanie Jordan 05/29/2023, 9:27 AM

## 2023-05-29 NOTE — Anesthesia Postprocedure Evaluation (Signed)
Anesthesia Post Note  Patient: Melanie Jordan  Procedure(s) Performed: AN AD HOC LABOR EPIDURAL     Patient location during evaluation: Mother Baby Anesthesia Type: Epidural Level of consciousness: awake and alert and oriented Pain management: satisfactory to patient Vital Signs Assessment: post-procedure vital signs reviewed and stable Respiratory status: respiratory function stable Cardiovascular status: stable Postop Assessment: no headache, no backache, epidural receding, patient able to bend at knees, no signs of nausea or vomiting, adequate PO intake and able to ambulate Anesthetic complications: no   No notable events documented.  Last Vitals:  Vitals:   05/29/23 0530 05/29/23 0924  BP: 118/62 122/64  Pulse: 81 82  Resp: 16 16  Temp: 36.8 C   SpO2: 100% 100%    Last Pain:  Vitals:   05/29/23 0924  TempSrc:   PainSc: 0-No pain   Pain Goal:                   Eltha Tingley

## 2023-05-29 NOTE — Social Work (Signed)
MOB was referred for history of depression/anxiety.  * Referral screened out by Clinical Social Worker because none of the following criteria appear to apply:  ~ History of anxiety/depression during this pregnancy, or of post-partum depression following prior delivery.  ~ Diagnosis of anxiety and/or depression within last 3 years OR * MOB's symptoms currently being treated with medication and/or therapy. Per OB records MOB is seeing a therapist.   Please contact the Clinical Social Worker if needs arise, by MOB request, or if MOB scores greater than 9/yes to question 10 on Edinburgh Postpartum Depression Screen.  Lillion Elbert, LCSWA Clinical Social Worker [phone] 

## 2023-05-30 NOTE — Discharge Summary (Signed)
Postpartum Discharge Summary  Date of Service May 30, 2023     Patient Name: Melanie Jordan DOB: 11-14-1991 MRN: [id number]  Date of admission: 05/28/2023 Delivery date:05/28/2023  Delivering provider: Damaris Hippo A  Date of discharge: 05/30/2023  Admitting diagnosis: Pregnancy [Z34.90] Intrauterine pregnancy: [redacted]w[redacted]d     Secondary diagnosis:  Principal Problem:   Pregnancy  Additional problems: none    Discharge diagnosis: Term Pregnancy Delivered                                              Post partum procedures: not applicable Augmentation: AROM, Pitocin, and Cytotec Complications: None  Hospital course: Induction of Labor With Vaginal Delivery   32 y.o. yo G1P1001 at [redacted]w[redacted]d was admitted to the hospital 05/28/2023 for induction of labor.  Indication for induction: Favorable cervix at term.  Patient had an labor course complicated bynothing Membrane Rupture Time/Date: 5:33 PM ,05/28/2023   Delivery Method:Vaginal, Spontaneous  Episiotomy: None  Lacerations:  2nd degree;Perineal  Details of delivery can be found in separate delivery note.  Patient had a postpartum course complicated bynothing. Patient is discharged home 05/30/23.  Newborn Data: Birth date:05/28/2023  Birth time:9:26 PM  Gender:Female  Living status:Living  Apgars:8 ,9  Weight:3530 g   Magnesium Sulfate received: No BMZ received: No Rhophylac:N/A MMR:N/A T-DaP:Given prenatally Flu: N/A Transfusion:No  Physical exam  Vitals:   05/29/23 0530 05/29/23 0924 05/29/23 2015 05/30/23 0545  BP: 118/62 122/64 125/71 116/74  Pulse: 81 82 70 (!) 59  Resp: 16 16 18 18   Temp: 98.3 F (36.8 C)  98.4 F (36.9 C) 98.5 F (36.9 C)  TempSrc: Oral  Oral Oral  SpO2: 100% 100% 100%   Weight:      Height:       General: alert, cooperative, and no distress Lochia: appropriate Uterine Fundus: firm Incision: Healing well with no significant drainage DVT Evaluation: No evidence of DVT seen on physical  exam. Labs: Lab Results  Component Value Date   WBC 18.7 (H) 05/29/2023   HGB 10.9 (L) 05/29/2023   HCT 32.6 (L) 05/29/2023   MCV 91.8 05/29/2023   PLT 162 05/29/2023      Latest Ref Rng & Units 05/06/2022   10:35 AM  CMP  Glucose 70 - 99 mg/dL 89   BUN 6 - 23 mg/dL 8   Creatinine 4.09 - 8.11 mg/dL 9.14   Sodium 782 - 956 mEq/L 140   Potassium 3.5 - 5.1 mEq/L 4.0   Chloride 96 - 112 mEq/L 105   CO2 19 - 32 mEq/L 25   Calcium 8.4 - 10.5 mg/dL 9.5   Total Protein 6.0 - 8.3 g/dL 7.3   Total Bilirubin 0.2 - 1.2 mg/dL 0.6   Alkaline Phos 39 - 117 U/L 63   AST 0 - 37 U/L 17   ALT 0 - 35 U/L 15    Edinburgh Score:    05/29/2023    4:07 PM  Edinburgh Postnatal Depression Scale Screening Tool  I have been able to laugh and see the funny side of things. 0  I have looked forward with enjoyment to things. 0  I have blamed myself unnecessarily when things went wrong. 1  I have been anxious or worried for no good reason. 2  I have felt scared or panicky for no good reason. 2  Things  have been getting on top of me. 0  I have been so unhappy that I have had difficulty sleeping. 0  I have felt sad or miserable. 1  I have been so unhappy that I have been crying. 1  The thought of harming myself has occurred to me. 0  Edinburgh Postnatal Depression Scale Total 7      After visit meds:  Allergies as of 05/30/2023   No Known Allergies      Medication List     STOP taking these medications    Aimovig 140 MG/ML Soaj Generic drug: Erenumab-aooe   cyanocobalamin 1000 MCG tablet Commonly known as: VITAMIN B12   hydrOXYzine 25 MG capsule Commonly known as: VISTARIL   Mydayis 50 MG Cp24 Generic drug: Amphet-Dextroamphet 3-Bead ER   ondansetron 8 MG disintegrating tablet Commonly known as: ZOFRAN-ODT   TYLENOL PO   Vitamin D3 25 MCG (1000 UT) Caps       TAKE these medications    MULTIVITAMIN ADULTS PO Take by mouth daily.   ZYRTEC PO Take by mouth daily as  needed.         Discharge home in stable condition Infant Feeding: Breast Infant Disposition:home with mother Discharge instruction: per After Visit Summary and Postpartum booklet. Activity: Advance as tolerated. Pelvic rest for 6 weeks.  Diet: routine diet Anticipated Birth Control: Unsure Postpartum Appointment:6 weeks Additional Postpartum F/U:  not applicable Future Appointments:No future appointments. Follow up Visit:      05/30/2023 Jeani Hawking, MD

## 2023-05-30 NOTE — Lactation Note (Signed)
This note was copied from a baby's chart. Lactation Consultation Note  Patient Name: Melanie Jordan Today's Date: 05/30/2023 Age:31 hours  Per [facility] RN, lactating parent changed her feeding preference to exclusive formula.    Feeding Mother's Current Feeding Choice: Formula Nipple Type: Slow - flow  Consult Status Consult Status: Complete Date: 05/30/23 Follow-up type: Call as needed    Ada Woodbury A Higuera Ancidey 05/30/2023, 8:01 AM

## 2023-06-04 ENCOUNTER — Inpatient Hospital Stay (HOSPITAL_COMMUNITY): Payer: 59

## 2023-06-23 ENCOUNTER — Other Ambulatory Visit: Payer: Self-pay

## 2023-06-23 ENCOUNTER — Telehealth (HOSPITAL_COMMUNITY): Payer: Self-pay | Admitting: *Deleted

## 2023-06-23 DIAGNOSIS — F411 Generalized anxiety disorder: Secondary | ICD-10-CM | POA: Diagnosis not present

## 2023-06-23 NOTE — Telephone Encounter (Signed)
Attempted hospital discharge follow-up call. Left message for patient to return RN call with any questions or concerns. Deforest Hoyles, RN, 06/23/23, [phone]

## 2023-06-26 ENCOUNTER — Other Ambulatory Visit: Payer: Self-pay

## 2023-06-27 ENCOUNTER — Other Ambulatory Visit: Payer: Self-pay

## 2023-06-29 ENCOUNTER — Other Ambulatory Visit: Payer: Self-pay

## 2023-07-01 ENCOUNTER — Other Ambulatory Visit: Payer: Self-pay

## 2023-07-03 ENCOUNTER — Other Ambulatory Visit: Payer: Self-pay

## 2023-07-07 DIAGNOSIS — Z1389 Encounter for screening for other disorder: Secondary | ICD-10-CM | POA: Diagnosis not present

## 2023-07-09 ENCOUNTER — Other Ambulatory Visit: Payer: Self-pay

## 2023-07-10 ENCOUNTER — Other Ambulatory Visit: Payer: Self-pay

## 2023-07-10 ENCOUNTER — Other Ambulatory Visit (HOSPITAL_BASED_OUTPATIENT_CLINIC_OR_DEPARTMENT_OTHER): Payer: Self-pay

## 2023-07-10 ENCOUNTER — Encounter: Payer: Self-pay | Admitting: Nurse Practitioner

## 2023-07-10 ENCOUNTER — Ambulatory Visit (INDEPENDENT_AMBULATORY_CARE_PROVIDER_SITE_OTHER): Payer: 59 | Admitting: Nurse Practitioner

## 2023-07-10 VITALS — BP 118/72 | HR 76 | Temp 97.0°F | Ht 67.0 in | Wt 186.4 lb

## 2023-07-10 DIAGNOSIS — R159 Full incontinence of feces: Secondary | ICD-10-CM

## 2023-07-10 DIAGNOSIS — M545 Low back pain, unspecified: Secondary | ICD-10-CM

## 2023-07-10 DIAGNOSIS — K0889 Other specified disorders of teeth and supporting structures: Secondary | ICD-10-CM

## 2023-07-10 DIAGNOSIS — G43709 Chronic migraine without aura, not intractable, without status migrainosus: Secondary | ICD-10-CM

## 2023-07-10 DIAGNOSIS — F32A Depression, unspecified: Secondary | ICD-10-CM

## 2023-07-10 DIAGNOSIS — G8929 Other chronic pain: Secondary | ICD-10-CM | POA: Diagnosis not present

## 2023-07-10 DIAGNOSIS — E538 Deficiency of other specified B group vitamins: Secondary | ICD-10-CM

## 2023-07-10 DIAGNOSIS — E559 Vitamin D deficiency, unspecified: Secondary | ICD-10-CM

## 2023-07-10 DIAGNOSIS — F419 Anxiety disorder, unspecified: Secondary | ICD-10-CM | POA: Diagnosis not present

## 2023-07-10 LAB — COMPREHENSIVE METABOLIC PANEL
ALT: 50 U/L — ABNORMAL HIGH (ref 0–35)
AST: 37 U/L (ref 0–37)
Albumin: 4.4 g/dL (ref 3.5–5.2)
Alkaline Phosphatase: 98 U/L (ref 39–117)
BUN: 9 mg/dL (ref 6–23)
CO2: 27 mEq/L (ref 19–32)
Calcium: 9.2 mg/dL (ref 8.4–10.5)
Chloride: 104 mEq/L (ref 96–112)
Creatinine, Ser: 0.67 mg/dL (ref 0.40–1.20)
GFR: 117.63 mL/min (ref >60.00)
Glucose, Bld: 88 mg/dL (ref 70–99)
Potassium: 3.9 mEq/L (ref 3.5–5.1)
Sodium: 138 mEq/L (ref 135–145)
Total Bilirubin: 0.4 mg/dL (ref 0.2–1.2)
Total Protein: 6.8 g/dL (ref 6.0–8.3)

## 2023-07-10 LAB — VITAMIN B12: Vitamin B-12: 341 pg/mL (ref 211–911)

## 2023-07-10 LAB — VITAMIN D 25 HYDROXY (VIT D DEFICIENCY, FRACTURES): VITD: 26.14 ng/mL — ABNORMAL LOW (ref 30.00–100.00)

## 2023-07-10 MED ORDER — AMOXICILLIN-POT CLAVULANATE 875-125 MG PO TABS: 1.0000 | ORAL_TABLET | Freq: Two times a day (BID) | ORAL | 0 refills | Status: DC

## 2023-07-10 MED ORDER — AIMOVIG 140 MG/ML ~~LOC~~ SOAJ
140.0000 mg | SUBCUTANEOUS | 3 refills | Status: DC
  Filled 2023-07-10: qty 3, 84d supply, fill #0
  Filled 2023-09-23: qty 3, 84d supply, fill #1
  Filled 2023-12-09: qty 3, 84d supply, fill #2
  Filled 2024-03-03: qty 3, 84d supply, fill #3

## 2023-07-10 NOTE — Assessment & Plan Note (Signed)
Will check vitamin D and treat based on results.

## 2023-07-10 NOTE — Assessment & Plan Note (Signed)
Chronic, stable. She is seeing a therapist regularly which is helping control her symptoms. Follow-up with any concerns.

## 2023-07-10 NOTE — Assessment & Plan Note (Addendum)
Chronic, not controlled. She was taking aimovig once a month before she got pregnant which was controlling her symptoms. Will restart aimovig 140mg  every 28 days. She has tried imitrex, nortriptyline, nurtec, metoprolol, naratriptan, and emaglity in the past which didn't work or she had side effects. Follow-up in 3 months. She is not breast feeding.

## 2023-07-10 NOTE — Patient Instructions (Signed)
It was great to see you!  I have sent your aimovig to the pharmacy.   I am placing a referral to GI  We are checking your labs today and will let you know the results via mychart/phone.   Let's follow-up in 3-6 months, sooner if you have concerns.  If a referral was placed today, you will be contacted for an appointment. Please note that routine referrals can sometimes take up to 3-4 weeks to process. Please call our office if you haven't heard anything after this time frame.  Take care,  Rodman Pickle, NP

## 2023-07-10 NOTE — Progress Notes (Signed)
New Patient Visit  BP 118/72 (BP Location: Left Arm)   Pulse 76   Temp (!) 97 F (36.1 C)   Ht 5\' 7"  (1.702 m)   Wt 186 lb 6.4 oz (84.6 kg)   SpO2 99%   Breastfeeding No   BMI 29.19 kg/m    Subjective:    Patient ID: Melanie Jordan, female    DOB: 1991-08-24, 32 y.o.   MRN: [id number]  CC: Chief Complaint  Patient presents with   Establish Care    NP. Est. Care, Migraine management    HPI: Melanie Jordan is a 32 y.o. female presents to transfer care to a new provider.  Introduced to Publishing rights manager role and practice setting.  All questions answered.  Discussed provider/patient relationship and expectations.  She has a history of migraines that were well controlled before her pregnancy with aimovig once a month injection. She notes that she did not have any migraines during her pregnancy. She is 6 weeks post partum and notes that her headaches have returned almost daily. They are associated with vertigo and sometimes nausea. She has tried Sumatriptan, nortriptyline, nurtec, emgality, metoprolol, and naratriptan in the past. She was following with neurology at [facility], however has not been able to go back there since she changed insurances. She denies aura before her migraines.   She has been experiencing back spasms since giving birth near where she had an epidural. The pain goes up and down her back. She does have back pain after sleeping in bed but not continuously. She states that she also has a history of sciatica, but this is doing ok right now. She will take ibuprofen as needed for pain.   She has noticed a lump behind her ear left ear.  She states that it aches at times.  She has dentist a appointment next month but her wisdom teeth are causing pain. She denies fevers. She has taken ibuprofen as needed for pain.  She has taken Orajel, but she does not like how it makes her tongue numb and it causes her to feel nauseous.  She notes that for the last few years, she  will go to the bathroom and have a bowel movement, then the next time she goes to the bathroom again, there will be more stool there when she wipes.   She has a history of depression, anxiety, and ADHD.  She is currently following with a therapist.  She had taken nortriptyline in the past for her migraines, but it made her depression worse.  Since then she has been hesitant to take any medications for her mood.  She states that she is doing well overall with her mood since she is 6 weeks postpartum.      07/10/2023    1:08 PM 04/08/2022    8:37 AM 11/08/2020   10:00 AM 04/02/2019    2:22 PM 09/21/2018    9:10 AM  Depression screen PHQ 2/9  Decreased Interest 0 1 1 0 0  Down, Depressed, Hopeless 1 1 1  0 0  PHQ - 2 Score 1 2 2  0 0  Altered sleeping 0 1 0 3   Tired, decreased energy  2 0 1   Change in appetite 1 1 0 0   Feeling bad or failure about yourself  0 0 0 0   Trouble concentrating 3 3 3  0   Moving slowly or fidgety/restless 0 3 0 0   Suicidal thoughts 0 0 0 0  PHQ-9 Score 5 12 5 4    Difficult doing work/chores Somewhat difficult Very difficult Somewhat difficult        07/10/2023    1:08 PM 04/08/2022    8:38 AM 04/02/2019    2:25 PM  GAD 7 : Generalized Anxiety Score  Nervous, Anxious, on Edge 1 2 3   Control/stop worrying 1 1 1   Worry too much - different things 1 2 1   Trouble relaxing 0 1 1  Restless 0 1 0  Easily annoyed or irritable 1 1 1   Afraid - awful might happen 1 1 1   Total GAD 7 Score 5 9 8   Anxiety Difficulty Somewhat difficult Somewhat difficult Not difficult at all    Past Medical History:  Diagnosis Date   ADHD    now off Adderal    Allergy    Anxiety    Chronic head pain    taking Metoprolol   Depression    GERD (gastroesophageal reflux disease)    H/O endoscopy 2020   IBS (irritable bowel syndrome)    Migraine     Past Surgical History:  Procedure Laterality Date   ESOPHAGEAL MANOMETRY N/A 10/13/2019   Procedure: ESOPHAGEAL MANOMETRY (EM);   Surgeon: Napoleon Form, MD;  Location: [facility];  Service: Endoscopy;  Laterality: N/A;   TRANSTHORACIC ECHOCARDIOGRAM  03/2020   EF 60 to 65%..  Normal valves.  Normal chamber sizes.  No pericardial effusion..    Family History  Problem Relation Age of Onset   Irritable bowel syndrome Mother    Cancer Mother 80       cervical and stomach cancer   Fibroids Mother        uterine fibroid   Thyroid disease Mother        hypothyroidism due to Hashimoto   Osteopenia Mother    GER disease Mother    Drug abuse Mother    Stomach cancer Mother    Anxiety disorder Mother    Arthritis Mother    Depression Father    Anxiety disorder Sister    Asthma Brother    Anxiety disorder Brother    Irritable bowel syndrome Brother    Asthma Brother    ADD / ADHD Maternal Uncle    Arthritis Maternal Grandmother    Depression Maternal Grandmother    Heart disease Maternal Grandmother 101       CAD with stents   Hyperlipidemia Maternal Grandmother    Hypertension Maternal Grandmother    Stroke Maternal Grandmother 72       TIA   Cancer Paternal Grandmother 79       uterine cancer and gastric cancer   Colon cancer Neg Hx    Esophageal cancer Neg Hx    Rectal cancer Neg Hx      Social History   Tobacco Use   Smoking status: Never   Smokeless tobacco: Never  Vaping Use   Vaping status: Never Used  Substance Use Topics   Alcohol use: Yes    Alcohol/week: 7.0 standard drinks of alcohol    Comment: Socially   Drug use: Never    Current Outpatient Medications on File Prior to Visit  Medication Sig Dispense Refill   ALPRAZolam (XANAX) 0.5 MG tablet Take 0.5 mg by mouth at bedtime as needed.     Cetirizine HCl (ZYRTEC PO) Take by mouth daily as needed.      Cholecalciferol (D 1000) 25 MCG (1000 UT) capsule Take 1,000 Units by mouth daily.  Cyanocobalamin (VITAMIN B12) 1000 MCG TBCR 1 tablet Orally Once a day     ibuprofen (ADVIL) 200 MG tablet Take 200 mg by mouth every 4  (four) hours as needed.     Multiple Vitamins-Minerals (MULTIVITAMIN ADULTS PO) Take by mouth daily.     norethindrone (CAMILA) 0.35 MG tablet Take 1 tablet by mouth daily.     norethindrone (MICRONOR) 0.35 MG tablet Take 1 tablet by mouth daily.     No current facility-administered medications on file prior to visit.     Review of Systems  Constitutional:  Positive for fatigue. Negative for fever.  HENT:  Negative for congestion, ear pain and sore throat.        Tooth pain  Respiratory: Negative.    Cardiovascular: Negative.   Gastrointestinal: Negative.   Genitourinary: Negative.   Musculoskeletal:  Positive for back pain.  Skin: Negative.   Neurological:  Positive for dizziness (with migraines) and headaches.  Psychiatric/Behavioral: Negative.        Objective:    BP 118/72 (BP Location: Left Arm)   Pulse 76   Temp (!) 97 F (36.1 C)   Ht 5\' 7"  (1.702 m)   Wt 186 lb 6.4 oz (84.6 kg)   SpO2 99%   Breastfeeding No   BMI 29.19 kg/m   Wt Readings from Last 3 Encounters:  07/10/23 186 lb 6.4 oz (84.6 kg)  05/28/23 204 lb 9.6 oz (92.8 kg)  05/28/23 204 lb (92.5 kg)    BP Readings from Last 3 Encounters:  07/10/23 118/72  05/30/23 116/74  05/28/23 109/68    Physical Exam Vitals and nursing note reviewed.  Constitutional:      General: She is not in acute distress.    Appearance: Normal appearance.  HENT:     Head: Normocephalic and atraumatic.     Right Ear: Tympanic membrane, ear canal and external ear normal.     Left Ear: Tympanic membrane, ear canal and external ear normal.  Eyes:     Conjunctiva/sclera: Conjunctivae normal.  Cardiovascular:     Rate and Rhythm: Normal rate and regular rhythm.     Pulses: Normal pulses.     Heart sounds: Normal heart sounds.  Pulmonary:     Effort: Pulmonary effort is normal.     Breath sounds: Normal breath sounds.  Abdominal:     Palpations: Abdomen is soft.     Tenderness: There is no abdominal tenderness.   Musculoskeletal:        General: Normal range of motion.     Cervical back: Normal range of motion and neck supple. Tenderness (at TMJ joint on left side) present.     Right lower leg: No edema.     Left lower leg: No edema.  Lymphadenopathy:     Cervical: Cervical adenopathy present.  Skin:    General: Skin is warm and dry.  Neurological:     General: No focal deficit present.     Mental Status: She is alert and oriented to person, place, and time.     Cranial Nerves: No cranial nerve deficit.     Coordination: Coordination normal.     Gait: Gait normal.  Psychiatric:        Mood and Affect: Mood normal.        Behavior: Behavior normal.        Thought Content: Thought content normal.        Judgment: Judgment normal.       Assessment & Plan:  Problem List Items Addressed This Visit       Cardiovascular and Mediastinum   Chronic migraine without aura without status migrainosus, not intractable - Primary    Chronic, not controlled. She was taking aimovig once a month before she got pregnant which was controlling her symptoms. Will restart aimovig 140mg  every 28 days. She has tried imitrex, nortriptyline, nurtec, metoprolol, naratriptan, and emaglity in the past which didn't work or she had side effects. Follow-up in 3 months. She is not breast feeding.       Relevant Medications   ibuprofen (ADVIL) 200 MG tablet   Erenumab-aooe (AIMOVIG) 140 MG/ML SOAJ   Other Relevant Orders   Comprehensive metabolic panel     Other   B12 deficiency    Check B12 levels and treat based on results.       Relevant Orders   Vitamin B12   Vitamin D deficiency    Will check vitamin D and treat based on results.       Relevant Orders   VITAMIN D 25 Hydroxy (Vit-D Deficiency, Fractures)   Anxiety and depression    Chronic, stable. She is seeing a therapist regularly which is helping control her symptoms. Follow-up with any concerns.       Relevant Medications   ALPRAZolam (XANAX)  0.5 MG tablet   Chronic midline low back pain    Chronic, ongoing. She does have sciatica at times. No red flags on exam. She has been having intermittent back spasm as well. OB/GYN cleared her for physical activity. Recommend that she start doing back stretches and core exercises. She declines flexeril today. Follow-up with any concerns.       Relevant Medications   ibuprofen (ADVIL) 200 MG tablet   Other Visit Diagnoses     Incontinence of feces, unspecified fecal incontinence type       After she goes to the bathroom after a BM, she will continue to have stool on toilet paper. She was supposed to have colonscopy. will refer her to GI   Relevant Orders   Ambulatory referral to Gastroenterology   Tooth pain       She has an appointment with dentist next month. Will start augmentin BID x10 days in case of wisdom teeth causing infection.        Follow up plan: Return in about 3 months (around 10/10/2023) for 3-6 months CPE.   A

## 2023-07-10 NOTE — Assessment & Plan Note (Signed)
Chronic, ongoing. She does have sciatica at times. No red flags on exam. She has been having intermittent back spasm as well. OB/GYN cleared her for physical activity. Recommend that she start doing back stretches and core exercises. She declines flexeril today. Follow-up with any concerns.

## 2023-07-10 NOTE — Assessment & Plan Note (Signed)
Check B12 levels and treat based on results.

## 2023-07-14 ENCOUNTER — Other Ambulatory Visit (HOSPITAL_BASED_OUTPATIENT_CLINIC_OR_DEPARTMENT_OTHER): Payer: Self-pay

## 2023-07-15 ENCOUNTER — Encounter (HOSPITAL_BASED_OUTPATIENT_CLINIC_OR_DEPARTMENT_OTHER): Payer: Self-pay

## 2023-07-15 ENCOUNTER — Encounter: Payer: Self-pay | Admitting: Nurse Practitioner

## 2023-07-15 ENCOUNTER — Other Ambulatory Visit (HOSPITAL_BASED_OUTPATIENT_CLINIC_OR_DEPARTMENT_OTHER): Payer: Self-pay

## 2023-07-16 ENCOUNTER — Other Ambulatory Visit (HOSPITAL_BASED_OUTPATIENT_CLINIC_OR_DEPARTMENT_OTHER): Payer: Self-pay

## 2023-07-17 ENCOUNTER — Encounter (HOSPITAL_BASED_OUTPATIENT_CLINIC_OR_DEPARTMENT_OTHER): Payer: Self-pay

## 2023-07-17 ENCOUNTER — Other Ambulatory Visit (HOSPITAL_BASED_OUTPATIENT_CLINIC_OR_DEPARTMENT_OTHER): Payer: Self-pay

## 2023-08-08 ENCOUNTER — Ambulatory Visit (INDEPENDENT_AMBULATORY_CARE_PROVIDER_SITE_OTHER): Payer: 59 | Admitting: Nurse Practitioner

## 2023-08-08 ENCOUNTER — Encounter: Payer: Self-pay | Admitting: Nurse Practitioner

## 2023-08-08 VITALS — BP 114/76 | HR 73 | Temp 98.3°F | Ht 67.0 in | Wt 188.0 lb

## 2023-08-08 DIAGNOSIS — L089 Local infection of the skin and subcutaneous tissue, unspecified: Secondary | ICD-10-CM | POA: Diagnosis not present

## 2023-08-08 MED ORDER — CEPHALEXIN 500 MG PO CAPS: 500.0000 mg | ORAL_CAPSULE | Freq: Three times a day (TID) | ORAL | 0 refills | Status: DC

## 2023-08-08 NOTE — Progress Notes (Signed)
Acute Office Visit  Subjective:     Patient ID: Melanie Jordan, female    DOB: 1991-02-04, 32 y.o.   MRN: [id number]  Chief Complaint  Patient presents with   Ingrown Toenail    Right great toe-red and swollen    HPI Patient is in today for right great toe red and swollen. She states that she had a pedicure on Tuesday and then started having pain on Wednesday. She tried to cut the corner of her nail off in the past which had helped, but did not help this time. She also soaked it which did help some. She denies fevers. She states the pain is worse when something touches the area.   ROS See pertinent positives and negatives per HPI.     Objective:    BP 114/76 (BP Location: Right Arm)   Pulse 73   Temp 98.3 F (36.8 C) (Oral)   Ht 5\' 7"  (1.702 m)   Wt 188 lb (85.3 kg)   LMP 07/29/2023 (Exact Date)   SpO2 100%   Breastfeeding No   BMI 29.44 kg/m    Physical Exam Vitals and nursing note reviewed.  Constitutional:      General: She is not in acute distress.    Appearance: Normal appearance.  HENT:     Head: Normocephalic.  Eyes:     Conjunctiva/sclera: Conjunctivae normal.  Pulmonary:     Effort: Pulmonary effort is normal.  Musculoskeletal:     Cervical back: Normal range of motion.  Skin:    General: Skin is warm.     Comments: Right great toe red and swollen along medial border of nail  Neurological:     General: No focal deficit present.     Mental Status: She is alert and oriented to person, place, and time.  Psychiatric:        Mood and Affect: Mood normal.        Behavior: Behavior normal.        Thought Content: Thought content normal.        Judgment: Judgment normal.       Assessment & Plan:   Problem List Items Addressed This Visit   None Visit Diagnoses     Soft tissue infection    -  Primary   Did not see nail ingrowing into skin, but there is redness and swelling. Will treat with keflex TID x10 days and soak foot in epsom salt water  BID.   Relevant Medications   cephALEXin (KEFLEX) 500 MG capsule       Meds ordered this encounter  Medications   cephALEXin (KEFLEX) 500 MG capsule    Sig: Take 1 capsule (500 mg total) by mouth 3 (three) times daily.    Dispense:  30 capsule    Refill:  0    Return if symptoms worsen or fail to improve.  Gerre Scull, NP

## 2023-08-08 NOTE — Patient Instructions (Signed)
It was great to see you!  Start keflex 3 time a day for 10 days.   Soak your foot in warm epsom salt water for 10 minutes twice a day.   Let's follow-up if your symptoms worsen or don't improve.   Take care,  Rodman Pickle, NP

## 2023-08-14 NOTE — Telephone Encounter (Signed)
[facility] to schedule an OV to be assessed in person. Also sending pt a [facility] message.

## 2023-08-14 NOTE — Telephone Encounter (Signed)
Patient is scheduled for Ov 9/20

## 2023-08-15 ENCOUNTER — Ambulatory Visit (INDEPENDENT_AMBULATORY_CARE_PROVIDER_SITE_OTHER): Payer: 59 | Admitting: Internal Medicine

## 2023-08-15 ENCOUNTER — Encounter: Payer: Self-pay | Admitting: Internal Medicine

## 2023-08-15 VITALS — BP 110/70 | HR 81 | Temp 97.8°F | Ht 67.0 in | Wt 189.4 lb

## 2023-08-15 DIAGNOSIS — L089 Local infection of the skin and subcutaneous tissue, unspecified: Secondary | ICD-10-CM | POA: Diagnosis not present

## 2023-08-15 MED ORDER — SULFAMETHOXAZOLE-TRIMETHOPRIM 800-160 MG PO TABS
1.0000 | ORAL_TABLET | Freq: Two times a day (BID) | ORAL | 0 refills | Status: AC
Start: 2023-08-15 — End: 2023-08-22

## 2023-08-15 NOTE — Progress Notes (Signed)
[facility] [address] [place] [address] Dept: [phone] Dept Fax: [phone]  Acute Care Office Visit  Subjective:   Melanie Jordan [birth year] 08/15/2023  Chief Complaint  Patient presents with   Follow-up    HPI: Melanie Jordan is a 32 yo F who presents for follow up of soft tissue infection of right great toe. She states area became red and swollen after getting a pedicure last week. Patient was seen by PCP on 08/08/23 , prescribed keflex 500mg  TID.  She has been compliant with antibiotic regimen and has noticed some improvement on antibiotics, however has noticed continued redness and pocket of yellow exudate to medial aspect of right great toe. She has tried to do Epsom salt soaks daily, but has not been diligent about it due to taking care of her 43 month old child.   The following portions of the patient's history were reviewed and updated as appropriate: past medical history, past surgical history, family history, social history, allergies, medications, and problem list.   Patient Active Problem List   Diagnosis Date Noted   Vitamin D deficiency 07/10/2023   Chronic migraine without aura without status migrainosus, not intractable 07/10/2023   Anxiety and depression 07/10/2023   Chronic midline low back pain 07/10/2023   B12 deficiency 04/08/2022   Hypokalemia 04/08/2022   Hemorrhoids 06/15/2021   Abnormal menstruation 04/26/2021   Hypermobility syndrome 03/28/2020   Anterior chest wall pain 08/12/2019   Environmental allergies 08/05/2019   Vestibular migraine 03/28/2019   Past Medical History:  Diagnosis Date   ADHD    now off Adderal    Allergy    Anxiety    Chronic head pain    taking Metoprolol   Depression    GERD (gastroesophageal reflux disease)    H/O endoscopy 2020   IBS (irritable bowel syndrome)    Migraine    Past Surgical History:  Procedure Laterality Date   ESOPHAGEAL  MANOMETRY N/A 10/13/2019   Procedure: ESOPHAGEAL MANOMETRY (EM);  Surgeon: Napoleon Form, MD;  Location: [facility];  Service: Endoscopy;  Laterality: N/A;   TRANSTHORACIC ECHOCARDIOGRAM  03/2020   EF 60 to 65%..  Normal valves.  Normal chamber sizes.  No pericardial effusion..   Family History  Problem Relation Age of Onset   Irritable bowel syndrome Mother    Cancer Mother 34       cervical and stomach cancer   Fibroids Mother        uterine fibroid   Thyroid disease Mother        hypothyroidism due to Hashimoto   Osteopenia Mother    GER disease Mother    Drug abuse Mother    Stomach cancer Mother    Anxiety disorder Mother    Arthritis Mother    Depression Father    Anxiety disorder Sister    Asthma Brother    Anxiety disorder Brother    Irritable bowel syndrome Brother    Asthma Brother    ADD / ADHD Maternal Uncle    Arthritis Maternal Grandmother    Depression Maternal Grandmother    Heart disease Maternal Grandmother 75       CAD with stents   Hyperlipidemia Maternal Grandmother    Hypertension Maternal Grandmother    Stroke Maternal Grandmother 72       TIA   Cancer Paternal Grandmother 77       uterine cancer and gastric cancer   Colon cancer Neg  Hx    Esophageal cancer Neg Hx    Rectal cancer Neg Hx     Current Outpatient Medications:    ALPRAZolam (XANAX) 0.5 MG tablet, Take 0.5 mg by mouth at bedtime as needed., Disp: , Rfl:    cephALEXin (KEFLEX) 500 MG capsule, Take 1 capsule (500 mg total) by mouth 3 (three) times daily., Disp: 30 capsule, Rfl: 0   Cetirizine HCl (ZYRTEC PO), Take by mouth daily as needed. , Disp: , Rfl:    Cholecalciferol (D 1000) 25 MCG (1000 UT) capsule, Take 1,000 Units by mouth daily., Disp: , Rfl:    Cyanocobalamin (VITAMIN B12) 1000 MCG TBCR, 1 tablet Orally Once a day, Disp: , Rfl:    Erenumab-aooe (AIMOVIG) 140 MG/ML SOAJ, Inject 140 mg into the skin every 28 (twenty-eight) days., Disp: 3 mL, Rfl: 3   ibuprofen (ADVIL)  200 MG tablet, Take 200 mg by mouth every 4 (four) hours as needed., Disp: , Rfl:    Multiple Vitamins-Minerals (MULTIVITAMIN ADULTS PO), Take by mouth daily., Disp: , Rfl:    norethindrone (CAMILA) 0.35 MG tablet, Take 1 tablet by mouth daily., Disp: , Rfl:    sulfamethoxazole-trimethoprim (BACTRIM DS) 800-160 MG tablet, Take 1 tablet by mouth 2 (two) times daily for 7 days., Disp: 14 tablet, Rfl: 0 Allergies  Allergen Reactions   Cat Hair Extract Other (See Comments)     ROS: A complete ROS was performed with pertinent positives/negatives noted in the HPI. The remainder of the ROS are negative.    Objective:   Today's Vitals   08/15/23 1002  BP: 110/70  Pulse: 81  Temp: 97.8 F (36.6 C)  TempSrc: Temporal  SpO2: 97%  Weight: 189 lb 6.4 oz (85.9 kg)  Height: 5\' 7"  (1.702 m)    GENERAL: Well-appearing, in NAD. Well nourished.  SKIN: Pink, warm and dry. Redness and small amount of yellow exudate beneath superficial epidermal layer to medial aspect of right great toe. No active drainage.  No ingrown toenail or nailbed infection.  RESPIRATORY: Chest wall symmetrical. Respirations even and non-labored.  EXTREMITIES: Without clubbing, cyanosis, or edema. 2+ DP pulses. NEUROLOGIC: Steady, even gait.  PSYCH/MENTAL STATUS: Alert, oriented x 3. Cooperative, appropriate mood and affect.    No results found for any visits on 08/15/23.    Assessment & Plan:  1. Soft tissue infection - sulfamethoxazole-trimethoprim (BACTRIM DS) 800-160 MG tablet; Take 1 tablet by mouth 2 (two) times daily for 7 days.  Dispense: 14 tablet; Refill: 0 - d/c keflex  - Epsom salt soaks BID  - can use drawing salve bag balm at nighttime   Meds ordered this encounter  Medications   sulfamethoxazole-trimethoprim (BACTRIM DS) 800-160 MG tablet    Sig: Take 1 tablet by mouth 2 (two) times daily for 7 days.    Dispense:  14 tablet    Refill:  0    Order Specific Question:   Supervising Provider    Answer:    Garnette Gunner [id number]   No orders of the defined types were placed in this encounter.  Lab Orders  No laboratory test(s) ordered today   No images are attached to the encounter or orders placed in the encounter.  Return if symptoms worsen or fail to improve.   Salvatore Decent, FNP

## 2023-08-15 NOTE — Patient Instructions (Addendum)
Continue Epsom salt soaks 2 times a day.    Bag balm - apply at nighttime

## 2023-08-20 ENCOUNTER — Ambulatory Visit: Payer: 59 | Admitting: Nurse Practitioner

## 2023-08-21 ENCOUNTER — Encounter: Payer: Self-pay | Admitting: Internal Medicine

## 2023-08-21 DIAGNOSIS — L089 Local infection of the skin and subcutaneous tissue, unspecified: Secondary | ICD-10-CM

## 2023-09-03 ENCOUNTER — Encounter: Payer: Self-pay | Admitting: Podiatry

## 2023-09-03 ENCOUNTER — Ambulatory Visit (INDEPENDENT_AMBULATORY_CARE_PROVIDER_SITE_OTHER): Payer: 59 | Admitting: Podiatry

## 2023-09-03 VITALS — BP 125/73 | HR 63

## 2023-09-03 DIAGNOSIS — L600 Ingrowing nail: Secondary | ICD-10-CM

## 2023-09-03 NOTE — Progress Notes (Signed)
Subjective:  Patient ID: Melanie Jordan, female    DOB: 1991/01/25,  MRN: [id number]  Chief Complaint  Patient presents with   Foot Pain    Patient is having pain in hallux . Showed up one month ago after getting feet done    32 y.o. female presents with the above complaint.  Patient presents with left hallux lateral border ingrown painful to touch is progressive gotten worse worse with ambulation worse with pressure she has not seen MRIs prior to seeing me denies any other acute complaints pain scale 7 out of 10 dull aching nature.   Review of Systems: Negative except as noted in the HPI. Denies N/V/F/Ch.  Past Medical History:  Diagnosis Date   ADHD    now off Adderal    Allergy    Anxiety    Chronic head pain    taking Metoprolol   Depression    GERD (gastroesophageal reflux disease)    H/O endoscopy 2020   IBS (irritable bowel syndrome)    Migraine     Current Outpatient Medications:    ALPRAZolam (XANAX) 0.5 MG tablet, Take 0.5 mg by mouth at bedtime as needed., Disp: , Rfl:    Cetirizine HCl (ZYRTEC PO), Take by mouth daily as needed. , Disp: , Rfl:    Cholecalciferol (D 1000) 25 MCG (1000 UT) capsule, Take 1,000 Units by mouth daily., Disp: , Rfl:    Cyanocobalamin (VITAMIN B12) 1000 MCG TBCR, 1 tablet Orally Once a day, Disp: , Rfl:    Erenumab-aooe (AIMOVIG) 140 MG/ML SOAJ, Inject 140 mg into the skin every 28 (twenty-eight) days., Disp: 3 mL, Rfl: 3   ibuprofen (ADVIL) 200 MG tablet, Take 200 mg by mouth every 4 (four) hours as needed., Disp: , Rfl:    Multiple Vitamins-Minerals (MULTIVITAMIN ADULTS PO), Take by mouth daily., Disp: , Rfl:    norethindrone (CAMILA) 0.35 MG tablet, Take 1 tablet by mouth daily., Disp: , Rfl:   Social History   Tobacco Use  Smoking Status Never  Smokeless Tobacco Never    Allergies  Allergen Reactions   Cat Hair Extract Other (See Comments)   Objective:   Vitals:   09/03/23 1337  BP: 125/73  Pulse: 63   There is no  height or weight on file to calculate BMI. Constitutional Well developed. Well nourished.  Vascular Dorsalis pedis pulses palpable bilaterally. Posterior tibial pulses palpable bilaterally. Capillary refill normal to all digits.  No cyanosis or clubbing noted. Pedal hair growth normal.  Neurologic Normal speech. Oriented to person, place, and time. Epicritic sensation to light touch grossly present bilaterally.  Dermatologic Painful ingrowing nail at lateral nail borders of the hallux nail left. No other open wounds. No skin lesions.  Orthopedic: Normal joint ROM without pain or crepitus bilaterally. No visible deformities. No bony tenderness.   Radiographs: None Assessment:   1. Ingrown left big toenail    Plan:  Patient was evaluated and treated and all questions answered.  Ingrown Nail, left -Patient elects to proceed with minor surgery to remove ingrown toenail removal today. Consent reviewed and signed by patient. -Ingrown nail excised. See procedure note. -Educated on post-procedure care including soaking. Written instructions provided and reviewed. -Patient to follow up in 2 weeks for nail check.  Procedure: Excision of Ingrown Toenail Location: Left 1st toe lateral nail borders. Anesthesia: Lidocaine 1% plain; 1.5 mL and Marcaine 0.5% plain; 1.5 mL, digital block. Skin Prep: Betadine. Dressing: Silvadene; telfa; dry, sterile, compression dressing. Technique: Following skin  prep, the toe was exsanguinated and a tourniquet was secured at the base of the toe. The affected nail border was freed, split with a nail splitter, and excised. Chemical matrixectomy was then performed with phenol and irrigated out with alcohol. The tourniquet was then removed and sterile dressing applied. Disposition: Patient tolerated procedure well. Patient to return in 2 weeks for follow-up.   No follow-ups on file.

## 2023-09-29 ENCOUNTER — Other Ambulatory Visit: Payer: Self-pay | Admitting: Nurse Practitioner

## 2023-09-29 NOTE — Telephone Encounter (Signed)
Last Ov 08/08/23 Filled by historical provider Is it ok to refill

## 2023-09-30 ENCOUNTER — Other Ambulatory Visit: Payer: Self-pay

## 2023-09-30 MED ORDER — NORETHINDRONE 0.35 MG PO TABS
1.0000 | ORAL_TABLET | Freq: Every day | ORAL | 3 refills | Status: DC
  Filled 2023-09-30 – 2023-12-15 (×3): qty 84, 84d supply, fill #0
  Filled 2024-03-03: qty 84, 84d supply, fill #1
  Filled 2024-05-25: qty 84, 84d supply, fill #2
  Filled 2024-08-09: qty 84, 84d supply, fill #3

## 2023-10-08 ENCOUNTER — Encounter: Payer: Self-pay | Admitting: Psychiatry

## 2023-11-28 ENCOUNTER — Encounter: Payer: Self-pay | Admitting: Nurse Practitioner

## 2023-12-09 ENCOUNTER — Other Ambulatory Visit (HOSPITAL_BASED_OUTPATIENT_CLINIC_OR_DEPARTMENT_OTHER): Payer: Self-pay

## 2023-12-15 ENCOUNTER — Other Ambulatory Visit (HOSPITAL_BASED_OUTPATIENT_CLINIC_OR_DEPARTMENT_OTHER): Payer: Self-pay

## 2024-01-05 ENCOUNTER — Encounter: Payer: Self-pay | Admitting: Podiatry

## 2024-01-14 ENCOUNTER — Encounter: Payer: 59 | Admitting: Nurse Practitioner

## 2024-01-14 DIAGNOSIS — Z01419 Encounter for gynecological examination (general) (routine) without abnormal findings: Secondary | ICD-10-CM | POA: Diagnosis not present

## 2024-01-14 DIAGNOSIS — Z6830 Body mass index (BMI) 30.0-30.9, adult: Secondary | ICD-10-CM | POA: Diagnosis not present

## 2024-01-14 DIAGNOSIS — N952 Postmenopausal atrophic vaginitis: Secondary | ICD-10-CM | POA: Diagnosis not present

## 2024-01-21 DIAGNOSIS — H52223 Regular astigmatism, bilateral: Secondary | ICD-10-CM | POA: Diagnosis not present

## 2024-01-28 ENCOUNTER — Ambulatory Visit: Payer: 59 | Admitting: Nurse Practitioner

## 2024-01-28 ENCOUNTER — Encounter: Payer: Self-pay | Admitting: Nurse Practitioner

## 2024-01-28 ENCOUNTER — Other Ambulatory Visit (HOSPITAL_BASED_OUTPATIENT_CLINIC_OR_DEPARTMENT_OTHER): Payer: Self-pay

## 2024-01-28 VITALS — BP 122/70 | HR 90 | Temp 97.4°F | Ht 67.0 in | Wt 190.0 lb

## 2024-01-28 DIAGNOSIS — F909 Attention-deficit hyperactivity disorder, unspecified type: Secondary | ICD-10-CM

## 2024-01-28 DIAGNOSIS — Z0000 Encounter for general adult medical examination without abnormal findings: Secondary | ICD-10-CM | POA: Diagnosis not present

## 2024-01-28 DIAGNOSIS — Z79899 Other long term (current) drug therapy: Secondary | ICD-10-CM

## 2024-01-28 DIAGNOSIS — F32A Depression, unspecified: Secondary | ICD-10-CM

## 2024-01-28 DIAGNOSIS — E538 Deficiency of other specified B group vitamins: Secondary | ICD-10-CM | POA: Diagnosis not present

## 2024-01-28 DIAGNOSIS — R1012 Left upper quadrant pain: Secondary | ICD-10-CM

## 2024-01-28 DIAGNOSIS — E559 Vitamin D deficiency, unspecified: Secondary | ICD-10-CM | POA: Diagnosis not present

## 2024-01-28 DIAGNOSIS — F419 Anxiety disorder, unspecified: Secondary | ICD-10-CM

## 2024-01-28 DIAGNOSIS — G43709 Chronic migraine without aura, not intractable, without status migrainosus: Secondary | ICD-10-CM

## 2024-01-28 LAB — COMPREHENSIVE METABOLIC PANEL
ALT: 19 U/L (ref 0–35)
AST: 20 U/L (ref 0–37)
Albumin: 4.7 g/dL (ref 3.5–5.2)
Alkaline Phosphatase: 72 U/L (ref 39–117)
BUN: 11 mg/dL (ref 6–23)
CO2: 28 meq/L (ref 19–32)
Calcium: 9.4 mg/dL (ref 8.4–10.5)
Chloride: 105 meq/L (ref 96–112)
Creatinine, Ser: 0.62 mg/dL (ref 0.40–1.20)
GFR: 119.38 mL/min (ref >60.00)
Glucose, Bld: 82 mg/dL (ref 70–99)
Potassium: 3.7 meq/L (ref 3.5–5.1)
Sodium: 139 meq/L (ref 135–145)
Total Bilirubin: 0.4 mg/dL (ref 0.2–1.2)
Total Protein: 7 g/dL (ref 6.0–8.3)

## 2024-01-28 LAB — CBC WITH DIFFERENTIAL/PLATELET
Basophils Absolute: 0 10*3/uL (ref 0.0–0.1)
Basophils Relative: 0.3 % (ref 0.0–3.0)
Eosinophils Absolute: 0.1 10*3/uL (ref 0.0–0.7)
Eosinophils Relative: 0.8 % (ref 0.0–5.0)
HCT: 40.7 % (ref 36.0–46.0)
Hemoglobin: 13.9 g/dL (ref 12.0–15.0)
Lymphocytes Relative: 28 % (ref 12.0–46.0)
Lymphs Abs: 2.5 10*3/uL (ref 0.7–4.0)
MCHC: 34.2 g/dL (ref 30.0–36.0)
MCV: 92.9 fl (ref 78.0–100.0)
Monocytes Absolute: 0.6 10*3/uL (ref 0.1–1.0)
Monocytes Relative: 6.3 % (ref 3.0–12.0)
Neutro Abs: 5.7 10*3/uL (ref 1.4–7.7)
Neutrophils Relative %: 64.6 % (ref 43.0–77.0)
Platelets: 278 10*3/uL (ref 150.0–400.0)
RBC: 4.38 Mil/uL (ref 3.87–5.11)
RDW: 12.5 % (ref 11.5–15.5)
WBC: 8.8 10*3/uL (ref 4.0–10.5)

## 2024-01-28 LAB — VITAMIN B12: Vitamin B-12: 214 pg/mL (ref 211–911)

## 2024-01-28 LAB — VITAMIN D 25 HYDROXY (VIT D DEFICIENCY, FRACTURES): VITD: 31.5 ng/mL (ref 30.00–100.00)

## 2024-01-28 MED ORDER — ALPRAZOLAM 0.5 MG PO TABS
0.5000 mg | ORAL_TABLET | Freq: Every evening | ORAL | 1 refills | Status: DC | PRN
Start: 2024-01-28 — End: 2024-06-21
  Filled 2024-01-28: qty 30, 30d supply, fill #0
  Filled 2024-05-12: qty 30, 30d supply, fill #1

## 2024-01-28 MED ORDER — LISDEXAMFETAMINE DIMESYLATE 20 MG PO CAPS
20.0000 mg | ORAL_CAPSULE | Freq: Every day | ORAL | 0 refills | Status: DC
  Filled 2024-01-28: qty 30, 30d supply, fill #0

## 2024-01-28 NOTE — Assessment & Plan Note (Signed)
Health maintenance reviewed and updated. Discussed nutrition, exercise. Check CMP, CBC today. Follow-up 1 year.   

## 2024-01-28 NOTE — Assessment & Plan Note (Signed)
 Controlled substance agreement signed for vyvanse and xanax. UDS today.

## 2024-01-28 NOTE — Assessment & Plan Note (Signed)
 She experiences inattentiveness and difficulty focusing, with previous medications causing side effects. Preferring non-daily dosing due to medication anxiety, she will start Vyvanse 20mg  daily as needed. Reduce caffeine intake to minimize potential palpitations. Sign a controlled substance agreement for Vyvanse and Xanax. Perform a urine drug screening. Follow up in three months, with an option for virtual consultation.

## 2024-01-28 NOTE — Assessment & Plan Note (Signed)
 Chronic, stable. She is currently on Aimovig but has experienced some recent headaches, possibly related to lifestyle changes.

## 2024-01-28 NOTE — Assessment & Plan Note (Signed)
Will check vitamin D and treat based on results.

## 2024-01-28 NOTE — Assessment & Plan Note (Signed)
Check B12 levels and treat based on results.

## 2024-01-28 NOTE — Patient Instructions (Signed)
 It was great to see you!  We are checking your labs today and will let you know the results via mychart/phone.   Make sure you are drinking plenty of water.  Decrease caffeine when starting vyvanse - we may need to adjust the dose of this medication.   Let's follow-up in 3 months, sooner with concerns  Take care,  Rodman Pickle, NP

## 2024-01-28 NOTE — Progress Notes (Signed)
 BP 122/70 (BP Location: Right Arm, Patient Position: Sitting, Cuff Size: Normal)   Pulse 90   Temp (!) 97.4 F (36.3 C)   Ht 5\' 7"  (1.702 m)   Wt 190 lb (86.2 kg)   LMP 01/15/2024 (Exact Date)   SpO2 98%   Breastfeeding No   BMI 29.76 kg/m    Subjective:    Patient ID: Melanie Jordan, female    DOB: December 11, 1990, 32 y.o.   MRN: [id number]  CC: Chief Complaint  Patient presents with   Annual Exam    With lab work-patient is not fasting, discuss ADHD medication, Rx refill, sharp pains under left ribs    HPI: Melanie Jordan is a 33 y.o. female presenting on 01/28/2024 for comprehensive medical examination. Current medical complaints include: ADHD, anxiety and depression  Discussed the use of AI scribe software for clinical note transcription with the patient, who gave verbal consent to proceed.  History of Present Illness   The patient, with a history of ADHD, presents for a physical and to discuss ADHD medication management. She was previously managed by [facility] and was on Mydayis, which was discontinued due to palpitations. She has also tried Adderall which also caused side effects. She has not tried Theatre manager or Vyvanse. The patient reports being inattentive and easily distracted, especially when people are talking to her. She denies hyperactivity unless she is up and moving around.  In addition to ADHD, the patient has a history of migraines, managed with Aimovig. She reports occasional headaches, which she attributes to recently starting to exercise again and trying to balance her food intake. She also consumes a lot of caffeine.  The patient also reports sharp, intermittent pain under her left ribs, which started a couple of weeks ago. The pain is not associated with eating and does not move. She denies constipation, diarrhea, chest pain, and shortness of breath.  The patient also takes Xanax for anxiety, which she uses occasionally after work when she  is stressed. She denies any mood issues and reports that her mood has been okay. She has a bottle of Xanax from 2022 that she is just now running out of.     Depression and Anxiety Screen done today and results listed below:     01/28/2024    1:16 PM 07/10/2023    1:08 PM 04/08/2022    8:37 AM 11/08/2020   10:00 AM 04/02/2019    2:22 PM  Depression screen PHQ 2/9  Decreased Interest 0 0 1 1 0  Down, Depressed, Hopeless 1 1 1 1  0  PHQ - 2 Score 1 1 2 2  0  Altered sleeping 2 0 1 0 3  Tired, decreased energy 1  2 0 1  Change in appetite 0 1 1 0 0  Feeling bad or failure about yourself  0 0 0 0 0  Trouble concentrating 3 3 3 3  0  Moving slowly or fidgety/restless 1 0 3 0 0  Suicidal thoughts 0 0 0 0 0  PHQ-9 Score 8 5 12 5 4   Difficult doing work/chores Somewhat difficult Somewhat difficult Very difficult Somewhat difficult       01/28/2024    1:17 PM 07/10/2023    1:08 PM 04/08/2022    8:38 AM 04/02/2019    2:25 PM  GAD 7 : Generalized Anxiety Score  Nervous, Anxious, on Edge 2 1 2 3   Control/stop worrying 1 1 1 1   Worry too much - different things 1  1 2 1   Trouble relaxing 0 0 1 1  Restless 0 0 1 0  Easily annoyed or irritable 1 1 1 1   Afraid - awful might happen 1 1 1 1   Total GAD 7 Score 6 5 9 8   Anxiety Difficulty Somewhat difficult Somewhat difficult Somewhat difficult Not difficult at all    The patient does not have a history of falls. I did not complete a risk assessment for falls. A plan of care for falls was not documented.   Past Medical History:  Past Medical History:  Diagnosis Date   ADHD    now off Adderal    Allergy    Anxiety    Chronic head pain    taking Metoprolol   Depression    GERD (gastroesophageal reflux disease)    H/O endoscopy 2020   IBS (irritable bowel syndrome)    Migraine     Surgical History:  Past Surgical History:  Procedure Laterality Date   ESOPHAGEAL MANOMETRY N/A 10/13/2019   Procedure: ESOPHAGEAL MANOMETRY (EM);  Surgeon:  Napoleon Form, MD;  Location: [facility];  Service: Endoscopy;  Laterality: N/A;   TRANSTHORACIC ECHOCARDIOGRAM  03/2020   EF 60 to 65%..  Normal valves.  Normal chamber sizes.  No pericardial effusion..    Medications:  Current Outpatient Medications on File Prior to Visit  Medication Sig   Cetirizine HCl (ZYRTEC PO) Take by mouth daily as needed.    Erenumab-aooe (AIMOVIG) 140 MG/ML SOAJ Inject 140 mg into the skin every 28 (twenty-eight) days.   Multiple Vitamins-Minerals (MULTIVITAMIN ADULTS PO) Take by mouth daily.   norethindrone (CAMILA) 0.35 MG tablet Take 1 tablet (0.35 mg total) by mouth daily.   SODIUM FLUORIDE 5000 PPM 1.1 % PSTE at bedtime.   Cholecalciferol (D 1000) 25 MCG (1000 UT) capsule Take 1,000 Units by mouth daily. (Patient not taking: Reported on 01/28/2024)   Cyanocobalamin (VITAMIN B12) 1000 MCG TBCR 1 tablet Orally Once a day (Patient not taking: Reported on 01/28/2024)   ibuprofen (ADVIL) 200 MG tablet Take 200 mg by mouth every 4 (four) hours as needed. (Patient not taking: Reported on 01/28/2024)   No current facility-administered medications on file prior to visit.    Allergies:  Allergies  Allergen Reactions   Cat Dander Other (See Comments)    Social History:  Social History   Socioeconomic History   Marital status: Single    Spouse name: Not on file   Number of children: 0   Years of education: Not on file   Highest education level: Associate degree: occupational, Scientist, product/process development, or vocational program  Occupational History   Occupation: Public librarian: [employer]    Comment: [employer]  Tobacco Use   Smoking status: Never   Smokeless tobacco: Never  Vaping Use   Vaping status: Never Used  Substance and Sexual Activity   Alcohol use: Yes    Alcohol/week: 7.0 standard drinks of alcohol    Comment: Socially   Drug use: Never   Sexual activity: Yes    Birth control/protection: Pill  Other Topics Concern   Not on file  Social  History Narrative   Health and safety inspector   Lives with fiance   From [place]   Social Drivers of Health   Financial Resource Strain: Low Risk  (01/25/2024)   Overall Financial Resource Strain (CARDIA)    Difficulty of Paying Living Expenses: Not hard at all  Food Insecurity: No Food Insecurity (01/25/2024)  Hunger Vital Sign    Worried About Running Out of Food in the Last Year: Never true    Ran Out of Food in the Last Year: Never true  Transportation Needs: No Transportation Needs (01/25/2024)   PRAPARE - Administrator, Civil Service (Medical): No    Lack of Transportation (Non-Medical): No  Physical Activity: Insufficiently Active (01/25/2024)   Exercise Vital Sign    Days of Exercise per Week: 3 days    Minutes of Exercise per Session: 30 min  Stress: No Stress Concern Present (01/25/2024)   [employer] of Occupational Health - Occupational Stress Questionnaire    Feeling of Stress : Only a little  Social Connections: Moderately Integrated (01/25/2024)   Social Connection and Isolation Panel [NHANES]    Frequency of Communication with Friends and Family: Twice a week    Frequency of Social Gatherings with Friends and Family: Once a week    Attends Religious Services: Never    Database administrator or Organizations: No    Attends Engineer, structural: More than 4 times per year    Marital Status: Living with partner  Intimate Partner Violence: Patient Unable To Answer (05/28/2023)   Humiliation, Afraid, Rape, and Kick questionnaire    Fear of Current or Ex-Partner: Patient unable to answer    Emotionally Abused: Patient unable to answer    Physically Abused: Patient unable to answer    Sexually Abused: Patient unable to answer   Social History   Tobacco Use  Smoking Status Never  Smokeless Tobacco Never   Social History   Substance and Sexual Activity  Alcohol Use Yes   Alcohol/week: 7.0 standard drinks of alcohol   Comment: Socially    Family  History:  Family History  Problem Relation Age of Onset   Irritable bowel syndrome Mother    Cancer Mother 30       cervical and stomach cancer   Fibroids Mother        uterine fibroid   Thyroid disease Mother        hypothyroidism due to Hashimoto   Osteopenia Mother    GER disease Mother    Drug abuse Mother    Stomach cancer Mother    Anxiety disorder Mother    Arthritis Mother    Depression Father    Anxiety disorder Sister    Asthma Brother    Anxiety disorder Brother    Irritable bowel syndrome Brother    Asthma Brother    ADD / ADHD Maternal Uncle    Arthritis Maternal Grandmother    Depression Maternal Grandmother    Heart disease Maternal Grandmother 42       CAD with stents   Hyperlipidemia Maternal Grandmother    Hypertension Maternal Grandmother    Stroke Maternal Grandmother 72       TIA   Cancer Paternal Grandmother 21       uterine cancer and gastric cancer   Colon cancer Neg Hx    Esophageal cancer Neg Hx    Rectal cancer Neg Hx     Past medical history, surgical history, medications, allergies, family history and social history reviewed with patient today and changes made to appropriate areas of the chart.   Review of Systems  Constitutional: Negative.   HENT: Negative.    Eyes: Negative.   Respiratory: Negative.    Cardiovascular: Negative.   Gastrointestinal: Negative.   Genitourinary: Negative.   Musculoskeletal: Negative.   Skin: Negative.  Neurological:  Positive for headaches. Negative for dizziness.  Psychiatric/Behavioral: Negative.     All other ROS negative except what is listed above and in the HPI.      Objective:    BP 122/70 (BP Location: Right Arm, Patient Position: Sitting, Cuff Size: Normal)   Pulse 90   Temp (!) 97.4 F (36.3 C)   Ht 5\' 7"  (1.702 m)   Wt 190 lb (86.2 kg)   LMP 01/15/2024 (Exact Date)   SpO2 98%   Breastfeeding No   BMI 29.76 kg/m   Wt Readings from Last 3 Encounters:  01/28/24 190 lb (86.2 kg)   08/15/23 189 lb 6.4 oz (85.9 kg)  08/08/23 188 lb (85.3 kg)    Physical Exam Vitals and nursing note reviewed.  Constitutional:      General: She is not in acute distress.    Appearance: Normal appearance.  HENT:     Head: Normocephalic and atraumatic.     Right Ear: Tympanic membrane, ear canal and external ear normal.     Left Ear: Tympanic membrane, ear canal and external ear normal.  Eyes:     Conjunctiva/sclera: Conjunctivae normal.  Cardiovascular:     Rate and Rhythm: Normal rate and regular rhythm.     Pulses: Normal pulses.     Heart sounds: Normal heart sounds.  Pulmonary:     Effort: Pulmonary effort is normal.     Breath sounds: Normal breath sounds.  Abdominal:     Palpations: Abdomen is soft.     Tenderness: There is no abdominal tenderness.  Musculoskeletal:        General: Normal range of motion.     Cervical back: Normal range of motion and neck supple.     Right lower leg: No edema.     Left lower leg: No edema.  Lymphadenopathy:     Cervical: No cervical adenopathy.  Skin:    General: Skin is warm and dry.  Neurological:     General: No focal deficit present.     Mental Status: She is alert and oriented to person, place, and time.     Cranial Nerves: No cranial nerve deficit.     Coordination: Coordination normal.     Gait: Gait normal.  Psychiatric:        Mood and Affect: Mood normal.        Behavior: Behavior normal.        Thought Content: Thought content normal.        Judgment: Judgment normal.     Results for orders placed or performed in visit on 01/28/24  Comprehensive metabolic panel   Collection Time: 01/28/24  1:37 PM  Result Value Ref Range   Sodium 139 135 - 145 mEq/L   Potassium 3.7 3.5 - 5.1 mEq/L   Chloride 105 96 - 112 mEq/L   CO2 28 19 - 32 mEq/L   Glucose, Bld 82 70 - 99 mg/dL   BUN 11 6 - 23 mg/dL   Creatinine, Ser 1.19 0.40 - 1.20 mg/dL   Total Bilirubin 0.4 0.2 - 1.2 mg/dL   Alkaline Phosphatase 72 39 - 117 U/L    AST 20 0 - 37 U/L   ALT 19 0 - 35 U/L   Total Protein 7.0 6.0 - 8.3 g/dL   Albumin 4.7 3.5 - 5.2 g/dL   GFR 147.82 >95.62 mL/min   Calcium 9.4 8.4 - 10.5 mg/dL  CBC with Differential/Platelet   Collection Time: 01/28/24  1:37 PM  Result Value Ref Range   WBC 8.8 4.0 - 10.5 K/uL   RBC 4.38 3.87 - 5.11 Mil/uL   Hemoglobin 13.9 12.0 - 15.0 g/dL   HCT 16.1 09.6 - 04.5 %   MCV 92.9 78.0 - 100.0 fl   MCHC 34.2 30.0 - 36.0 g/dL   RDW 40.9 81.1 - 91.4 %   Platelets 278.0 150.0 - 400.0 K/uL   Neutrophils Relative % 64.6 43.0 - 77.0 %   Lymphocytes Relative 28.0 12.0 - 46.0 %   Monocytes Relative 6.3 3.0 - 12.0 %   Eosinophils Relative 0.8 0.0 - 5.0 %   Basophils Relative 0.3 0.0 - 3.0 %   Neutro Abs 5.7 1.4 - 7.7 K/uL   Lymphs Abs 2.5 0.7 - 4.0 K/uL   Monocytes Absolute 0.6 0.1 - 1.0 K/uL   Eosinophils Absolute 0.1 0.0 - 0.7 K/uL   Basophils Absolute 0.0 0.0 - 0.1 K/uL  VITAMIN D 25 Hydroxy (Vit-D Deficiency, Fractures)   Collection Time: 01/28/24  1:37 PM  Result Value Ref Range   VITD 31.50 30.00 - 100.00 ng/mL  Vitamin B12   Collection Time: 01/28/24  1:37 PM  Result Value Ref Range   Vitamin B-12 214 211 - 911 pg/mL      Assessment & Plan:   Problem List Items Addressed This Visit       Cardiovascular and Mediastinum   Chronic migraine without aura without status migrainosus, not intractable   Chronic, stable. She is currently on Aimovig but has experienced some recent headaches, possibly related to lifestyle changes.        Other   B12 deficiency   Check B12 levels and treat based on results.       Relevant Orders   Vitamin B12 (Completed)   Vitamin D deficiency   Will check vitamin D and treat based on results.       Relevant Orders   VITAMIN D 25 Hydroxy (Vit-D Deficiency, Fractures) (Completed)   Anxiety and depression   Chronic, stable. She is seeing a therapist regularly which is helping control her symptoms. Follow-up with any concerns. Will refill  xanax 0.5mg  daily as needed. She has only used 30 tablets in the last 2 years.       Relevant Medications   ALPRAZolam (XANAX) 0.5 MG tablet   Other Relevant Orders   ToxAssure Flex 23, Ur   Attention deficit hyperactivity disorder (ADHD)   She experiences inattentiveness and difficulty focusing, with previous medications causing side effects. Preferring non-daily dosing due to medication anxiety, she will start Vyvanse 20mg  daily as needed. Reduce caffeine intake to minimize potential palpitations. Sign a controlled substance agreement for Vyvanse and Xanax. Perform a urine drug screening. Follow up in three months, with an option for virtual consultation.      Relevant Orders   ToxAssure Flex 23, Ur   Routine general medical examination at a health care facility - Primary   Health maintenance reviewed and updated. Discussed nutrition, exercise. Check CMP, CBC today. Follow-up 1 year.        Relevant Medications   ALPRAZolam (XANAX) 0.5 MG tablet   Other Relevant Orders   Comprehensive metabolic panel (Completed)   CBC with Differential/Platelet (Completed)   Controlled substance agreement signed   Controlled substance agreement signed for vyvanse and xanax. UDS today.       Relevant Orders   ToxAssure Flex 23, Ur   Other Visit Diagnoses       LUQ pain  She reports sharp, intermittent pain under the left ribs. Check CMP, CBC, and advise increasing water intake to prevent dehydration.        Follow up plan: Return in about 3 months (around 04/29/2024) for adhd, may be virtual.   LABORATORY TESTING:  - Pap smear: up to date  IMMUNIZATIONS:   - Tdap: Tetanus vaccination status reviewed: last tetanus booster within 10 years. - Influenza: Declined - Pneumovax: Not applicable - Prevnar: Not applicable - HPV: Up to date - Shingrix vaccine: Not applicable  SCREENING: -Mammogram: Not applicable  - Colonoscopy: Not applicable  - Bone Density: Not applicable    PATIENT COUNSELING:   Advised to take 1 mg of folate supplement per day if capable of pregnancy.   Sexuality: Discussed sexually transmitted diseases, partner selection, use of condoms, avoidance of unintended pregnancy  and contraceptive alternatives.   Advised to avoid cigarette smoking.  I discussed with the patient that most people either abstain from alcohol or drink within safe limits (<=14/week and <=4 drinks/occasion for males, <=7/weeks and <= 3 drinks/occasion for females) and that the risk for alcohol disorders and other health effects rises proportionally with the number of drinks per week and how often a drinker exceeds daily limits.  Discussed cessation/primary prevention of drug use and availability of treatment for abuse.   Diet: Encouraged to adjust caloric intake to maintain  or achieve ideal body weight, to reduce intake of dietary saturated fat and total fat, to limit sodium intake by avoiding high sodium foods and not adding table salt, and to maintain adequate dietary potassium and calcium preferably from fresh fruits, vegetables, and low-fat dairy products.    stressed the importance of regular exercise  Injury prevention: Discussed safety belts, safety helmets, smoke detector, smoking near bedding or upholstery.   Dental health: Discussed importance of regular tooth brushing, flossing, and dental visits.    NEXT PREVENTATIVE PHYSICAL DUE IN 1 YEAR. Return in about 3 months (around 04/29/2024) for adhd, may be virtual.  Burdell Peed A Cindee Mclester

## 2024-01-28 NOTE — Assessment & Plan Note (Signed)
 Chronic, stable. She is seeing a therapist regularly which is helping control her symptoms. Follow-up with any concerns. Will refill xanax 0.5mg  daily as needed. She has only used 30 tablets in the last 2 years.

## 2024-01-29 ENCOUNTER — Encounter: Payer: Self-pay | Admitting: Nurse Practitioner

## 2024-01-29 DIAGNOSIS — D229 Melanocytic nevi, unspecified: Secondary | ICD-10-CM

## 2024-01-31 LAB — TOXASSURE FLEX 23, UR
6-ACETYLMORPHINE IA: NEGATIVE ng/mL
7-aminoclonazepam: NOT DETECTED ng/mg{creat}
AMPHETAMINES IA: NEGATIVE ng/mL
Acetaminophen Screen: NEGATIVE ug/mL
Alpha-hydroxyalprazolam: NOT DETECTED ng/mg{creat}
Alpha-hydroxymidazolam: NOT DETECTED ng/mg{creat}
Alpha-hydroxytriazolam: NOT DETECTED ng/mg{creat}
Alprazolam: NOT DETECTED ng/mg{creat}
Amino Chloropyridine: NOT DETECTED
BARBITURATES IA: NEGATIVE ng/mL
BUPRENORPHINE: NEGATIVE
Benzodiazepines: NEGATIVE
Buprenorphine: NOT DETECTED ng/mg{creat}
CANNABINOIDS IA: NEGATIVE ng/mL
CARISOPRODOL IA: NEGATIVE ng/mL
COCAINE METABOLITE IA: NEGATIVE ng/mL
Clonazepam: NOT DETECTED ng/mg{creat}
Creatinine: 44 mg/dL
Desalkylflurazepam: NOT DETECTED ng/mg{creat}
Desmethyldiazepam: NOT DETECTED ng/mg{creat}
Desmethylflunitrazepam: NOT DETECTED ng/mg{creat}
Dextromethorphan: NOT DETECTED
Dextrorphan/Levorphanol: NOT DETECTED
Diazepam: NOT DETECTED ng/mg{creat}
ETHANOL BIOMARKERS IA: NEGATIVE ng/mL
ETHYL ALCOHOL Enzymatic: NEGATIVE g/dL
FENTANYL: NEGATIVE
Fentanyl: NOT DETECTED ng/mg{creat}
Flunitrazepam: NOT DETECTED ng/mg{creat}
GABAPENTIN, IA: NEGATIVE ug/mL
Ketamine: NOT DETECTED
Lorazepam: NOT DETECTED ng/mg{creat}
MEPERIDINE IA: NEGATIVE ng/mL
METHADONE IA: NEGATIVE ng/mL
METHADONE MTB IA: NEGATIVE ng/mL
MISCELLANEOUS: NEGATIVE
Midazolam: NOT DETECTED ng/mg{creat}
Norbuprenorphine: NOT DETECTED ng/mg{creat}
Norfentanyl: NOT DETECTED ng/mg{creat}
Norketamine: NOT DETECTED
OPIATE CLASS IA: NEGATIVE ng/mL
OTHER HALLUCINOGENS: NEGATIVE
OXYCODONE CLASS IA: NEGATIVE ng/mL
Oxazepam: NOT DETECTED ng/mg{creat}
PHENCYCLIDINE IA: NEGATIVE ng/mL
PROPOXYPHENE IA: NEGATIVE ng/mL
SEDATIVE/HYPNOTICS: NEGATIVE
TAPENTADOL, IA: NEGATIVE ng/mL
TRAMADOL IA: NEGATIVE ng/mL
Temazepam: NOT DETECTED ng/mg{creat}
Zaleplon: NOT DETECTED
Zolpidem Acid: NOT DETECTED
Zolpidem: NOT DETECTED
Zopiclone/Eszopiclone: NOT DETECTED

## 2024-02-04 ENCOUNTER — Ambulatory Visit: Payer: Commercial Managed Care - PPO | Admitting: Nurse Practitioner

## 2024-02-04 ENCOUNTER — Encounter: Payer: Self-pay | Admitting: Nurse Practitioner

## 2024-02-05 ENCOUNTER — Other Ambulatory Visit (HOSPITAL_BASED_OUTPATIENT_CLINIC_OR_DEPARTMENT_OTHER): Payer: Self-pay

## 2024-02-05 MED ORDER — LISDEXAMFETAMINE DIMESYLATE 30 MG PO CAPS
30.0000 mg | ORAL_CAPSULE | Freq: Every day | ORAL | 0 refills | Status: DC
  Filled 2024-02-05: qty 30, 30d supply, fill #0

## 2024-02-18 DIAGNOSIS — D229 Melanocytic nevi, unspecified: Secondary | ICD-10-CM | POA: Diagnosis not present

## 2024-03-03 ENCOUNTER — Other Ambulatory Visit: Payer: Self-pay

## 2024-03-03 ENCOUNTER — Other Ambulatory Visit: Payer: Self-pay | Admitting: Nurse Practitioner

## 2024-03-03 ENCOUNTER — Other Ambulatory Visit (HOSPITAL_BASED_OUTPATIENT_CLINIC_OR_DEPARTMENT_OTHER): Payer: Self-pay

## 2024-03-03 MED ORDER — LISDEXAMFETAMINE DIMESYLATE 30 MG PO CAPS
30.0000 mg | ORAL_CAPSULE | Freq: Every day | ORAL | 0 refills | Status: DC
  Filled 2024-03-03 – 2024-03-05 (×2): qty 30, 30d supply, fill #0

## 2024-03-03 NOTE — Telephone Encounter (Signed)
 Requesting: lisdexamfetamine (VYVANSE) 30 MG capsule  Last Visit: 01/28/2024 Next Visit: 04/28/2024 Last Refill: 02/05/2024  Please Advise

## 2024-03-05 ENCOUNTER — Other Ambulatory Visit (HOSPITAL_BASED_OUTPATIENT_CLINIC_OR_DEPARTMENT_OTHER): Payer: Self-pay

## 2024-04-01 ENCOUNTER — Other Ambulatory Visit: Payer: Self-pay | Admitting: Nurse Practitioner

## 2024-04-01 ENCOUNTER — Other Ambulatory Visit (HOSPITAL_BASED_OUTPATIENT_CLINIC_OR_DEPARTMENT_OTHER): Payer: Self-pay

## 2024-04-01 MED ORDER — AIMOVIG 140 MG/ML ~~LOC~~ SOAJ
140.0000 mg | SUBCUTANEOUS | 3 refills | Status: AC
  Filled 2024-04-01 – 2024-05-25 (×2): qty 3, 84d supply, fill #0
  Filled 2024-08-09: qty 3, 84d supply, fill #1
  Filled 2024-08-19: qty 1, 30d supply, fill #1
  Filled 2024-09-14 – 2024-09-20 (×2): qty 1, 30d supply, fill #2
  Filled 2024-09-24 – 2024-10-15 (×3): qty 1, 30d supply, fill #3
  Filled 2024-10-27 – 2024-11-06 (×2): qty 1, 30d supply, fill #4

## 2024-04-01 MED ORDER — LISDEXAMFETAMINE DIMESYLATE 30 MG PO CAPS
30.0000 mg | ORAL_CAPSULE | Freq: Every day | ORAL | 0 refills | Status: DC
  Filled 2024-04-01 – 2024-04-05 (×2): qty 30, 30d supply, fill #0

## 2024-04-01 NOTE — Telephone Encounter (Signed)
 Requesting: Erenumab -aooe (AIMOVIG ) 140 MG/ML SOAJ , lisdexamfetamine (VYVANSE ) 30 MG capsule  Last Visit: 01/28/2024 Next Visit: 04/28/2024 Last Refill: 07/10/2023, 03/03/2024  Please Advise

## 2024-04-05 ENCOUNTER — Other Ambulatory Visit (HOSPITAL_BASED_OUTPATIENT_CLINIC_OR_DEPARTMENT_OTHER): Payer: Self-pay

## 2024-04-28 ENCOUNTER — Encounter: Payer: Self-pay | Admitting: Nurse Practitioner

## 2024-04-28 ENCOUNTER — Other Ambulatory Visit (HOSPITAL_BASED_OUTPATIENT_CLINIC_OR_DEPARTMENT_OTHER): Payer: Self-pay

## 2024-04-28 ENCOUNTER — Ambulatory Visit: Admitting: Nurse Practitioner

## 2024-04-28 VITALS — BP 120/64 | HR 78 | Temp 97.8°F | Ht 67.0 in | Wt 182.2 lb

## 2024-04-28 DIAGNOSIS — F909 Attention-deficit hyperactivity disorder, unspecified type: Secondary | ICD-10-CM

## 2024-04-28 DIAGNOSIS — G8929 Other chronic pain: Secondary | ICD-10-CM | POA: Diagnosis not present

## 2024-04-28 DIAGNOSIS — M5442 Lumbago with sciatica, left side: Secondary | ICD-10-CM

## 2024-04-28 MED ORDER — LISDEXAMFETAMINE DIMESYLATE 40 MG PO CAPS
40.0000 mg | ORAL_CAPSULE | ORAL | 0 refills | Status: DC
  Filled 2024-04-28: qty 30, 30d supply, fill #0

## 2024-04-28 NOTE — Progress Notes (Signed)
 Established Patient Office Visit  Subjective   Patient ID: Melanie Jordan, female    DOB: 1991-09-29  Age: 33 y.o. MRN: [id number]  Chief Complaint  Patient presents with   ADHD    Follow up, discuss changing dosage, concerns with sciatic pain    HPI Discussed the use of AI scribe software for clinical note transcription with the patient, who gave verbal consent to proceed.  History of Present Illness   Melanie Jordan is a 33 year old female who presents with concerns about her Vyvanse  dosage and worsening sciatica pain.  She experiences issues with her current Vyvanse  dosage. The dosage was increased from 20 mg to 30 mg but is no longer effective. She takes it at 8:30 AM, but it wears off before her work shift ends, leading to increased talkativeness and trouble focusing.  She has worsening sciatica pain over the past month, with pain radiating from her lower back to her left foot. Previously localized to her buttock, it now causes cramping and numbness in her lower left leg. Her right side has low back pain, particularly in the SI joint area, which pops when she stands. Stretching provides temporary relief, and ibuprofen  lessens the pain for a few hours. 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 The pain worsened during pregnancy and has not improved since. No fever or incontinence. She reports urgency when drinking a lot of water due to a weak pelvic floor, worsened post-pregnancy.       ROS See pertinent positives and negatives per HPI.    Objective:     BP 120/64 (BP Location: Left Arm, Patient Position: Sitting, Cuff Size: Normal)   Pulse 78   Temp 97.8 F (36.6 C)   Ht 5\' 7"  (1.702 m)   Wt 182 lb 3.2 oz (82.6 kg)   LMP 04/22/2024 (Exact Date)   SpO2 99%   Breastfeeding No   BMI 28.54 kg/m    Physical Exam Vitals and nursing note reviewed.  Constitutional:      General: She is not in acute distress.    Appearance: Normal appearance.   HENT:     Head: Normocephalic.  Eyes:     Conjunctiva/sclera: Conjunctivae normal.  Cardiovascular:     Rate and Rhythm: Normal rate and regular rhythm.     Pulses: Normal pulses.     Heart sounds: Normal heart sounds.  Pulmonary:     Effort: Pulmonary effort is normal.     Breath sounds: Normal breath sounds.  Musculoskeletal:        General: Tenderness (Left paraspinal muscles) present.     Cervical back: Normal range of motion.     Comments: Straight leg raise negative   Skin:    General: Skin is warm.  Neurological:     General: No focal deficit present.     Mental Status: She is alert and oriented to person, place, and time.  Psychiatric:        Mood and Affect: Mood normal.        Behavior: Behavior normal.        Thought Content: Thought content normal.        Judgment: Judgment normal.      Assessment & Plan:   Problem List Items Addressed This Visit       Other   Chronic midline low back pain - Primary   Her sciatica has worsened, with constant pain radiating from the lower back to the left foot, exacerbated by  work. She declined prednisone  due to adverse effects and prefers to try stretches before considering gabapentin. Provide daily back exercises and stretches. Recommend lidocaine  patches and advise using naproxen  for longer-lasting pain relief. Educate on proper body mechanics and lifting techniques.      Attention deficit hyperactivity disorder (ADHD)   The current Vyvanse  dose of 30 mg is ineffective, wearing off before her work shift ends. Increase Vyvanse  to 40 mg daily for better symptom control. PDMP reviewed. Follow-up in 3 months.        Return in about 3 months (around 07/29/2024) for adhd.    Odette Benjamin, NP

## 2024-04-28 NOTE — Assessment & Plan Note (Signed)
 The current Vyvanse  dose of 30 mg is ineffective, wearing off before her work shift ends. Increase Vyvanse  to 40 mg daily for better symptom control. PDMP reviewed. Follow-up in 3 months.

## 2024-04-28 NOTE — Patient Instructions (Signed)
 It was great to see you!  Increase your vyvanse  to 40mg  daily   Start back exercises daily  You can use naproxen  or ibuprofen  as needed for pain.   Let me know if you change your mind about prednisone  or gabapentin   Let's follow-up in 3 months, sooner if you have concerns.  If a referral was placed today, you will be contacted for an appointment. Please note that routine referrals can sometimes take up to 3-4 weeks to process. Please call our office if you haven't heard anything after this time frame.  Take care,  Rheba Cedar, NP

## 2024-04-28 NOTE — Assessment & Plan Note (Signed)
 Her sciatica has worsened, with constant pain radiating from the lower back to the left foot, exacerbated by work. She declined prednisone  due to adverse effects and prefers to try stretches before considering gabapentin. Provide daily back exercises and stretches. Recommend lidocaine  patches and advise using naproxen  for longer-lasting pain relief. Educate on proper body mechanics and lifting techniques.

## 2024-05-13 ENCOUNTER — Other Ambulatory Visit: Payer: Self-pay

## 2024-05-25 ENCOUNTER — Other Ambulatory Visit (HOSPITAL_BASED_OUTPATIENT_CLINIC_OR_DEPARTMENT_OTHER): Payer: Self-pay

## 2024-05-25 ENCOUNTER — Other Ambulatory Visit: Payer: Self-pay | Admitting: Nurse Practitioner

## 2024-05-26 ENCOUNTER — Other Ambulatory Visit (HOSPITAL_BASED_OUTPATIENT_CLINIC_OR_DEPARTMENT_OTHER): Payer: Self-pay

## 2024-05-26 MED ORDER — LISDEXAMFETAMINE DIMESYLATE 40 MG PO CAPS
40.0000 mg | ORAL_CAPSULE | ORAL | 0 refills | Status: DC
  Filled 2024-05-26 – 2024-05-27 (×2): qty 30, 30d supply, fill #0

## 2024-05-26 NOTE — Telephone Encounter (Signed)
 Name of Medication: Vyvanse  40mg  Name of Pharmacy: [facility] Last [place] or Written Date and Quantity: 04/28/24 30caps 0refills Last Office Visit and Type: 04/28/24 for chronic back pain Next Office Visit and Type: 08/04/24 Last Controlled Substance Agreement Date: none  Last UDS: none

## 2024-05-27 ENCOUNTER — Other Ambulatory Visit (HOSPITAL_BASED_OUTPATIENT_CLINIC_OR_DEPARTMENT_OTHER): Payer: Self-pay

## 2024-05-27 ENCOUNTER — Other Ambulatory Visit: Payer: Self-pay

## 2024-06-21 ENCOUNTER — Encounter: Payer: Self-pay | Admitting: Nurse Practitioner

## 2024-06-21 ENCOUNTER — Other Ambulatory Visit: Payer: Self-pay | Admitting: Nurse Practitioner

## 2024-06-21 ENCOUNTER — Other Ambulatory Visit: Payer: Self-pay

## 2024-06-21 ENCOUNTER — Other Ambulatory Visit (HOSPITAL_BASED_OUTPATIENT_CLINIC_OR_DEPARTMENT_OTHER): Payer: Self-pay

## 2024-06-21 DIAGNOSIS — Z0000 Encounter for general adult medical examination without abnormal findings: Secondary | ICD-10-CM

## 2024-06-21 MED ORDER — ALPRAZOLAM 1 MG PO TABS
1.0000 mg | ORAL_TABLET | Freq: Every evening | ORAL | 0 refills | Status: DC | PRN
  Filled 2024-06-21: qty 30, 30d supply, fill #0

## 2024-06-21 NOTE — Telephone Encounter (Signed)
 Requesting: Alprazolam   Last Visit: 04/28/2024 Next Visit: 08/04/2024 Last Refill: 01/28/2024  Please Advise

## 2024-06-22 ENCOUNTER — Encounter (HOSPITAL_BASED_OUTPATIENT_CLINIC_OR_DEPARTMENT_OTHER): Payer: Self-pay

## 2024-06-22 ENCOUNTER — Other Ambulatory Visit (HOSPITAL_BASED_OUTPATIENT_CLINIC_OR_DEPARTMENT_OTHER): Payer: Self-pay

## 2024-06-28 ENCOUNTER — Other Ambulatory Visit: Payer: Self-pay | Admitting: Nurse Practitioner

## 2024-06-28 NOTE — Telephone Encounter (Signed)
 Requesting: lisdexamfetamine (VYVANSE ) 40 MG capsule  Last Visit: 04/28/2024 Next Visit: 08/04/2024 Last Refill: 05/26/2024  Please Advise

## 2024-06-29 ENCOUNTER — Other Ambulatory Visit: Payer: Self-pay

## 2024-06-29 ENCOUNTER — Other Ambulatory Visit (HOSPITAL_BASED_OUTPATIENT_CLINIC_OR_DEPARTMENT_OTHER): Payer: Self-pay

## 2024-06-29 MED ORDER — LISDEXAMFETAMINE DIMESYLATE 40 MG PO CAPS
40.0000 mg | ORAL_CAPSULE | ORAL | 0 refills | Status: DC
  Filled 2024-06-29: qty 30, 30d supply, fill #0

## 2024-07-20 ENCOUNTER — Encounter (HOSPITAL_BASED_OUTPATIENT_CLINIC_OR_DEPARTMENT_OTHER): Payer: Self-pay

## 2024-07-21 ENCOUNTER — Other Ambulatory Visit (HOSPITAL_BASED_OUTPATIENT_CLINIC_OR_DEPARTMENT_OTHER): Payer: Self-pay

## 2024-07-29 ENCOUNTER — Other Ambulatory Visit: Payer: Self-pay | Admitting: Nurse Practitioner

## 2024-07-29 ENCOUNTER — Other Ambulatory Visit (HOSPITAL_BASED_OUTPATIENT_CLINIC_OR_DEPARTMENT_OTHER): Payer: Self-pay

## 2024-07-29 MED ORDER — LISDEXAMFETAMINE DIMESYLATE 40 MG PO CAPS
40.0000 mg | ORAL_CAPSULE | ORAL | 0 refills | Status: DC
  Filled 2024-07-29: qty 30, 30d supply, fill #0

## 2024-07-29 NOTE — Telephone Encounter (Signed)
 Requesting: lisdexamfetamine (VYVANSE ) 40 MG capsule  Last Visit: 04/28/2024 Next Visit: 08/04/2024 Last Refill: 06/29/2024  Please Advise

## 2024-08-04 ENCOUNTER — Ambulatory Visit: Admitting: Nurse Practitioner

## 2024-08-04 ENCOUNTER — Encounter: Payer: Self-pay | Admitting: Nurse Practitioner

## 2024-08-04 ENCOUNTER — Other Ambulatory Visit (HOSPITAL_BASED_OUTPATIENT_CLINIC_OR_DEPARTMENT_OTHER): Payer: Self-pay

## 2024-08-04 VITALS — BP 132/78 | HR 54 | Temp 97.3°F | Ht 67.0 in | Wt 179.2 lb

## 2024-08-04 DIAGNOSIS — F909 Attention-deficit hyperactivity disorder, unspecified type: Secondary | ICD-10-CM

## 2024-08-04 DIAGNOSIS — L299 Pruritus, unspecified: Secondary | ICD-10-CM | POA: Diagnosis not present

## 2024-08-04 DIAGNOSIS — N644 Mastodynia: Secondary | ICD-10-CM | POA: Insufficient documentation

## 2024-08-04 MED ORDER — KETOCONAZOLE 2 % EX SHAM
1.0000 | MEDICATED_SHAMPOO | CUTANEOUS | 0 refills | Status: DC
  Filled 2024-08-04: qty 120, 30d supply, fill #0

## 2024-08-04 NOTE — Progress Notes (Signed)
 Established Patient Office Visit  Subjective   Patient ID: Melanie Jordan, female    DOB: 1991-03-03  Age: 33 y.o. MRN: [id number]  Chief Complaint  Patient presents with   ADHD    Follow up, concerns with right breast pain, itchy scalp for 1.5 week    HPI Discussed the use of AI scribe software for clinical note transcription with the patient, who gave verbal consent to proceed.  History of Present Illness   Melanie Jordan is a 33 year old female who presents with right breast pain and scalp itching.  Right breast pain has persisted for a couple of weeks, localized to one spot and triggered by touch or pressure. There are no lumps, redness, skin changes, or nipple discharge. She denies recent injury or unusual lifting but considers the possibility of an unnoticed impact at work.  Scalp itching began after returning from a trip to [place] , primarily affecting the top and back of the head. A lice comb has shown no lice, and she continues to use daily allergy medication. She has not noticed any redness or rashes.   She is currently taking Vyvanse  and states this is going well, with no concerns.       ROS See pertinent positives and negatives per HPI.    Objective:     BP 132/78 (BP Location: Left Arm, Patient Position: Sitting, Cuff Size: Normal)   Pulse (!) 54   Temp (!) 97.3 F (36.3 C)   Ht 5' 7 (1.702 m)   Wt 179 lb 3.2 oz (81.3 kg)   LMP 07/08/2024 (Exact Date)   SpO2 100%   Breastfeeding No   BMI 28.07 kg/m    Physical Exam Vitals and nursing note reviewed.  Constitutional:      General: She is not in acute distress.    Appearance: Normal appearance.  HENT:     Head: Normocephalic.     Comments: Scalp normal, few dandruff flakes Eyes:     Conjunctiva/sclera: Conjunctivae normal.  Cardiovascular:     Rate and Rhythm: Normal rate and regular rhythm.     Pulses: Normal pulses.     Heart sounds: Normal heart sounds.  Pulmonary:     Effort:  Pulmonary effort is normal.     Breath sounds: Normal breath sounds.  Chest:  Breasts:    Right: Normal. No swelling, bleeding, inverted nipple or mass.     Left: Normal. No swelling, bleeding, inverted nipple or mass.  Musculoskeletal:     Cervical back: Normal range of motion.  Lymphadenopathy:     Upper Body:     Right upper body: No supraclavicular, axillary or pectoral adenopathy.     Left upper body: No supraclavicular, axillary or pectoral adenopathy.  Skin:    General: Skin is warm.  Neurological:     General: No focal deficit present.     Mental Status: She is alert and oriented to person, place, and time.  Psychiatric:        Mood and Affect: Mood normal.        Behavior: Behavior normal.        Thought Content: Thought content normal.        Judgment: Judgment normal.      Assessment & Plan:   Problem List Items Addressed This Visit       Other   Attention deficit hyperactivity disorder (ADHD)   Chronic, stable. Continue vyvanse  40mg  daily.       Breast pain -  Primary   Right breast pain is localized to one spot without redness, skin changes, or discharge. Order a mammogram to evaluate the pain and advise warm compresses for symptomatic relief.      Relevant Orders   MM 3D DIAGNOSTIC MAMMOGRAM BILATERAL BREAST   Other Visit Diagnoses       Itchy scalp       She experiences scalp itching with dandruff, with no lice or rash noted. Start ketoconazole  shampoo twice weekly, bendaryl at bedtime, and zyrtec daily.       Return in about 3 months (around 11/03/2024) for adhd.    Tinnie DELENA Harada, NP

## 2024-08-04 NOTE — Patient Instructions (Signed)
 It was great to see you!  I have ordered a mammogram, they will call to schedule   [facility] [address] [place], [address] [place], [place] [phone]  You can use some warm compresses to the breast that is painful   Start ketoconazole  shampoo twice a week for itching. You can also take benadryl  as needed at bedtime   Let's follow-up in 3 months, sooner if you have concerns.  If a referral was placed today, you will be contacted for an appointment. Please note that routine referrals can sometimes take up to 3-4 weeks to process. Please call our office if you haven't heard anything after this time frame.  Take care,  Tinnie Harada, NP

## 2024-08-04 NOTE — Assessment & Plan Note (Signed)
 Right breast pain is localized to one spot without redness, skin changes, or discharge. Order a mammogram to evaluate the pain and advise warm compresses for symptomatic relief.

## 2024-08-04 NOTE — Assessment & Plan Note (Signed)
 Chronic, stable. Continue vyvanse  40mg  daily.

## 2024-08-05 ENCOUNTER — Other Ambulatory Visit: Payer: Self-pay | Admitting: Nurse Practitioner

## 2024-08-05 DIAGNOSIS — N644 Mastodynia: Secondary | ICD-10-CM

## 2024-08-10 ENCOUNTER — Other Ambulatory Visit (HOSPITAL_BASED_OUTPATIENT_CLINIC_OR_DEPARTMENT_OTHER): Payer: Self-pay

## 2024-08-18 ENCOUNTER — Other Ambulatory Visit

## 2024-08-18 ENCOUNTER — Encounter

## 2024-08-19 ENCOUNTER — Other Ambulatory Visit (HOSPITAL_BASED_OUTPATIENT_CLINIC_OR_DEPARTMENT_OTHER): Payer: Self-pay

## 2024-08-19 ENCOUNTER — Telehealth: Payer: Self-pay

## 2024-08-19 ENCOUNTER — Other Ambulatory Visit (HOSPITAL_COMMUNITY): Payer: Self-pay

## 2024-08-19 NOTE — Telephone Encounter (Signed)
 Pharmacy Patient Advocate Encounter   Received notification from CoverMyMeds that prior authorization for Aimovig  140MG /ML auto-injectors  is required/requested.   Insurance verification completed.   The patient is insured through [facility] .   Per test claim: PA required; PA submitted to above mentioned insurance via Latent Key/confirmation #/EOC [id number] Status is pending

## 2024-08-20 ENCOUNTER — Other Ambulatory Visit (HOSPITAL_BASED_OUTPATIENT_CLINIC_OR_DEPARTMENT_OTHER): Payer: Self-pay

## 2024-08-20 ENCOUNTER — Encounter (HOSPITAL_BASED_OUTPATIENT_CLINIC_OR_DEPARTMENT_OTHER): Payer: Self-pay

## 2024-08-23 ENCOUNTER — Encounter: Payer: Self-pay | Admitting: Nurse Practitioner

## 2024-08-23 ENCOUNTER — Other Ambulatory Visit (HOSPITAL_BASED_OUTPATIENT_CLINIC_OR_DEPARTMENT_OTHER): Payer: Self-pay

## 2024-08-24 ENCOUNTER — Other Ambulatory Visit (HOSPITAL_COMMUNITY): Payer: Self-pay

## 2024-08-24 ENCOUNTER — Other Ambulatory Visit (HOSPITAL_BASED_OUTPATIENT_CLINIC_OR_DEPARTMENT_OTHER): Payer: Self-pay

## 2024-08-24 NOTE — Telephone Encounter (Signed)
 Pharmacy Patient Advocate Encounter  Received notification from [facility] that Prior Authorization for Aimovig  140MG /ML auto-injectors  has been APPROVED from 08/20/2024 to 08/19/2025. Ran test claim, Copay is $125.00. This test claim was processed through [facility]- copay amounts may vary at other pharmacies due to pharmacy/plan contracts, or as the patient moves through the different stages of their insurance plan.   PA #/Case ID/Reference #: [id number]

## 2024-08-25 ENCOUNTER — Ambulatory Visit
Admission: RE | Admit: 2024-08-25 | Discharge: 2024-08-25 | Disposition: A | Discharge status: Home / Self Care | Source: Ambulatory Visit | Attending: Nurse Practitioner | Admitting: Nurse Practitioner

## 2024-08-25 ENCOUNTER — Other Ambulatory Visit (HOSPITAL_BASED_OUTPATIENT_CLINIC_OR_DEPARTMENT_OTHER): Payer: Self-pay

## 2024-08-25 ENCOUNTER — Ambulatory Visit: Payer: Self-pay | Admitting: Nurse Practitioner

## 2024-08-25 ENCOUNTER — Other Ambulatory Visit (HOSPITAL_COMMUNITY): Payer: Self-pay

## 2024-08-25 DIAGNOSIS — N6489 Other specified disorders of breast: Secondary | ICD-10-CM | POA: Diagnosis not present

## 2024-08-25 DIAGNOSIS — R928 Other abnormal and inconclusive findings on diagnostic imaging of breast: Secondary | ICD-10-CM | POA: Diagnosis not present

## 2024-08-25 DIAGNOSIS — N644 Mastodynia: Secondary | ICD-10-CM

## 2024-08-25 NOTE — Telephone Encounter (Signed)
 Patient notified of medication approval via mychart by [employer].

## 2024-08-29 ENCOUNTER — Encounter: Payer: Self-pay | Admitting: Nurse Practitioner

## 2024-08-30 ENCOUNTER — Other Ambulatory Visit: Payer: Self-pay | Admitting: Nurse Practitioner

## 2024-08-30 ENCOUNTER — Other Ambulatory Visit (HOSPITAL_BASED_OUTPATIENT_CLINIC_OR_DEPARTMENT_OTHER): Payer: Self-pay

## 2024-08-30 MED ORDER — LISDEXAMFETAMINE DIMESYLATE 50 MG PO CAPS
50.0000 mg | ORAL_CAPSULE | Freq: Every day | ORAL | 0 refills | Status: DC
  Filled 2024-08-30: qty 30, 30d supply, fill #0

## 2024-08-30 NOTE — Telephone Encounter (Signed)
 Requesting: lisdexamfetamine (VYVANSE ) 40 MG capsule and requesting to increase to 50mg  Last Visit: 08/04/2024 Next Visit: 11/03/2024 Last Refill: 07/29/2024  Please Advise

## 2024-08-31 NOTE — Telephone Encounter (Signed)
 Requesting: ALPRAZolam  (XANAX ) 1 MG tablet  Last Visit: 08/04/2024 Next Visit: 11/03/2024 Last Refill: 06/21/2024  Please Advise

## 2024-09-01 ENCOUNTER — Other Ambulatory Visit (HOSPITAL_BASED_OUTPATIENT_CLINIC_OR_DEPARTMENT_OTHER): Payer: Self-pay

## 2024-09-01 MED ORDER — ALPRAZOLAM 1 MG PO TABS
1.0000 mg | ORAL_TABLET | Freq: Every evening | ORAL | 0 refills | Status: DC | PRN
  Filled 2024-09-01: qty 30, 30d supply, fill #0

## 2024-09-14 ENCOUNTER — Other Ambulatory Visit (HOSPITAL_BASED_OUTPATIENT_CLINIC_OR_DEPARTMENT_OTHER): Payer: Self-pay

## 2024-09-20 ENCOUNTER — Other Ambulatory Visit (HOSPITAL_BASED_OUTPATIENT_CLINIC_OR_DEPARTMENT_OTHER): Payer: Self-pay

## 2024-09-24 ENCOUNTER — Other Ambulatory Visit: Payer: Self-pay | Admitting: Nurse Practitioner

## 2024-09-24 ENCOUNTER — Other Ambulatory Visit (HOSPITAL_BASED_OUTPATIENT_CLINIC_OR_DEPARTMENT_OTHER): Payer: Self-pay

## 2024-09-24 MED ORDER — NORETHINDRONE 0.35 MG PO TABS
1.0000 | ORAL_TABLET | Freq: Every day | ORAL | 3 refills | Status: AC
  Filled 2024-09-24 – 2024-11-06 (×6): qty 84, 84d supply, fill #0

## 2024-09-24 MED ORDER — LISDEXAMFETAMINE DIMESYLATE 50 MG PO CAPS
50.0000 mg | ORAL_CAPSULE | Freq: Every day | ORAL | 0 refills | Status: DC
  Filled 2024-09-24 – 2024-09-30 (×3): qty 30, 30d supply, fill #0

## 2024-09-24 MED ORDER — ALPRAZOLAM 1 MG PO TABS
1.0000 mg | ORAL_TABLET | Freq: Every evening | ORAL | 0 refills | Status: DC | PRN
  Filled 2024-09-24 – 2024-09-30 (×3): qty 30, 30d supply, fill #0

## 2024-09-24 NOTE — Telephone Encounter (Signed)
 Requesting: norethindrone  (INCASSIA ) 0.35 MG tablet , lisdexamfetamine (VYVANSE ) 50 MG capsule ,ALPRAZolam  (XANAX ) 1 MG tablet  Last Visit: 08/04/2024 Next Visit: 11/03/2024 Last Refill: 10/20/2023, 08/30/2024, 09/01/2024  Please Advise

## 2024-09-28 ENCOUNTER — Other Ambulatory Visit (HOSPITAL_BASED_OUTPATIENT_CLINIC_OR_DEPARTMENT_OTHER): Payer: Self-pay

## 2024-09-30 ENCOUNTER — Other Ambulatory Visit (HOSPITAL_BASED_OUTPATIENT_CLINIC_OR_DEPARTMENT_OTHER): Payer: Self-pay

## 2024-10-05 ENCOUNTER — Other Ambulatory Visit (HOSPITAL_BASED_OUTPATIENT_CLINIC_OR_DEPARTMENT_OTHER): Payer: Self-pay

## 2024-10-15 ENCOUNTER — Other Ambulatory Visit (HOSPITAL_BASED_OUTPATIENT_CLINIC_OR_DEPARTMENT_OTHER): Payer: Self-pay

## 2024-10-15 ENCOUNTER — Other Ambulatory Visit: Payer: Self-pay

## 2024-10-20 ENCOUNTER — Encounter: Payer: Self-pay | Admitting: Nurse Practitioner

## 2024-10-20 ENCOUNTER — Ambulatory Visit: Admitting: Nurse Practitioner

## 2024-10-20 ENCOUNTER — Other Ambulatory Visit (HOSPITAL_BASED_OUTPATIENT_CLINIC_OR_DEPARTMENT_OTHER): Payer: Self-pay

## 2024-10-20 VITALS — BP 124/80 | HR 90 | Temp 97.7°F | Ht 67.0 in | Wt 178.8 lb

## 2024-10-20 DIAGNOSIS — F909 Attention-deficit hyperactivity disorder, unspecified type: Secondary | ICD-10-CM

## 2024-10-20 MED ORDER — LISDEXAMFETAMINE DIMESYLATE 50 MG PO CAPS
50.0000 mg | ORAL_CAPSULE | Freq: Every day | ORAL | 0 refills | Status: DC
  Filled 2024-10-20 – 2024-10-27 (×2): qty 30, 30d supply, fill #0

## 2024-10-20 NOTE — Progress Notes (Signed)
   Established Patient Office Visit  Subjective   Patient ID: Melanie Jordan, female    DOB: 04-09-1991  Age: 33 y.o. MRN: [id number]  Chief Complaint  Patient presents with   ADHD    3 month ADHD/meds.  No concerns.     HPI  Discussed the use of AI scribe software for clinical note transcription with the patient, who gave verbal consent to proceed.  History of Present Illness   Melanie Jordan is a 33 year old female who presents for a Vyvanse  refill.  She requests a refill of her Vyvanse  prescription. She has no significant trouble sleeping but has difficulty waking in the morning due to her child's schedule. She denies side effects and the medication is helping with concentration and focus symptoms.        ROS See pertinent positives and negatives per HPI.    Objective:     BP 124/80   Pulse 90   Temp 97.7 F (36.5 C) (Temporal)   Ht 5' 7 (1.702 m)   Wt 178 lb 12.8 oz (81.1 kg)   LMP 09/29/2024   SpO2 100%   BMI 28.00 kg/m    Physical Exam Vitals and nursing note reviewed.  Constitutional:      General: She is not in acute distress.    Appearance: Normal appearance.  HENT:     Head: Normocephalic.  Eyes:     Conjunctiva/sclera: Conjunctivae normal.  Cardiovascular:     Rate and Rhythm: Normal rate and regular rhythm.     Pulses: Normal pulses.     Heart sounds: Normal heart sounds.  Pulmonary:     Effort: Pulmonary effort is normal.     Breath sounds: Normal breath sounds.  Musculoskeletal:     Cervical back: Normal range of motion.  Skin:    General: Skin is warm.  Neurological:     General: No focal deficit present.     Mental Status: She is alert and oriented to person, place, and time.  Psychiatric:        Mood and Affect: Mood normal.        Behavior: Behavior normal.        Thought Content: Thought content normal.        Judgment: Judgment normal.     Assessment & Plan:   Problem List Items Addressed This Visit       Other    Attention deficit hyperactivity disorder (ADHD) - Primary   Chronic, stable. Continue vyvanse  40mg  daily. Refill sent to the pharmacy. PDMP reviewed. Follow-up in 3 months.        Return in about 3 months (around 01/20/2025) for CPE.    Tinnie DELENA Harada, NP

## 2024-10-20 NOTE — Patient Instructions (Signed)
 It was great to see you!  I have refilled your vyvanse   Let me know with your next refill if we need to change pharmacies   Let's follow-up in 3 months, sooner if you have concerns.  If a referral was placed today, you will be contacted for an appointment. Please note that routine referrals can sometimes take up to 3-4 weeks to process. Please call our office if you haven't heard anything after this time frame.  Take care,  Tinnie Harada, NP

## 2024-10-20 NOTE — Assessment & Plan Note (Signed)
 Chronic, stable. Continue vyvanse  40mg  daily. Refill sent to the pharmacy. PDMP reviewed. Follow-up in 3 months.

## 2024-10-28 ENCOUNTER — Other Ambulatory Visit (HOSPITAL_BASED_OUTPATIENT_CLINIC_OR_DEPARTMENT_OTHER): Payer: Self-pay

## 2024-10-28 ENCOUNTER — Other Ambulatory Visit: Payer: Self-pay

## 2024-11-03 ENCOUNTER — Ambulatory Visit: Admitting: Nurse Practitioner

## 2024-11-06 ENCOUNTER — Other Ambulatory Visit: Payer: Self-pay | Admitting: Nurse Practitioner

## 2024-11-08 ENCOUNTER — Other Ambulatory Visit: Payer: Self-pay

## 2024-11-08 ENCOUNTER — Other Ambulatory Visit (HOSPITAL_BASED_OUTPATIENT_CLINIC_OR_DEPARTMENT_OTHER): Payer: Self-pay

## 2024-11-08 MED ORDER — ALPRAZOLAM 1 MG PO TABS
1.0000 mg | ORAL_TABLET | Freq: Every evening | ORAL | 0 refills | Status: DC | PRN
  Filled 2024-11-08: qty 30, 30d supply, fill #0

## 2024-11-08 NOTE — Telephone Encounter (Signed)
 Requesting: ALPRAZolam  (XANAX ) 1 MG tablet  Last Visit: 10/20/2024 Next Visit: 02/09/2025 Last Refill: 09/24/2024  Please Advise

## 2024-11-11 ENCOUNTER — Other Ambulatory Visit (HOSPITAL_BASED_OUTPATIENT_CLINIC_OR_DEPARTMENT_OTHER): Payer: Self-pay

## 2024-11-13 ENCOUNTER — Encounter: Payer: Self-pay | Admitting: Nurse Practitioner

## 2024-11-15 MED ORDER — LISDEXAMFETAMINE DIMESYLATE 50 MG PO CAPS: 50.0000 mg | ORAL_CAPSULE | Freq: Every day | ORAL | 0 refills | Status: DC

## 2024-11-15 MED ORDER — ALPRAZOLAM 1 MG PO TABS: 1.0000 mg | ORAL_TABLET | Freq: Every evening | ORAL | 0 refills | Status: DC | PRN

## 2024-11-15 NOTE — Telephone Encounter (Signed)
 Requesting: lisdexamfetamine  (VYVANSE ) 50 MG capsule ALPRAZolam  (XANAX ) 1 MG tablet  Last Visit: 10/20/2024 Next Visit: 02/09/2025 Last Refill: 10/20/24, 11/08/2024  Please Advise

## 2024-11-30 ENCOUNTER — Other Ambulatory Visit (HOSPITAL_COMMUNITY): Payer: Self-pay

## 2024-11-30 ENCOUNTER — Telehealth: Payer: Self-pay

## 2024-11-30 NOTE — Telephone Encounter (Signed)
 I called and spoke with pharmacy and patient has changed insurances and will need to get an updated card on our end to get a prior auth done.   I called patient and left a detailed message to upload new insurance card via [employer] or bring by the office.

## 2024-11-30 NOTE — Telephone Encounter (Signed)
 Per chart media, patient has approval till next year on medimpact. Has patient provided us  with new insurance information? The verify RX benefits tab did not pull any benefits.

## 2024-11-30 NOTE — Telephone Encounter (Signed)
 Received fax from pharmacy.  Can we get a prior auth on Erenumab -aooe (AIMOVIG ) 140 MG/ML SOAJ ?

## 2024-12-04 ENCOUNTER — Telehealth: Admitting: Family Medicine

## 2024-12-04 DIAGNOSIS — K047 Periapical abscess without sinus: Secondary | ICD-10-CM | POA: Diagnosis not present

## 2024-12-04 MED ORDER — AMOXICILLIN 875 MG PO TABS: 875.0000 mg | ORAL_TABLET | Freq: Two times a day (BID) | ORAL | 0 refills | Status: AC

## 2024-12-04 MED ORDER — PENICILLIN V POTASSIUM 500 MG PO TABS: 500.0000 mg | ORAL_TABLET | Freq: Four times a day (QID) | ORAL | 0 refills | Status: AC

## 2024-12-04 NOTE — Progress Notes (Signed)
 E-Visit for Dental Pain  We are sorry that you are not feeling well.  Here is how we plan to help!  Based on what you have shared with me in the questionnaire, it sounds like you have a dental infection.   Pen VK 500mg  3 times a day for 7 days  It is imperative that you see a dentist within 10 days of this eVisit to determine the cause of the dental pain and be sure it is adequately treated  A toothache or tooth pain is caused when the nerve in the root of a tooth or surrounding a tooth is irritated. Dental (tooth) infection, decay, injury, or loss of a tooth are the most common causes of dental pain. Pain may also occur after an extraction (tooth is pulled out). Pain sometimes originates from other areas and radiates to the jaw, thus appearing to be tooth pain.Bacteria growing inside your mouth can contribute to gum disease and dental decay, both of which can cause pain. A toothache occurs from inflammation of the central portion of the tooth called pulp. The pulp contains nerve endings that are very sensitive to pain. Inflammation to the pulp or pulpitis may be caused by dental cavities, trauma, and infection.    HOME CARE:   For toothaches: Over-the-counter pain medications such as acetaminophen or ibuprofen  may be used. Take these as directed on the package while you arrange for a dental appointment. Avoid very cold or hot foods, because they may make the pain worse. You may get relief from biting on a cotton ball soaked in oil of cloves. You can get oil of cloves at most drug stores.  For jaw pain:  Aspirin may be helpful for problems in the joint of the jaw in adults. If pain happens every time you open your mouth widely, the temporomandibular joint (TMJ) may be the source of the pain. Yawning or taking a large bite of food may worsen the pain. An appointment with your doctor or dentist will help you find the cause.     GET HELP RIGHT AWAY IF:  You have a high fever or chills If  you have had a recent head or face injury and develop headache, light headedness, nausea, vomiting, or other symptoms that concern you after an injury to your face or mouth, you could have a more serious injury in addition to your dental injury. A facial rash associated with a toothache: This condition may improve with medication. Contact your doctor for them to decide what is appropriate. Any jaw pain occurring with chest pain: Although jaw pain is most commonly caused by dental disease, it is sometimes referred pain from other areas. People with heart disease, especially people who have had stents placed, people with diabetes, or those who have had heart surgery may have jaw pain as a symptom of heart attack or angina. If your jaw or tooth pain is associated with lightheadedness, sweating, or shortness of breath, you should see a doctor as soon as possible. Trouble swallowing or excessive pain or bleeding from gums: If you have a history of a weakened immune system, diabetes, or steroid use, you may be more susceptible to infections. Infections can often be more severe and extensive or caused by unusual organisms. Dental and gum infections in people with these conditions may require more aggressive treatment. An abscess may need draining or IV antibiotics, for example.  MAKE SURE YOU   Understand these instructions. Will watch your condition. Will get help right away  if you are not doing well or get worse.  Thank you for choosing an e-visit.  Your e-visit answers were reviewed by a board certified advanced clinical practitioner to complete your personal care plan. Depending upon the condition, your plan could have included both over the counter or prescription medications.  Please review your pharmacy choice. Make sure the pharmacy is open so you can pick up prescription now. If there is a problem, you may contact your provider through [employer] and have the prescription routed to another  pharmacy.  Your safety is important to us . If you have drug allergies check your prescription carefully.   For the next 24 hours you can use MyChart to ask questions about today's visit, request a non-urgent call back, or ask for a work or school excuse. You will get an email in the next two days asking about your experience. I hope that your e-visit has been valuable and will speed your recovery.  I have spent 5 minutes in review of e-visit questionnaire, review and updating patient chart, medical decision making and response to patient.   Anica Alcaraz, FNP

## 2024-12-07 ENCOUNTER — Other Ambulatory Visit (HOSPITAL_COMMUNITY): Payer: Self-pay

## 2024-12-07 NOTE — Telephone Encounter (Signed)
 Pharmacy Patient Advocate Encounter   Received notification from Physician's Office that prior authorization for AIMOVIG  is required/requested.   Insurance verification completed.   The patient is insured through [employer].   Per test claim: PA required; PA submitted to above mentioned insurance via Latent Key/confirmation #/EOC [id number] Status is pending'

## 2024-12-10 ENCOUNTER — Other Ambulatory Visit (HOSPITAL_COMMUNITY): Payer: Self-pay

## 2024-12-10 NOTE — Telephone Encounter (Signed)
 Pharmacy Patient Advocate Encounter   Received notification from Physician's Office that prior authorization for AIMOVIG  is required/requested.   Insurance verification completed.   The patient is insured through [employer].   Per test claim: PA required; PA submitted to above mentioned insurance via Latent Key/confirmation #/EOC [id number] Status is pending

## 2024-12-10 NOTE — Telephone Encounter (Signed)
 Noted. Pending prior auth.

## 2024-12-15 NOTE — Telephone Encounter (Signed)
 Pharmacy Patient Advocate Encounter  Received notification from [facility] [employer] that Prior Authorization for AIMOVIG  has been APPROVED from 12/07/24 to 06/06/25

## 2024-12-15 NOTE — Telephone Encounter (Signed)
 I called and spoke with pharmacy tech at [facility] and notified him that Aimovig  has been approved and Rx will be in to the pharmacy tomorrow.

## 2024-12-29 ENCOUNTER — Other Ambulatory Visit (HOSPITAL_BASED_OUTPATIENT_CLINIC_OR_DEPARTMENT_OTHER): Payer: Self-pay

## 2024-12-29 MED ORDER — LISDEXAMFETAMINE DIMESYLATE 50 MG PO CAPS
50.0000 mg | ORAL_CAPSULE | Freq: Every day | ORAL | 0 refills | Status: AC
  Filled 2024-12-29: qty 30, 30d supply, fill #0

## 2024-12-29 MED ORDER — ALPRAZOLAM 1 MG PO TABS
1.0000 mg | ORAL_TABLET | Freq: Every evening | ORAL | 0 refills | Status: AC | PRN
  Filled 2024-12-29: qty 30, 30d supply, fill #0

## 2024-12-29 NOTE — Addendum Note (Signed)
 Addended by: GLADIS CLAUDENE GRATE Y on: 12/29/2024 09:06 AM   Modules accepted: Orders

## 2024-12-29 NOTE — Telephone Encounter (Signed)
 Requesting: ALPRAZolam  (XANAX ) 1 MG tablet , lisdexamfetamine  (VYVANSE ) 50 MG capsule  Last Visit: 10/20/2024 Next Visit: 02/09/2025 Last Refill: 12/06/2024, 11/25/2024  Please Advise

## 2024-12-29 NOTE — Telephone Encounter (Signed)
 I called [facility] and Rx of Xanax  is waiting for pick up and I asked pharmacist to cancel Rx and Rx of Vyvanse  was not received.

## 2025-02-09 ENCOUNTER — Encounter: Admitting: Nurse Practitioner
